# Patient Record
Sex: Female | Born: 1950 | Race: White | Hispanic: No | Marital: Married | Smoking: Former smoker
Health system: Southern US, Community
[De-identification: ages and names within clinical notes are randomized; demographics above are authoritative.]

## PROBLEM LIST (undated history)

## (undated) DIAGNOSIS — IMO0001 Reserved for inherently not codable concepts without codable children: Secondary | ICD-10-CM

## (undated) DIAGNOSIS — R519 Headache, unspecified: Secondary | ICD-10-CM

## (undated) DIAGNOSIS — M1990 Unspecified osteoarthritis, unspecified site: Secondary | ICD-10-CM

## (undated) DIAGNOSIS — Z8719 Personal history of other diseases of the digestive system: Secondary | ICD-10-CM

## (undated) DIAGNOSIS — T451X5A Adverse effect of antineoplastic and immunosuppressive drugs, initial encounter: Secondary | ICD-10-CM

## (undated) DIAGNOSIS — Z9289 Personal history of other medical treatment: Secondary | ICD-10-CM

## (undated) DIAGNOSIS — K589 Irritable bowel syndrome without diarrhea: Secondary | ICD-10-CM

## (undated) DIAGNOSIS — C3490 Malignant neoplasm of unspecified part of unspecified bronchus or lung: Secondary | ICD-10-CM

## (undated) DIAGNOSIS — Z5111 Encounter for antineoplastic chemotherapy: Secondary | ICD-10-CM

## (undated) DIAGNOSIS — F329 Major depressive disorder, single episode, unspecified: Secondary | ICD-10-CM

## (undated) DIAGNOSIS — D6481 Anemia due to antineoplastic chemotherapy: Secondary | ICD-10-CM

## (undated) DIAGNOSIS — R51 Headache: Secondary | ICD-10-CM

## (undated) DIAGNOSIS — F418 Other specified anxiety disorders: Secondary | ICD-10-CM

## (undated) DIAGNOSIS — F32A Depression, unspecified: Secondary | ICD-10-CM

## (undated) DIAGNOSIS — M353 Polymyalgia rheumatica: Secondary | ICD-10-CM

## (undated) DIAGNOSIS — J189 Pneumonia, unspecified organism: Secondary | ICD-10-CM

## (undated) DIAGNOSIS — IMO0002 Reserved for concepts with insufficient information to code with codable children: Secondary | ICD-10-CM

## (undated) DIAGNOSIS — I10 Essential (primary) hypertension: Secondary | ICD-10-CM

## (undated) DIAGNOSIS — E785 Hyperlipidemia, unspecified: Secondary | ICD-10-CM

## (undated) HISTORY — DX: Polymyalgia rheumatica: M35.3

## (undated) HISTORY — DX: Irritable bowel syndrome, unspecified: K58.9

## (undated) HISTORY — DX: Hyperlipidemia, unspecified: E78.5

## (undated) HISTORY — DX: Reserved for concepts with insufficient information to code with codable children: IMO0002

## (undated) HISTORY — DX: Anemia due to antineoplastic chemotherapy: D64.81

## (undated) HISTORY — DX: Adverse effect of antineoplastic and immunosuppressive drugs, initial encounter: T45.1X5A

## (undated) HISTORY — DX: Reserved for inherently not codable concepts without codable children: IMO0001

## (undated) HISTORY — DX: Depression, unspecified: F32.A

## (undated) HISTORY — PX: WISDOM TOOTH EXTRACTION: SHX21

## (undated) HISTORY — DX: Other specified anxiety disorders: F41.8

## (undated) HISTORY — DX: Major depressive disorder, single episode, unspecified: F32.9

## (undated) HISTORY — PX: COLONOSCOPY: SHX174

## (undated) HISTORY — DX: Encounter for antineoplastic chemotherapy: Z51.11

---

## 1998-06-07 ENCOUNTER — Other Ambulatory Visit: Admission: RE | Admit: 1998-06-07 | Discharge: 1998-06-07 | Payer: Self-pay | Admitting: Gynecology

## 1999-06-21 ENCOUNTER — Other Ambulatory Visit: Admission: RE | Admit: 1999-06-21 | Discharge: 1999-06-21 | Payer: Self-pay | Admitting: Obstetrics and Gynecology

## 2000-12-12 ENCOUNTER — Other Ambulatory Visit: Admission: RE | Admit: 2000-12-12 | Discharge: 2000-12-12 | Payer: Self-pay | Admitting: Obstetrics and Gynecology

## 2002-05-21 ENCOUNTER — Other Ambulatory Visit: Admission: RE | Admit: 2002-05-21 | Discharge: 2002-05-21 | Payer: Self-pay | Admitting: Obstetrics and Gynecology

## 2003-10-20 ENCOUNTER — Other Ambulatory Visit: Admission: RE | Admit: 2003-10-20 | Discharge: 2003-10-20 | Payer: Self-pay | Admitting: Obstetrics and Gynecology

## 2004-09-12 ENCOUNTER — Ambulatory Visit (HOSPITAL_COMMUNITY): Admission: RE | Admit: 2004-09-12 | Discharge: 2004-09-12 | Payer: Self-pay | Admitting: Gastroenterology

## 2005-01-24 ENCOUNTER — Other Ambulatory Visit: Admission: RE | Admit: 2005-01-24 | Discharge: 2005-01-24 | Payer: Self-pay | Admitting: Obstetrics and Gynecology

## 2006-05-16 ENCOUNTER — Inpatient Hospital Stay (HOSPITAL_COMMUNITY): Admission: EM | Admit: 2006-05-16 | Discharge: 2006-05-18 | Payer: Self-pay | Admitting: Emergency Medicine

## 2007-07-09 ENCOUNTER — Encounter: Admission: RE | Admit: 2007-07-09 | Discharge: 2007-07-09 | Payer: Self-pay | Admitting: Family Medicine

## 2007-07-21 ENCOUNTER — Encounter: Admission: RE | Admit: 2007-07-21 | Discharge: 2007-08-05 | Payer: Self-pay | Admitting: Family Medicine

## 2011-02-15 NOTE — Op Note (Signed)
[name], Tracy Dodson                ACCOUNT NO.:  [id number]   MEDICAL RECORD NO.:  [id number]          PATIENT TYPE:  AMB   LOCATION:  ENDO                         FACILITY:  [facility]   PHYSICIAN:  John C. Madilyn Fireman, M.D.    DATE OF BIRTH:  1951/09/14   DATE OF PROCEDURE:  09/13/2003  DATE OF DISCHARGE:                                 OPERATIVE REPORT   PROCEDURE:  Colonoscopy.   INDICATIONS FOR PROCEDURE:  Average-risk colon cancer screening.   PROCEDURE:  The patient was placed in the left lateral decubitus position  and placed on the pulse monitor with continuous low flow oxygen delivered by  nasal cannula.  She was sedated with 100 mcg IV fentanyl and 10 mg IV  Versed.  The Olympus video colonoscope is inserted into the rectum and  advanced to the cecum, confirmed by transillumination at McBurney's point  and visualization of the ileocecal valve and appendiceal orifice.  Prep was  excellent.  The cecum, ascending, transverse, descending, and sigmoid colon  all appeared normal with no masses, polyps, diverticula, or other mucosal  abnormalities.  The rectum likewise appeared normal, and retroflexed view of  the anus revealed no obvious internal hemorrhoids.  The scope was then  withdrawn, and the patient returned to the recovery room in stable  condition.  She tolerated the procedure well, and there were no immediate  complications.   IMPRESSION:  Normal colonoscopy.   PLAN:  Next colon screening by sigmoidoscopy in five years.   IMPRESSION:  Normal colonoscopy.   PLAN:  Repeat study in five years.      JCH/MEDQ  D:  09/12/2004  T:  09/12/2004  Job:  [id number]   cc:   Chales Salmon. Abigail Miyamoto, M.D.  [address]  [place]  [place] [address]  Fax: [phone]
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
## 2011-10-08 ENCOUNTER — Other Ambulatory Visit: Payer: Self-pay | Admitting: Obstetrics and Gynecology

## 2011-10-08 DIAGNOSIS — N644 Mastodynia: Secondary | ICD-10-CM

## 2011-10-14 ENCOUNTER — Ambulatory Visit
Admission: RE | Admit: 2011-10-14 | Discharge: 2011-10-14 | Disposition: A | Discharge status: Home / Self Care | Payer: PRIVATE HEALTH INSURANCE | Source: Ambulatory Visit | Attending: Obstetrics and Gynecology | Admitting: Obstetrics and Gynecology

## 2011-10-14 DIAGNOSIS — N644 Mastodynia: Secondary | ICD-10-CM

## 2011-11-07 ENCOUNTER — Other Ambulatory Visit: Payer: Self-pay | Admitting: Obstetrics and Gynecology

## 2011-11-07 ENCOUNTER — Ambulatory Visit
Admission: RE | Admit: 2011-11-07 | Discharge: 2011-11-07 | Disposition: A | Discharge status: Home / Self Care | Payer: PRIVATE HEALTH INSURANCE | Source: Ambulatory Visit | Attending: Obstetrics and Gynecology | Admitting: Obstetrics and Gynecology

## 2011-11-07 DIAGNOSIS — R0781 Pleurodynia: Secondary | ICD-10-CM

## 2012-04-17 ENCOUNTER — Ambulatory Visit (INDEPENDENT_AMBULATORY_CARE_PROVIDER_SITE_OTHER): Payer: PRIVATE HEALTH INSURANCE | Admitting: Family Medicine

## 2012-04-17 ENCOUNTER — Ambulatory Visit: Payer: PRIVATE HEALTH INSURANCE

## 2012-04-17 VITALS — BP 150/82 | HR 68 | Temp 98.4°F | Resp 16 | Ht 65.5 in | Wt 169.0 lb

## 2012-04-17 DIAGNOSIS — M25561 Pain in right knee: Secondary | ICD-10-CM

## 2012-04-17 DIAGNOSIS — M25569 Pain in unspecified knee: Secondary | ICD-10-CM

## 2012-04-17 DIAGNOSIS — S86919A Strain of unspecified muscle(s) and tendon(s) at lower leg level, unspecified leg, initial encounter: Secondary | ICD-10-CM

## 2012-04-17 MED ORDER — NABUMETONE 500 MG PO TABS: 500.0000 mg | ORAL_TABLET | Freq: Two times a day (BID) | ORAL | Status: DC

## 2012-04-17 NOTE — Patient Instructions (Signed)
Use ice 3-4 times daily for 5 days.  Relafen twice daily with food  Return if not improving.

## 2012-04-17 NOTE — Progress Notes (Signed)
Subjective: 61 year old lady with history of having been walking or stairs at home yesterday and injured her right knee. It felt like something gave way a little bit there. He continues to feel unstable to her with some pain. It's not totally consistent. She has tenderness in the back of the knee. She has not had previous knee problems like this, though she has been told she had some arthritis. She is on treatment for polymyalgia rheumatica, which is improved. She has not been feeling any popping or cracking in the knee.  Objective: Moderately overweight lady in no acute distress. When she ambulates she is able to do so with a fairly smooth gait right now, because it is not hurting her a lot right now. There is no obvious effusion of the knee. She has some moderate tenderness of the lateral aspect of the popliteal fossa on the right. The knee has no palpable effusion. No popping or clicking can be felt. The kneecap isn't focal in good position. Flexion and extension are good. She'll joint ligaments seem to be intact.  Assessment:  Right knee pain Right knee strain Possibly ruptured a baker's cyst  Plan: X-ray right knee [facility] reading (PRIMARY) by  Dr. Alwyn Ren Mild degenerative changes  Take RElafen 500 bid [place] cautious at the beach.  Return if sx persist.

## 2012-08-24 ENCOUNTER — Ambulatory Visit
Admission: RE | Admit: 2012-08-24 | Discharge: 2012-08-24 | Disposition: A | Discharge status: Home / Self Care | Payer: PRIVATE HEALTH INSURANCE | Source: Ambulatory Visit | Attending: Rheumatology | Admitting: Rheumatology

## 2012-08-24 ENCOUNTER — Other Ambulatory Visit: Payer: Self-pay | Admitting: Rheumatology

## 2012-08-24 DIAGNOSIS — J984 Other disorders of lung: Secondary | ICD-10-CM

## 2012-08-31 ENCOUNTER — Other Ambulatory Visit: Payer: Self-pay | Admitting: Rheumatology

## 2012-08-31 DIAGNOSIS — R911 Solitary pulmonary nodule: Secondary | ICD-10-CM

## 2012-09-03 ENCOUNTER — Ambulatory Visit
Admission: RE | Admit: 2012-09-03 | Discharge: 2012-09-03 | Disposition: A | Discharge status: Home / Self Care | Payer: PRIVATE HEALTH INSURANCE | Source: Ambulatory Visit | Attending: Rheumatology | Admitting: Rheumatology

## 2012-09-03 DIAGNOSIS — R911 Solitary pulmonary nodule: Secondary | ICD-10-CM

## 2012-09-03 MED ORDER — IOHEXOL 300 MG/ML  SOLN
75.0000 mL | Freq: Once | INTRAMUSCULAR | Status: AC | PRN
  Administered 2012-09-03: 75 mL via INTRAVENOUS

## 2012-09-15 ENCOUNTER — Encounter: Payer: Self-pay | Admitting: Internal Medicine

## 2012-09-16 ENCOUNTER — Ambulatory Visit (INDEPENDENT_AMBULATORY_CARE_PROVIDER_SITE_OTHER): Payer: PRIVATE HEALTH INSURANCE | Admitting: Internal Medicine

## 2012-09-16 ENCOUNTER — Encounter: Payer: Self-pay | Admitting: Internal Medicine

## 2012-09-16 VITALS — BP 130/80 | HR 81 | Temp 98.0°F | Ht 66.0 in | Wt 164.0 lb

## 2012-09-16 DIAGNOSIS — R911 Solitary pulmonary nodule: Secondary | ICD-10-CM

## 2012-09-16 DIAGNOSIS — F17200 Nicotine dependence, unspecified, uncomplicated: Secondary | ICD-10-CM

## 2012-09-16 NOTE — Patient Instructions (Addendum)
Please see patient coordinator before you leave today  to schedule PET scan and I will call when results are avaialbe  Make every effort to stop smoking completely if at all possible

## 2012-09-16 NOTE — Progress Notes (Signed)
  Subjective:    Patient ID: Tracy Dodson, female    DOB: [birth year]  MRN: [id number]  HPI   72 yowf active smoker with polymyalgia since June 2011 with increase bilateral shoulder pain leading to shoulder xray > RML lung nodule not present 11/07/11    09/16/2012 1st pulmonary eval cc min dry cough x months, worse in cold weather, no pleuritic cp, limiting sob or h/o RA or previous dx of Ca.  Sleeping ok without nocturnal  or early am exacerbation  of respiratory  c/o's or need for noct saba. Also denies any obvious fluctuation of symptoms with weather or environmental changes or other aggravating or alleviating factors except as outlined above    Review of Systems  Constitutional: Negative for fever, chills and unexpected weight change.  HENT: Negative for ear pain, nosebleeds, congestion, sore throat, rhinorrhea, sneezing, trouble swallowing, dental problem, voice change, postnasal drip and sinus pressure.   Eyes: Negative for visual disturbance.  Respiratory: Positive for cough. Negative for choking and shortness of breath.   Cardiovascular: Negative for chest pain and leg swelling.  Gastrointestinal: Negative for vomiting, abdominal pain and diarrhea.  Genitourinary: Negative for difficulty urinating.  Musculoskeletal: Positive for arthralgias.  Skin: Negative for rash.  Neurological: Negative for tremors, syncope and headaches.  Hematological: Does not bruise/bleed easily.       Objective:   Physical Exam Wt Readings from Last 3 Encounters:  04/17/12 169 lb (76.658 kg)   Anxious pleasant wf nad HEENT: nl dentition, turbinates, and orophanx. Nl external ear canals without cough reflex   NECK :  without JVD/Nodes/TM/ nl carotid upstrokes bilaterally   LUNGS: no acc muscle use, clear to A and P bilaterally without cough on insp or exp maneuvers   CV:  RRR  no s3 or murmur or increase in P2, no edema   ABD:  soft and nontender with nl excursion in the supine position. No  bruits or organomegaly, bowel sounds nl  MS:  warm without deformities, calf tenderness, cyanosis or clubbing  SKIN: warm and dry without lesions    NEURO:  alert, approp, no deficits    Ct 09/03/12 1. 1.9 CM SPICULATED RIGHT MIDDLE LOBE NODULE, HIGHLY SUSPICIOUS  FOR MALIGNANCY. TISSUE DIAGNOSIS AND / OR PET CT RECOMMENDED.  2. A precarinal node is at the upper limits of normal in size.       Assessment & Plan:

## 2012-09-17 ENCOUNTER — Encounter: Payer: Self-pay | Admitting: Internal Medicine

## 2012-09-17 NOTE — Progress Notes (Signed)
Quick Note:  Spoke with pt and notified of results per Dr. Wert. Pt verbalized understanding and denied any questions.  ______ 

## 2012-09-17 NOTE — Assessment & Plan Note (Signed)
-   See CT chest 09/03/12   This is def new from previous nl cxr 11/07/11  So is resectable lung ca until proven otherwise in a pt who appears to be excellent surgical candidate for rmlobectomy but needs to quit smoking for 2 weeks first.  Discussed in detail all the  indications, usual  risks and alternatives  relative to the benefits with patient who agrees to proceed with PET then excisional bx if Stage I - IIIa

## 2012-09-17 NOTE — Assessment & Plan Note (Signed)
Advised to quit 09/16/12 pulmonary ov - PFT's wnl 09/16/12  I reviewed the Flethcher curve with patient that basically indicates  if you quit smoking when your best day FEV1 is still well preserved (as hers is) it is highly unlikely you will progress to severe disease and informed the patient there was no medication on the market that has proven to change the curve or the likelihood of progression.  Therefore stopping smoking and maintaining abstinence is the most important aspect of care, not choice of inhalers or for that matter, doctors.   She will also need to quit smoking at least 2 weeks preop to reduce post op risk, reviewed.

## 2012-09-24 ENCOUNTER — Encounter (HOSPITAL_COMMUNITY)
Admission: RE | Admit: 2012-09-24 | Discharge: 2012-09-24 | Disposition: A | Discharge status: Home / Self Care | Payer: PRIVATE HEALTH INSURANCE | Source: Ambulatory Visit | Attending: Internal Medicine | Admitting: Internal Medicine

## 2012-09-24 ENCOUNTER — Encounter: Payer: Self-pay | Admitting: Internal Medicine

## 2012-09-24 DIAGNOSIS — R911 Solitary pulmonary nodule: Secondary | ICD-10-CM

## 2012-09-24 DIAGNOSIS — C771 Secondary and unspecified malignant neoplasm of intrathoracic lymph nodes: Secondary | ICD-10-CM | POA: Insufficient documentation

## 2012-09-24 DIAGNOSIS — D3500 Benign neoplasm of unspecified adrenal gland: Secondary | ICD-10-CM | POA: Insufficient documentation

## 2012-09-24 DIAGNOSIS — K8020 Calculus of gallbladder without cholecystitis without obstruction: Secondary | ICD-10-CM | POA: Insufficient documentation

## 2012-09-24 LAB — GLUCOSE, CAPILLARY: Glucose-Capillary: 94 mg/dL (ref 70–99)

## 2012-09-24 MED ORDER — FLUDEOXYGLUCOSE F - 18 (FDG) INJECTION
16.8000 | Freq: Once | INTRAVENOUS | Status: AC | PRN
  Administered 2012-09-24: 16.8 via INTRAVENOUS

## 2012-09-25 ENCOUNTER — Other Ambulatory Visit: Payer: Self-pay | Admitting: Emergency Medicine

## 2012-09-25 DIAGNOSIS — R911 Solitary pulmonary nodule: Secondary | ICD-10-CM

## 2012-09-28 ENCOUNTER — Encounter (HOSPITAL_COMMUNITY): Payer: Self-pay | Admitting: *Deleted

## 2012-09-28 ENCOUNTER — Telehealth: Payer: Self-pay | Admitting: Emergency Medicine

## 2012-09-28 ENCOUNTER — Encounter (HOSPITAL_COMMUNITY): Payer: Self-pay | Admitting: Pharmacy Technician

## 2012-09-28 NOTE — Telephone Encounter (Signed)
Called spoke with Jan who is requesting specific orders for pt's bronch be placed.  RB please advise, thanks.

## 2012-09-28 NOTE — Telephone Encounter (Signed)
Done

## 2012-09-29 ENCOUNTER — Ambulatory Visit (HOSPITAL_COMMUNITY): Payer: PRIVATE HEALTH INSURANCE

## 2012-09-29 ENCOUNTER — Ambulatory Visit (HOSPITAL_COMMUNITY): Payer: PRIVATE HEALTH INSURANCE | Admitting: Anesthesiology

## 2012-09-29 ENCOUNTER — Encounter (HOSPITAL_COMMUNITY): Payer: Self-pay | Admitting: *Deleted

## 2012-09-29 ENCOUNTER — Encounter (HOSPITAL_COMMUNITY): Payer: Self-pay | Admitting: Anesthesiology

## 2012-09-29 ENCOUNTER — Encounter (HOSPITAL_COMMUNITY)
Admission: RE | Disposition: A | Discharge status: Home / Self Care | Payer: Self-pay | Source: Ambulatory Visit | Attending: Emergency Medicine

## 2012-09-29 ENCOUNTER — Encounter (HOSPITAL_COMMUNITY): Payer: Self-pay | Admitting: Emergency Medicine

## 2012-09-29 ENCOUNTER — Ambulatory Visit (HOSPITAL_COMMUNITY)
Admission: RE | Admit: 2012-09-29 | Discharge: 2012-09-29 | Disposition: A | Discharge status: Home / Self Care | Payer: PRIVATE HEALTH INSURANCE | Source: Ambulatory Visit | Attending: Emergency Medicine | Admitting: Emergency Medicine

## 2012-09-29 ENCOUNTER — Other Ambulatory Visit: Payer: Self-pay

## 2012-09-29 DIAGNOSIS — R911 Solitary pulmonary nodule: Secondary | ICD-10-CM

## 2012-09-29 DIAGNOSIS — F17200 Nicotine dependence, unspecified, uncomplicated: Secondary | ICD-10-CM | POA: Insufficient documentation

## 2012-09-29 DIAGNOSIS — R599 Enlarged lymph nodes, unspecified: Secondary | ICD-10-CM

## 2012-09-29 HISTORY — PX: VIDEO BRONCHOSCOPY WITH ENDOBRONCHIAL ULTRASOUND: SHX6177

## 2012-09-29 HISTORY — DX: Unspecified osteoarthritis, unspecified site: M19.90

## 2012-09-29 HISTORY — DX: Essential (primary) hypertension: I10

## 2012-09-29 LAB — COMPREHENSIVE METABOLIC PANEL
ALT: 12 U/L (ref 0–35)
Alkaline Phosphatase: 74 U/L (ref 39–117)
BUN: 15 mg/dL (ref 6–23)
CO2: 26 mEq/L (ref 19–32)
Chloride: 97 mEq/L (ref 96–112)
GFR calc Af Amer: >90 mL/min (ref >90)
GFR calc non Af Amer: >90 mL/min (ref >90)
Glucose, Bld: 117 mg/dL — ABNORMAL HIGH (ref 70–99)
Potassium: 4 mEq/L (ref 3.5–5.1)
Sodium: 136 mEq/L (ref 135–145)
Total Bilirubin: 0.3 mg/dL (ref 0.3–1.2)
Total Protein: 7.7 g/dL (ref 6.0–8.3)

## 2012-09-29 LAB — PROTIME-INR: Prothrombin Time: 12.9 seconds (ref 11.6–15.2)

## 2012-09-29 LAB — CBC
MCH: 26.5 pg (ref 26.0–34.0)
MCV: 84.4 fL (ref 78.0–100.0)
WBC: 10.2 10*3/uL (ref 4.0–10.5)

## 2012-09-29 SURGERY — BRONCHOSCOPY, WITH EBUS
Anesthesia: General | Site: Mouth | Wound class: Clean Contaminated

## 2012-09-29 MED ORDER — FENTANYL CITRATE 0.05 MG/ML IJ SOLN
INTRAMUSCULAR | Status: DC | PRN
  Administered 2012-09-29 (×3): 50 ug via INTRAVENOUS
  Administered 2012-09-29: 25 ug via INTRAVENOUS
  Administered 2012-09-29 (×2): 50 ug via INTRAVENOUS

## 2012-09-29 MED ORDER — 0.9 % SODIUM CHLORIDE (POUR BTL) OPTIME
TOPICAL | Status: DC | PRN
  Administered 2012-09-29: 1000 mL

## 2012-09-29 MED ORDER — LIDOCAINE HCL (CARDIAC) 20 MG/ML IV SOLN
INTRAVENOUS | Status: DC | PRN
  Administered 2012-09-29: 70 mg via INTRAVENOUS
  Administered 2012-09-29: 30 mg via INTRAVENOUS

## 2012-09-29 MED ORDER — ACETAMINOPHEN 10 MG/ML IV SOLN: 1000.0000 mg | Freq: Once | INTRAVENOUS | Status: DC | PRN

## 2012-09-29 MED ORDER — NEOSTIGMINE METHYLSULFATE 1 MG/ML IJ SOLN
INTRAMUSCULAR | Status: DC | PRN
  Administered 2012-09-29: 4 mg via INTRAVENOUS

## 2012-09-29 MED ORDER — HYDROMORPHONE HCL PF 1 MG/ML IJ SOLN: 0.2500 mg | INTRAMUSCULAR | Status: DC | PRN

## 2012-09-29 MED ORDER — ONDANSETRON HCL 4 MG/2ML IJ SOLN
INTRAMUSCULAR | Status: DC | PRN
  Administered 2012-09-29: 4 mg via INTRAVENOUS

## 2012-09-29 MED ORDER — ONDANSETRON HCL 4 MG/2ML IJ SOLN: 4.0000 mg | Freq: Once | INTRAMUSCULAR | Status: DC | PRN

## 2012-09-29 MED ORDER — ROCURONIUM BROMIDE 100 MG/10ML IV SOLN
INTRAVENOUS | Status: DC | PRN
  Administered 2012-09-29: 40 mg via INTRAVENOUS

## 2012-09-29 MED ORDER — LACTATED RINGERS IV SOLN
INTRAVENOUS | Status: DC | PRN
  Administered 2012-09-29 (×2): via INTRAVENOUS

## 2012-09-29 MED ORDER — SODIUM CHLORIDE 0.9 % IJ SOLN
INTRAMUSCULAR | Status: DC | PRN
  Administered 2012-09-29: 11:00:00

## 2012-09-29 MED ORDER — EPINEPHRINE HCL 1 MG/ML IJ SOLN
INTRAMUSCULAR | Status: AC
  Filled 2012-09-29: qty 1

## 2012-09-29 MED ORDER — LACTATED RINGERS IV SOLN
INTRAVENOUS | Status: DC
  Administered 2012-09-29: 09:00:00 via INTRAVENOUS

## 2012-09-29 MED ORDER — PROPOFOL 10 MG/ML IV BOLUS
INTRAVENOUS | Status: DC | PRN
  Administered 2012-09-29: 150 mg via INTRAVENOUS

## 2012-09-29 MED ORDER — MIDAZOLAM HCL 5 MG/5ML IJ SOLN
INTRAMUSCULAR | Status: DC | PRN
  Administered 2012-09-29: 2 mg via INTRAVENOUS

## 2012-09-29 MED ORDER — GLYCOPYRROLATE 0.2 MG/ML IJ SOLN
INTRAMUSCULAR | Status: DC | PRN
  Administered 2012-09-29: .6 mg via INTRAVENOUS

## 2012-09-29 SURGICAL SUPPLY — 26 items
BRUSH CYTOL CELLEBRITY 1.5X140 (MISCELLANEOUS) IMPLANT
CANISTER SUCTION 2500CC (MISCELLANEOUS) ×2 IMPLANT
CLOTH BEACON ORANGE TIMEOUT ST (SAFETY) ×2 IMPLANT
CONT SPEC 4OZ CLIKSEAL STRL BL (MISCELLANEOUS) ×2 IMPLANT
COVER TABLE BACK 60X90 (DRAPES) ×2 IMPLANT
FILTER STRAW FLUID ASPIR (MISCELLANEOUS) ×1 IMPLANT
FORCEPS BIOP RJ4 1.8 (CUTTING FORCEPS) ×2 IMPLANT
FORCEPS BIOP SPYBITE 1.2X286 (FORCEP) ×1 IMPLANT
GLOVE BIO SURGEON STRL SZ 6.5 (GLOVE) ×1 IMPLANT
GLOVE BIOGEL M STRL SZ7.5 (GLOVE) ×2 IMPLANT
GOWN STRL NON-REIN LRG LVL3 (GOWN DISPOSABLE) ×1 IMPLANT
KIT ROOM TURNOVER OR (KITS) ×2 IMPLANT
MARKER SKIN DUAL TIP RULER LAB (MISCELLANEOUS) ×2 IMPLANT
NEEDLE BIOPSY TRANSBRONCH 21G (NEEDLE) IMPLANT
NEEDLE SYS SONOTIP II EBUSTBNA (NEEDLE) ×1 IMPLANT
NS IRRIG 1000ML POUR BTL (IV SOLUTION) ×2 IMPLANT
OIL SILICONE PENTAX (PARTS (SERVICE/REPAIRS)) ×1 IMPLANT
PAD ARMBOARD 7.5X6 YLW CONV (MISCELLANEOUS) ×4 IMPLANT
SPONGE GAUZE 4X4 12PLY (GAUZE/BANDAGES/DRESSINGS) ×2 IMPLANT
SYR 20CC LL (SYRINGE) ×2 IMPLANT
SYR 20ML ECCENTRIC (SYRINGE) ×3 IMPLANT
SYR 5ML LUER SLIP (SYRINGE) ×2 IMPLANT
SYR TB 1ML LUER SLIP (SYRINGE) ×1 IMPLANT
TOWEL OR 17X24 6PK STRL BLUE (TOWEL DISPOSABLE) IMPLANT
TRAP SPECIMEN MUCOUS 40CC (MISCELLANEOUS) ×2 IMPLANT
TUBE CONNECTING 12X1/4 (SUCTIONS) ×2 IMPLANT

## 2012-09-29 NOTE — Preoperative (Signed)
Beta Blockers   Reason not to administer Beta Blockers:Not Applicable 

## 2012-09-29 NOTE — Op Note (Signed)
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  Estimated Blood Loss: 40-50cc  Complications: There was some persistent bleeding after her Wang needle biopsy of the right upper lobe airway targeting 10R lymph node. With wedge pressure from the bronchoscope and intermittent suctioning we achieve hemostasis after approximately 10 minutes. 5-6 cc of 1:10,000 dilution epinephrine was injected onto the biopsy site.  Indications and History: Tracy Dodson is a 61 y.o. female with a solitary right upper lobe nodule and mediastinal lymphadenopathy followed by Dr. Sherene Sires.  Recommendation was made to pursue mediastinal lymph node biopsies through bronchoscopy and endobronchial ultrasound. The risks, benefits, complications, treatment options and expected outcomes were discussed with the patient.  The possibilities of pneumothorax, pneumonia, reaction to medication, pulmonary aspiration, perforation of a viscus, bleeding, failure to diagnose a condition and creating a complication requiring transfusion or operation were discussed with the patient who freely signed the consent.    Description of Procedure: The patient was examined in the preoperative area and history and data from the preprocedure consultation were reviewed. It was deemed appropriate to proceed.  The patient was taken to OR 10 at [facility], identified as Tracy Dodson and the procedure verified as Flexible Video Fiberoptic Bronchoscopy.  A Time Out was held and the above information confirmed. General anesthesia was initiated and the patient  was orally intubated. The video fiberoptic  bronchoscope was introduced via the endotracheal tube and a general inspection was performed which showed normal airways. The standard scope was then withdrawn and the endobronchial ultrasound was used to identify and characterize the peritracheal, hilar and bronchial lymph nodes. Inspection showed enlargement of pretracheal node labeled 4R. The EBUS scope could not be directed into the right upper lobe airway to find the 10R node. Using real-time ultrasound guidance Wang needle biopsies were take from Station 4R and was sent for cytology. Microforceps were used to further biopsy the 4R node through the hole created by the EBUS Wang biopsy. These were sent for pathology. The standard scope was then reintroduced and a blind Wang needle biopsy was performed in the region of the 10R node. As mentioned above there was persistent moderate bleeding following the right upper lobe Wang needle biopsy. For this reason attempts at this location were aborted and pressure was held to achieve hemostasis. 1:10,000 dilution epinephrine was utilized at this location. Bleeding had subsided by the end of the case. The bronchoscope was withdrawn. Anesthesia was reversed and the patient was taken to the PACU for recovery.   Samples: 1. Wang needle biopsies from 4R node 2. Wang needle biopsies from 10R node 3. Transbronchial biopsies from the 4R node  Plans:  The patient will be discharged from the PACU to home when recovered from anesthesia. We will review the cytology, pathology and microbiology results with the patient when they become available. Outpatient followup will be with Dr Sherene Sires or Dr Delton Coombes.    Levy Pupa, MD, PhD 09/29/2012, 11:27 AM [facility] [phone] or if no answer [phone]

## 2012-09-29 NOTE — Anesthesia Preprocedure Evaluation (Signed)
Anesthesia Evaluation  Patient identified by MRN, date of birth, ID band Patient awake    Reviewed: Allergy & Precautions, H&P , NPO status , Patient's Chart, lab work & pertinent test results  Airway Mallampati: II TM Distance: >3 FB     Dental  (+) Teeth Intact and Dental Advisory Given   Pulmonary  breath sounds clear to auscultation        Cardiovascular Rhythm:Regular     Neuro/Psych    GI/Hepatic   Endo/Other    Renal/GU      Musculoskeletal   Abdominal   Peds  Hematology   Anesthesia Other Findings   Reproductive/Obstetrics                           Anesthesia Physical Anesthesia Plan  ASA: III  Anesthesia Plan: General   Post-op Pain Management:    Induction: Intravenous  Airway Management Planned: Oral ETT  Additional Equipment:   Intra-op Plan:   Post-operative Plan: Extubation in OR  Informed Consent: I have reviewed the patients History and Physical, chart, labs and discussed the procedure including the risks, benefits and alternatives for the proposed anesthesia with the patient or authorized representative who has indicated his/her understanding and acceptance.   Dental advisory given  Plan Discussed with: CRNA and Surgeon  Anesthesia Plan Comments: (R. Middle Lobe Mass H/O smoking Htn Polymyalgia Rheumatica on prednisone   Plan GA with oral ETT  Kipp Brood, MD )        Anesthesia Quick Evaluation

## 2012-09-29 NOTE — Transfer of Care (Signed)
Immediate Anesthesia Transfer of Care Note  Patient: Tracy Dodson  Procedure(s) Performed: Procedure(s) (LRB) with comments: VIDEO BRONCHOSCOPY WITH ENDOBRONCHIAL ULTRASOUND (N/A)  Patient Location: PACU  Anesthesia Type:General  Level of Consciousness: awake, alert , oriented and patient cooperative  Airway & Oxygen Therapy: Patient Spontanous Breathing  Post-op Assessment: Report given to PACU RN  Post vital signs: Reviewed and stable  Complications: No apparent anesthesia complications

## 2012-09-29 NOTE — H&P (View-Only) (Signed)
  Subjective:    Patient ID: Tracy Dodson, female    DOB: 11/12/1950  MRN: [id number]  HPI   61 yowf active smoker with polymyalgia since June 2011 with increase bilateral shoulder pain leading to shoulder xray > RML lung nodule not present 11/07/11    09/16/2012 1st pulmonary eval cc min dry cough x months, worse in cold weather, no pleuritic cp, limiting sob or h/o RA or previous dx of Ca.  Sleeping ok without nocturnal  or early am exacerbation  of respiratory  c/o's or need for noct saba. Also denies any obvious fluctuation of symptoms with weather or environmental changes or other aggravating or alleviating factors except as outlined above    Review of Systems  Constitutional: Negative for fever, chills and unexpected weight change.  HENT: Negative for ear pain, nosebleeds, congestion, sore throat, rhinorrhea, sneezing, trouble swallowing, dental problem, voice change, postnasal drip and sinus pressure.   Eyes: Negative for visual disturbance.  Respiratory: Positive for cough. Negative for choking and shortness of breath.   Cardiovascular: Negative for chest pain and leg swelling.  Gastrointestinal: Negative for vomiting, abdominal pain and diarrhea.  Genitourinary: Negative for difficulty urinating.  Musculoskeletal: Positive for arthralgias.  Skin: Negative for rash.  Neurological: Negative for tremors, syncope and headaches.  Hematological: Does not bruise/bleed easily.       Objective:   Physical Exam Wt Readings from Last 3 Encounters:  04/17/12 169 lb (76.658 kg)   Anxious pleasant wf nad HEENT: nl dentition, turbinates, and orophanx. Nl external ear canals without cough reflex   NECK :  without JVD/Nodes/TM/ nl carotid upstrokes bilaterally   LUNGS: no acc muscle use, clear to A and P bilaterally without cough on insp or exp maneuvers   CV:  RRR  no s3 or murmur or increase in P2, no edema   ABD:  soft and nontender with nl excursion in the supine position. No  bruits or organomegaly, bowel sounds nl  MS:  warm without deformities, calf tenderness, cyanosis or clubbing  SKIN: warm and dry without lesions    NEURO:  alert, approp, no deficits    Ct 09/03/12 1. 1.9 CM SPICULATED RIGHT MIDDLE LOBE NODULE, HIGHLY SUSPICIOUS  FOR MALIGNANCY. TISSUE DIAGNOSIS AND / OR PET CT RECOMMENDED.  2. A precarinal node is at the upper limits of normal in size.       Assessment & Plan:   

## 2012-09-29 NOTE — Interval H&P Note (Signed)
PCCM Interval Note  Pt followed by Dr Sherene Sires who identified spiculated RML nodule on CT scan chest. PET scan showed that this nodule is hypermetabolic. Also with pretracheal and R mediastinal nodes that are hypermetabolic. She was referred to me for Wang needle bx's of the mediastinal nodes for dx and staging before referral for probable primary resection by TCTS.   She denies any significant changes, new problems.   Filed Vitals:   09/29/12 0726  BP: 156/92  Pulse: 95  Temp: 98.2 F (36.8 C)  Resp: 18   HEENT: OP clear, airway3 Lungs clear CV: RRR, no M Ext: no edema   Lab 09/29/12 0723  HGB 10.9*  HCT 34.7*  WBC 10.2  PLT 270    Lab 09/29/12 0723  NA 136  K 4.0  CL 97  CO2 26  GLUCOSE 117*  BUN 15  CREATININE 0.65  CALCIUM 9.7  MG --  PHOS --    Lab 09/29/12 0723  INR 0.98   Plan: Proceed with EBUS and needle bx's mediastinal nodes  Levy Pupa, MD, PhD 09/29/2012, 9:35 AM [facility] [phone] or if no answer [phone]

## 2012-09-29 NOTE — Anesthesia Postprocedure Evaluation (Signed)
  Anesthesia Post-op Note  Patient: Tracy Dodson  Procedure(s) Performed: Procedure(s) (LRB) with comments: VIDEO BRONCHOSCOPY WITH ENDOBRONCHIAL ULTRASOUND (N/A)  Patient Location: PACU  Anesthesia Type:General  Level of Consciousness: awake, alert  and oriented  Airway and Oxygen Therapy: Patient Spontanous Breathing and Patient connected to nasal cannula oxygen  Post-op Pain: none  Post-op Assessment: Post-op Vital signs reviewed, Patient's Cardiovascular Status Stable, Respiratory Function Stable and Patent Airway  Post-op Vital Signs: stable  Complications: No apparent anesthesia complications

## 2012-10-01 ENCOUNTER — Encounter (HOSPITAL_COMMUNITY): Payer: Self-pay | Admitting: Emergency Medicine

## 2012-10-01 ENCOUNTER — Telehealth: Payer: Self-pay | Admitting: Emergency Medicine

## 2012-10-01 DIAGNOSIS — C3490 Malignant neoplasm of unspecified part of unspecified bronchus or lung: Secondary | ICD-10-CM

## 2012-10-01 NOTE — Telephone Encounter (Signed)
Discussed results with the patient. I will refer her to the [facility] to make a therapeutic plan.

## 2012-10-01 NOTE — Telephone Encounter (Signed)
Called pt to discuss bx results. Left message, will try alternative #'s

## 2012-10-01 NOTE — Telephone Encounter (Signed)
Pt returned call. She says the [phone] number is best. Tracy Dodson

## 2012-10-08 ENCOUNTER — Encounter: Payer: Self-pay | Admitting: Radiation Oncology

## 2012-10-08 ENCOUNTER — Ambulatory Visit (HOSPITAL_BASED_OUTPATIENT_CLINIC_OR_DEPARTMENT_OTHER): Payer: PRIVATE HEALTH INSURANCE | Admitting: Internal Medicine

## 2012-10-08 ENCOUNTER — Encounter: Payer: Self-pay | Admitting: Internal Medicine

## 2012-10-08 ENCOUNTER — Encounter: Payer: Self-pay | Admitting: *Deleted

## 2012-10-08 ENCOUNTER — Ambulatory Visit
Admission: RE | Admit: 2012-10-08 | Discharge: 2012-10-08 | Disposition: A | Discharge status: Home / Self Care | Payer: PRIVATE HEALTH INSURANCE | Source: Ambulatory Visit | Attending: Radiation Oncology | Admitting: Radiation Oncology

## 2012-10-08 VITALS — BP 138/74 | HR 80 | Temp 98.1°F | Resp 18 | Ht 66.0 in | Wt 160.0 lb

## 2012-10-08 DIAGNOSIS — C3490 Malignant neoplasm of unspecified part of unspecified bronchus or lung: Secondary | ICD-10-CM

## 2012-10-08 DIAGNOSIS — C342 Malignant neoplasm of middle lobe, bronchus or lung: Secondary | ICD-10-CM | POA: Insufficient documentation

## 2012-10-08 DIAGNOSIS — R599 Enlarged lymph nodes, unspecified: Secondary | ICD-10-CM
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
## 2012-10-08 NOTE — Progress Notes (Unsigned)
Spoke with pt at [facility] 10/08/12.  Education/resource information given and explained. Distress and nutrition screening completed.  Questions and concerns addressed

## 2012-10-08 NOTE — Progress Notes (Signed)
[facility]  Clinical Social Work  Clinical Social Work met with patient and Systems developer at [employer] today. MD discussed patient's diagnosis and treatment plan.  Mrs. Mikesell had no additional questions after MD visit and verbalized understanding.   Mrs. Larkin is married and has two children, ages 68 and 84.  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  She states her employer is very supportive.  She has no concerns at this time. CSW explained CSW role and encouraged her to contact with any questions or concerns.  Kathrin Penner, MSW, LCSW Clinical Social Worker [facility] [phone]

## 2012-10-09 ENCOUNTER — Telehealth: Payer: Self-pay | Admitting: *Deleted

## 2012-10-09 ENCOUNTER — Encounter: Payer: Self-pay | Admitting: *Deleted

## 2012-10-09 ENCOUNTER — Telehealth: Payer: Self-pay | Admitting: Internal Medicine

## 2012-10-09 NOTE — Telephone Encounter (Signed)
l/m with 1/16 chemo class and 1/20 appts and for her to call with any ?s         [facility]

## 2012-10-09 NOTE — Telephone Encounter (Signed)
Per staff message and POF I have scheduled appts.  JMW  

## 2012-10-11 NOTE — Patient Instructions (Signed)
You are recently diagnosed with a stage IIIa non-small cell lung cancer. We discussed treatment options including concurrent chemoradiation. I will complete the staging workup. I ordered an MRI of the brain.

## 2012-10-12 ENCOUNTER — Telehealth: Payer: Self-pay | Admitting: Radiation Oncology

## 2012-10-12 ENCOUNTER — Ambulatory Visit
Admission: RE | Admit: 2012-10-12 | Discharge: 2012-10-12 | Disposition: A | Discharge status: Home / Self Care | Payer: PRIVATE HEALTH INSURANCE | Source: Ambulatory Visit | Attending: Radiation Oncology | Admitting: Radiation Oncology

## 2012-10-12 ENCOUNTER — Encounter: Payer: Self-pay | Admitting: Radiation Oncology

## 2012-10-12 VITALS — BP 143/67 | HR 82 | Temp 98.0°F | Resp 16 | Ht 66.0 in | Wt 162.0 lb

## 2012-10-12 DIAGNOSIS — R0602 Shortness of breath: Secondary | ICD-10-CM | POA: Insufficient documentation

## 2012-10-12 DIAGNOSIS — R5383 Other fatigue: Secondary | ICD-10-CM | POA: Insufficient documentation

## 2012-10-12 DIAGNOSIS — Z510 Encounter for antineoplastic radiation therapy: Secondary | ICD-10-CM | POA: Insufficient documentation

## 2012-10-12 DIAGNOSIS — X58XXXA Exposure to other specified factors, initial encounter: Secondary | ICD-10-CM | POA: Insufficient documentation

## 2012-10-12 DIAGNOSIS — S20219A Contusion of unspecified front wall of thorax, initial encounter: Secondary | ICD-10-CM | POA: Insufficient documentation

## 2012-10-12 DIAGNOSIS — C3490 Malignant neoplasm of unspecified part of unspecified bronchus or lung: Secondary | ICD-10-CM | POA: Insufficient documentation

## 2012-10-12 DIAGNOSIS — Z79899 Other long term (current) drug therapy: Secondary | ICD-10-CM | POA: Insufficient documentation

## 2012-10-12 DIAGNOSIS — R5381 Other malaise: Secondary | ICD-10-CM | POA: Insufficient documentation

## 2012-10-12 DIAGNOSIS — D649 Anemia, unspecified: Secondary | ICD-10-CM | POA: Insufficient documentation

## 2012-10-12 DIAGNOSIS — Y842 Radiological procedure and radiotherapy as the cause of abnormal reaction of the patient, or of later complication, without mention of misadventure at the time of the procedure: Secondary | ICD-10-CM | POA: Insufficient documentation

## 2012-10-12 DIAGNOSIS — K208 Other esophagitis without bleeding: Secondary | ICD-10-CM | POA: Insufficient documentation

## 2012-10-12 HISTORY — DX: Malignant neoplasm of unspecified part of unspecified bronchus or lung: C34.90

## 2012-10-12 NOTE — Telephone Encounter (Signed)
Met w patient to discuss RO billing. Pt had no financial concerns a this time.  Dx: Lung cancer 162.9   Attending Rad: JK   Rad Tx:  Daily

## 2012-10-12 NOTE — Progress Notes (Signed)
Phoned Tracy Dodson requesting she come to the clinic for financial counseling. Patient seen by [name] then, discharged home for the day.

## 2012-10-12 NOTE — Progress Notes (Signed)
Patient presents to the clinic today unaccompanied for nurse evaluation following CT/SIM. Patient alert and oriented to person, place, and time. No distress noted. Steady gait noted. Pleasant affect noted. Patient denies pain at this time. Blood pressure slightly elevated but, patient reports taking losartan/hctz as directed. Patient has no complaints at this time. Patient denies having a pacemaker. Patient denies allergies. Patient denies history of radiation therapy. Reported all findings to Dr. Roselind Messier.

## 2012-10-12 NOTE — Progress Notes (Signed)
  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   NARRATIVE:  The patient was brought to the CT Simulation planning suite.  Identity was confirmed.  All relevant records and images related to the planned course of therapy were reviewed.  The patient freely provided informed written consent to proceed with treatment after reviewing the details related to the planned course of therapy. The consent form was witnessed and verified by the simulation staff.  Then, the patient was set-up in a stable reproducible  supine position for radiation therapy.  CT images were obtained.  Surface markings were placed.  The CT images were loaded into the planning software.  Then the target and avoidance structures were contoured.  Treatment planning then occurred.  The radiation prescription was entered and confirmed.  A total of 0 complex treatment devices were fabricated. I have requested : 3D Simulation  I have requested a DVH of the following structures: GTv, spinal cord lungs and esophagus.  I have ordered:{dose calc.  PLAN:  The patient will receive 50.4 Gy in 28 fractions.  She is receiving a preoperative dose of radiation therapy.    Special treatment procedure note  Patient will be receiving radiosensitizing chemotherapy during her chest radiation therapy. Given the increased potential for toxicities as well as the necessity for close monitoring of the patient and blood work, this constitutes a special treatment procedure.  ________________________________   Billie Lade, PhD, MD

## 2012-10-13 ENCOUNTER — Encounter: Payer: Self-pay | Admitting: Internal Medicine

## 2012-10-13 ENCOUNTER — Inpatient Hospital Stay (HOSPITAL_COMMUNITY): Admission: RE | Admit: 2012-10-13 | Payer: PRIVATE HEALTH INSURANCE | Source: Ambulatory Visit

## 2012-10-13 ENCOUNTER — Telehealth: Payer: Self-pay | Admitting: *Deleted

## 2012-10-13 NOTE — Progress Notes (Unsigned)
10/13/2012  Left necessary information concerning the request for a brain mri for this patient on confidential voice mail for med cost.  Brain mri has been moved from tomorrow until 10/15/2012 by the revenue cycle [facility] until pre-cert can be obtained.  Bonita Quin [phone]

## 2012-10-13 NOTE — Telephone Encounter (Signed)
Received MyChart message from pt wanting to know when her MRI of brain is scheduled.   Spoke with Beth in MRI dept.   Beth stated she would contact pt to schedule appt.  Gave Beth all pt's contact phone numbers.

## 2012-10-14 ENCOUNTER — Encounter: Payer: Self-pay | Admitting: Internal Medicine

## 2012-10-14 ENCOUNTER — Telehealth: Payer: Self-pay | Admitting: *Deleted

## 2012-10-14 ENCOUNTER — Ambulatory Visit (INDEPENDENT_AMBULATORY_CARE_PROVIDER_SITE_OTHER): Payer: PRIVATE HEALTH INSURANCE | Admitting: Internal Medicine

## 2012-10-14 VITALS — BP 118/76 | HR 91 | Temp 97.8°F | Ht 66.0 in | Wt 165.0 lb

## 2012-10-14 DIAGNOSIS — C3490 Malignant neoplasm of unspecified part of unspecified bronchus or lung: Secondary | ICD-10-CM

## 2012-10-14 DIAGNOSIS — F17200 Nicotine dependence, unspecified, uncomplicated: Secondary | ICD-10-CM

## 2012-10-14 MED ORDER — PROCHLORPERAZINE MALEATE 10 MG PO TABS: 10.0000 mg | ORAL_TABLET | Freq: Four times a day (QID) | ORAL | Status: DC | PRN

## 2012-10-14 NOTE — Progress Notes (Signed)
10/14/2012  I have left two detailed messages on a nurse voice mail with this patient's clinical information and insurance information.  The first message on 10/13/2012 @ 4:15pm and again today @ 10:27 am.  This brain mri will have to be pre-cert before it can be performed.  This is just FYI of my attempts to get this procedure approved.  I will keep you inform as to my process.  This patient has [employer].  Bonita Quin [phone]

## 2012-10-14 NOTE — Progress Notes (Signed)
  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  09/29/12 > fob neg airways dz/ Pos nsc IIIb   10/14/2012 f/u ov/Jaleen Finch plan on starting chemo and RT 10/19/12 by Shirline Frees and [facility]. Min cough, no limiting sob  Sleeping ok without nocturnal  or early am exacerbation  of respiratory  c/o's or need for noct saba. Also denies any obvious fluctuation of symptoms with weather or environmental changes or other aggravating or alleviating factors except as outlined above          Objective:   Physical Exam Wt Readings from Last 3 Encounters:  10/14/12 165 lb (74.844 kg)  10/12/12 162 lb (73.483 kg)  10/08/12 160 lb (72.576 kg)  04/17/12 169   Min anxious pleasant wf nad  HEENT: nl dentition, turbinates, and orophanx. Nl external ear canals without cough reflex   NECK :  without JVD/Nodes/TM/ nl carotid upstrokes bilaterally   LUNGS: no acc muscle use, clear to A and P bilaterally without cough on insp or exp maneuvers   CV:  RRR  no s3 or murmur or increase in P2, no edema   ABD:  soft and nontender with nl excursion in the supine position. No bruits or organomegaly, bowel sounds nl  MS:  warm without deformities, calf tenderness, cyanosis or clubbing  SKIN: warm and dry without lesions        Ct 09/03/12 1. 1.9 CM SPICULATED RIGHT MIDDLE LOBE NODULE, HIGHLY SUSPICIOUS  FOR MALIGNANCY. TISSUE DIAGNOSIS AND / OR PET CT RECOMMENDED.  2. A precarinal node is at the upper limits of normal in size.       Assessment & Plan:

## 2012-10-14 NOTE — Progress Notes (Signed)
ok 

## 2012-10-14 NOTE — Assessment & Plan Note (Signed)
Congratulated on stopping smoking

## 2012-10-14 NOTE — Telephone Encounter (Signed)
Pt sent email regarding a rx for nausea.  Rx for compazine called into [facility] on [address].  Informed her how to take nausea medication prn.  She verbalized understanding.  SLJ

## 2012-10-14 NOTE — Assessment & Plan Note (Signed)
FOB 09/29/12 [employer] nl> IIIA NSC reviewed with pt. She has nl lung function and has now quit smoking so unless she has new pulmonary symptoms no need for further pft's or pulmonary rx or f/u.

## 2012-10-14 NOTE — Patient Instructions (Signed)
Follow up in pulmonary clinic as needed for short of breath or cp with breathing or cough

## 2012-10-15 ENCOUNTER — Ambulatory Visit (HOSPITAL_COMMUNITY): Admission: RE | Admit: 2012-10-15 | Payer: PRIVATE HEALTH INSURANCE | Source: Ambulatory Visit

## 2012-10-15 ENCOUNTER — Encounter: Payer: Self-pay | Admitting: *Deleted

## 2012-10-15 ENCOUNTER — Telehealth: Payer: Self-pay | Admitting: Internal Medicine

## 2012-10-15 ENCOUNTER — Ambulatory Visit (HOSPITAL_COMMUNITY)
Admission: RE | Admit: 2012-10-15 | Discharge: 2012-10-15 | Disposition: A | Discharge status: Home / Self Care | Payer: PRIVATE HEALTH INSURANCE | Source: Ambulatory Visit | Attending: Internal Medicine | Admitting: Internal Medicine

## 2012-10-15 ENCOUNTER — Other Ambulatory Visit: Payer: PRIVATE HEALTH INSURANCE

## 2012-10-15 DIAGNOSIS — C3490 Malignant neoplasm of unspecified part of unspecified bronchus or lung: Secondary | ICD-10-CM | POA: Insufficient documentation

## 2012-10-15 MED ORDER — GADOBENATE DIMEGLUMINE 529 MG/ML IV SOLN
15.0000 mL | Freq: Once | INTRAVENOUS | Status: AC | PRN
  Administered 2012-10-15: 15 mL via INTRAVENOUS

## 2012-10-15 NOTE — Telephone Encounter (Signed)
l/m confirming 1/20 appts

## 2012-10-19 ENCOUNTER — Encounter: Payer: Self-pay | Admitting: Radiation Oncology

## 2012-10-19 ENCOUNTER — Other Ambulatory Visit: Payer: PRIVATE HEALTH INSURANCE | Admitting: Lab

## 2012-10-19 ENCOUNTER — Other Ambulatory Visit (HOSPITAL_BASED_OUTPATIENT_CLINIC_OR_DEPARTMENT_OTHER): Payer: PRIVATE HEALTH INSURANCE | Admitting: Lab

## 2012-10-19 ENCOUNTER — Ambulatory Visit
Admission: RE | Admit: 2012-10-19 | Discharge: 2012-10-19 | Disposition: A | Discharge status: Home / Self Care | Payer: PRIVATE HEALTH INSURANCE | Source: Ambulatory Visit | Attending: Radiation Oncology | Admitting: Radiation Oncology

## 2012-10-19 ENCOUNTER — Ambulatory Visit (HOSPITAL_BASED_OUTPATIENT_CLINIC_OR_DEPARTMENT_OTHER): Payer: PRIVATE HEALTH INSURANCE

## 2012-10-19 ENCOUNTER — Other Ambulatory Visit: Payer: Self-pay | Admitting: Internal Medicine

## 2012-10-19 VITALS — BP 129/67 | HR 58 | Temp 98.4°F

## 2012-10-19 VITALS — BP 140/66 | HR 66 | Temp 97.1°F | Resp 16 | Wt 162.6 lb

## 2012-10-19 DIAGNOSIS — C3490 Malignant neoplasm of unspecified part of unspecified bronchus or lung: Secondary | ICD-10-CM

## 2012-10-19 DIAGNOSIS — C342 Malignant neoplasm of middle lobe, bronchus or lung: Secondary | ICD-10-CM

## 2012-10-19 DIAGNOSIS — Z5111 Encounter for antineoplastic chemotherapy: Secondary | ICD-10-CM

## 2012-10-19 LAB — COMPREHENSIVE METABOLIC PANEL (CC13)
BUN: 14 mg/dL (ref 7.0–26.0)
CO2: 26 mEq/L (ref 22–29)
Calcium: 10.2 mg/dL (ref 8.4–10.4)
Chloride: 100 mEq/L (ref 98–107)
Creatinine: 0.8 mg/dL (ref 0.6–1.1)
Glucose: 100 mg/dl — ABNORMAL HIGH (ref 70–99)

## 2012-10-19 LAB — CBC WITH DIFFERENTIAL/PLATELET
Basophils Absolute: 0 10*3/uL (ref 0.0–0.1)
Eosinophils Absolute: 0 10*3/uL (ref 0.0–0.5)
HCT: 32.8 % — ABNORMAL LOW (ref 34.8–46.6)
LYMPH%: 10.5 % — ABNORMAL LOW (ref 14.0–49.7)
MCHC: 30.2 g/dL — ABNORMAL LOW (ref 31.5–36.0)
MONO#: 2.5 10*3/uL — ABNORMAL HIGH (ref 0.1–0.9)
NEUT#: 9.8 10*3/uL — ABNORMAL HIGH (ref 1.5–6.5)
NEUT%: 71.4 % (ref 38.4–76.8)
Platelets: 231 10*3/uL (ref 145–400)
WBC: 13.8 10*3/uL — ABNORMAL HIGH (ref 3.9–10.3)

## 2012-10-19 MED ORDER — DEXAMETHASONE SODIUM PHOSPHATE 4 MG/ML IJ SOLN
20.0000 mg | Freq: Once | INTRAMUSCULAR | Status: AC
  Administered 2012-10-19: 20 mg via INTRAVENOUS

## 2012-10-19 MED ORDER — SODIUM CHLORIDE 0.9 % IV SOLN
263.6000 mg | Freq: Once | INTRAVENOUS | Status: AC
  Administered 2012-10-19: 260 mg via INTRAVENOUS
  Filled 2012-10-19: qty 26

## 2012-10-19 MED ORDER — DIPHENHYDRAMINE HCL 50 MG/ML IJ SOLN
50.0000 mg | Freq: Once | INTRAMUSCULAR | Status: AC
  Administered 2012-10-19: 50 mg via INTRAVENOUS

## 2012-10-19 MED ORDER — SODIUM CHLORIDE 0.9 % IV SOLN: Freq: Once | INTRAVENOUS | Status: DC

## 2012-10-19 MED ORDER — LORAZEPAM 2 MG/ML IJ SOLN
0.5000 mg | Freq: Once | INTRAMUSCULAR | Status: AC
  Administered 2012-10-19: 0.5 mg via INTRAVENOUS

## 2012-10-19 MED ORDER — PACLITAXEL CHEMO INJECTION 300 MG/50ML
45.0000 mg/m2 | Freq: Once | INTRAVENOUS | Status: AC
  Administered 2012-10-19: 84 mg via INTRAVENOUS
  Filled 2012-10-19: qty 14

## 2012-10-19 MED ORDER — ONDANSETRON 16 MG/50ML IVPB (CHCC)
16.0000 mg | Freq: Once | INTRAVENOUS | Status: AC
  Administered 2012-10-19: 16 mg via INTRAVENOUS

## 2012-10-19 MED ORDER — FAMOTIDINE IN NACL 20-0.9 MG/50ML-% IV SOLN
20.0000 mg | Freq: Once | INTRAVENOUS | Status: AC
  Administered 2012-10-19: 20 mg via INTRAVENOUS

## 2012-10-19 NOTE — Progress Notes (Signed)
Taxol started at rate of 75mL/hr.  Titrated per protocol.  RN at chairside for first of infusion.  No complaints.  Vitals WNL.

## 2012-10-19 NOTE — Progress Notes (Signed)
Patient presented to the clinic today requesting to be seen by Dr. Roselind Messier. Patient reports that on Saturday morning she woke up and notice a bruise on her left upper chest. Patient palpated area and felt a nodule/thickness under the bruise. Patient concerned about this new presentation. Patient denies injuring this area. Patient is alert and oriented to person, place, and time. No distress noted. Steady gait noted. Pleasant affect noted. Patient denies pain at this time even in the area of concern. Patient denies nausea, vomiting, headache, dizziness or diarrhea. Vital WDL. Reported all findings to Dr. Roselind Messier.

## 2012-10-19 NOTE — Progress Notes (Signed)
  Radiation Oncology         [phone] ________________________________  Name: Tracy Dodson MRN: [id number]  Date: 10/19/2012  DOB: [birth year]  Simulation Verification Note  Status: outpatient  NARRATIVE: The patient was brought to the treatment unit and placed in the planned treatment position. The clinical setup was verified. Then port films were obtained and uploaded to the radiation oncology medical record software.  The treatment beams were carefully compared against the planned radiation fields. The position location and shape of the radiation fields was reviewed. They targeted volume of tissue appears to be appropriately covered by the radiation beams. Organs at risk appear to be excluded as planned.  Based on my personal review, I approved the simulation verification. The patient's treatment will proceed as planned.  -----------------------------------  Billie Lade, PhD, MD

## 2012-10-19 NOTE — Patient Instructions (Addendum)
[facility] Discharge Instructions for Patients Receiving Chemotherapy  Today you received the following chemotherapy agents: Taxol and Carboplatin.  To help prevent nausea and vomiting after your treatment, we encourage you to take your nausea medication.    If you develop nausea and vomiting that is not controlled by your nausea medication, call the clinic.  BELOW ARE SYMPTOMS THAT SHOULD BE REPORTED IMMEDIATELY:  *FEVER GREATER THAN 100.5 F  *CHILLS WITH OR WITHOUT FEVER  NAUSEA AND VOMITING THAT IS NOT CONTROLLED WITH YOUR NAUSEA MEDICATION  *UNUSUAL SHORTNESS OF BREATH  *UNUSUAL BRUISING OR BLEEDING  TENDERNESS IN MOUTH AND THROAT WITH OR WITHOUT PRESENCE OF ULCERS  *URINARY PROBLEMS  *BOWEL PROBLEMS  UNUSUAL RASH Items with * indicate a potential emergency and should be followed up as soon as possible.  One of the nurses will contact you 24 hours after your treatment. Please let the nurse know about any problems that you may have experienced. Feel free to call the clinic you have any questions or concerns. The clinic phone number is [phone].   Paclitaxel injection What is this medicine? PACLITAXEL (PAK li TAX el) is a chemotherapy drug. It targets fast dividing cells, like cancer cells, and causes these cells to die. This medicine is used to treat ovarian cancer, breast cancer, and other cancers. This medicine may be used for other purposes; ask your health care provider or pharmacist if you have questions. What should I tell my health care provider before I take this medicine? They need to know if you have any of these conditions: -blood disorders -irregular heartbeat -infection (especially a virus infection such as chickenpox, cold sores, or herpes) -liver disease -previous or ongoing radiation therapy -an unusual or allergic reaction to paclitaxel, alcohol, polyoxyethylated castor oil, other chemotherapy agents, other medicines, foods, dyes,  or preservatives -pregnant or trying to get pregnant -breast-feeding How should I use this medicine? This drug is given as an infusion into a vein. It is administered in a hospital or clinic by a specially trained health care professional. Talk to your pediatrician regarding the use of this medicine in children. Special care may be needed. Overdosage: If you think you have taken too much of this medicine contact a poison control center or emergency room at once. NOTE: This medicine is only for you. Do not share this medicine with others. What if I miss a dose? It is important not to miss your dose. Call your doctor or health care professional if you are unable to keep an appointment. What may interact with this medicine? Do not take this medicine with any of the following medications: -disulfiram -metronidazole This medicine may also interact with the following medications: -cyclosporine -dexamethasone -diazepam -ketoconazole -medicines to increase blood counts like filgrastim, pegfilgrastim, sargramostim -other chemotherapy drugs like cisplatin, doxorubicin, epirubicin, etoposide, teniposide, vincristine -quinidine -testosterone -vaccines -verapamil Talk to your doctor or health care professional before taking any of these medicines: -acetaminophen -aspirin -ibuprofen -ketoprofen -naproxen This list may not describe all possible interactions. Give your health care provider a list of all the medicines, herbs, non-prescription drugs, or dietary supplements you use. Also tell them if you smoke, drink alcohol, or use illegal drugs. Some items may interact with your medicine. What should I watch for while using this medicine? Your condition will be monitored carefully while you are receiving this medicine. You will need important blood work done while you are taking this medicine. This drug may make you feel generally unwell. This is not  uncommon, as chemotherapy can affect healthy  cells as well as cancer cells. Report any side effects. Continue your course of treatment even though you feel ill unless your doctor tells you to stop. In some cases, you may be given additional medicines to help with side effects. Follow all directions for their use. Call your doctor or health care professional for advice if you get a fever, chills or sore throat, or other symptoms of a cold or flu. Do not treat yourself. This drug decreases your body's ability to fight infections. Try to avoid being around people who are sick. This medicine may increase your risk to bruise or bleed. Call your doctor or health care professional if you notice any unusual bleeding. Be careful brushing and flossing your teeth or using a toothpick because you may get an infection or bleed more easily. If you have any dental work done, tell your dentist you are receiving this medicine. Avoid taking products that contain aspirin, acetaminophen, ibuprofen, naproxen, or ketoprofen unless instructed by your doctor. These medicines may hide a fever. Do not become pregnant while taking this medicine. Women should inform their doctor if they wish to become pregnant or think they might be pregnant. There is a potential for serious side effects to an unborn child. Talk to your health care professional or pharmacist for more information. Do not breast-feed an infant while taking this medicine. Men are advised not to father a child while receiving this medicine. What side effects may I notice from receiving this medicine? Side effects that you should report to your doctor or health care professional as soon as possible: -allergic reactions like skin rash, itching or hives, swelling of the face, lips, or tongue -low blood counts - This drug may decrease the number of white blood cells, red blood cells and platelets. You may be at increased risk for infections and bleeding. -signs of infection - fever or chills, cough, sore throat, pain  or difficulty passing urine -signs of decreased platelets or bleeding - bruising, pinpoint red spots on the skin, black, tarry stools, nosebleeds -signs of decreased red blood cells - unusually weak or tired, fainting spells, lightheadedness -breathing problems -chest pain -high or low blood pressure -mouth sores -nausea and vomiting -pain, swelling, redness or irritation at the injection site -pain, tingling, numbness in the hands or feet -slow or irregular heartbeat -swelling of the ankle, feet, hands Side effects that usually do not require medical attention (report to your doctor or health care professional if they continue or are bothersome): -bone pain -complete hair loss including hair on your head, underarms, pubic hair, eyebrows, and eyelashes -changes in the color of fingernails -diarrhea -loosening of the fingernails -loss of appetite -muscle or joint pain -red flush to skin -sweating This list may not describe all possible side effects. Call your doctor for medical advice about side effects. You may report side effects to FDA at 1-800-FDA-1088. Where should I keep my medicine? This drug is given in a hospital or clinic and will not be stored at home. NOTE: This sheet is a summary. It may not cover all possible information. If you have questions about this medicine, talk to your doctor, pharmacist, or health care provider.  2013, Elsevier/[employer] Standard. (08/29/2008 11:54:26 AM)  Carboplatin injection What is this medicine? CARBOPLATIN (KAR boe pla tin) is a chemotherapy drug. It targets fast dividing cells, like cancer cells, and causes these cells to die. This medicine is used to treat ovarian cancer and many other  cancers. This medicine may be used for other purposes; ask your health care provider or pharmacist if you have questions. What should I tell my health care provider before I take this medicine? They need to know if you have any of these conditions: -blood  disorders -hearing problems -kidney disease -recent or ongoing radiation therapy -an unusual or allergic reaction to carboplatin, cisplatin, other chemotherapy, other medicines, foods, dyes, or preservatives -pregnant or trying to get pregnant -breast-feeding How should I use this medicine? This drug is usually given as an infusion into a vein. It is administered in a hospital or clinic by a specially trained health care professional. Talk to your pediatrician regarding the use of this medicine in children. Special care may be needed. Overdosage: If you think you have taken too much of this medicine contact a poison control center or emergency room at once. NOTE: This medicine is only for you. Do not share this medicine with others. What if I miss a dose? It is important not to miss a dose. Call your doctor or health care professional if you are unable to keep an appointment. What may interact with this medicine? -medicines for seizures -medicines to increase blood counts like filgrastim, pegfilgrastim, sargramostim -some antibiotics like amikacin, gentamicin, neomycin, streptomycin, tobramycin -vaccines Talk to your doctor or health care professional before taking any of these medicines: -acetaminophen -aspirin -ibuprofen -ketoprofen -naproxen This list may not describe all possible interactions. Give your health care provider a list of all the medicines, herbs, non-prescription drugs, or dietary supplements you use. Also tell them if you smoke, drink alcohol, or use illegal drugs. Some items may interact with your medicine. What should I watch for while using this medicine? Your condition will be monitored carefully while you are receiving this medicine. You will need important blood work done while you are taking this medicine. This drug may make you feel generally unwell. This is not uncommon, as chemotherapy can affect healthy cells as well as cancer cells. Report any side effects.  Continue your course of treatment even though you feel ill unless your doctor tells you to stop. In some cases, you may be given additional medicines to help with side effects. Follow all directions for their use. Call your doctor or health care professional for advice if you get a fever, chills or sore throat, or other symptoms of a cold or flu. Do not treat yourself. This drug decreases your body's ability to fight infections. Try to avoid being around people who are sick. This medicine may increase your risk to bruise or bleed. Call your doctor or health care professional if you notice any unusual bleeding. Be careful brushing and flossing your teeth or using a toothpick because you may get an infection or bleed more easily. If you have any dental work done, tell your dentist you are receiving this medicine. Avoid taking products that contain aspirin, acetaminophen, ibuprofen, naproxen, or ketoprofen unless instructed by your doctor. These medicines may hide a fever. Do not become pregnant while taking this medicine. Women should inform their doctor if they wish to become pregnant or think they might be pregnant. There is a potential for serious side effects to an unborn child. Talk to your health care professional or pharmacist for more information. Do not breast-feed an infant while taking this medicine. What side effects may I notice from receiving this medicine? Side effects that you should report to your doctor or health care professional as soon as possible: -allergic reactions like  skin rash, itching or hives, swelling of the face, lips, or tongue -signs of infection - fever or chills, cough, sore throat, pain or difficulty passing urine -signs of decreased platelets or bleeding - bruising, pinpoint red spots on the skin, black, tarry stools, nosebleeds -signs of decreased red blood cells - unusually weak or tired, fainting spells, lightheadedness -breathing problems -changes in  hearing -changes in vision -chest pain -high blood pressure -low blood counts - This drug may decrease the number of white blood cells, red blood cells and platelets. You may be at increased risk for infections and bleeding. -nausea and vomiting -pain, swelling, redness or irritation at the injection site -pain, tingling, numbness in the hands or feet -problems with balance, talking, walking -trouble passing urine or change in the amount of urine Side effects that usually do not require medical attention (report to your doctor or health care professional if they continue or are bothersome): -hair loss -loss of appetite -metallic taste in the mouth or changes in taste This list may not describe all possible side effects. Call your doctor for medical advice about side effects. You may report side effects to FDA at 1-800-FDA-1088. Where should I keep my medicine? This drug is given in a hospital or clinic and will not be stored at home. NOTE: This sheet is a summary. It may not cover all possible information. If you have questions about this medicine, talk to your doctor, pharmacist, or health care provider.  2012, [employer]vier/Gold Standard. (12/22/2007 2:38:05 PM)

## 2012-10-19 NOTE — Progress Notes (Signed)
   Department of Radiation Oncology  Phone:  [phone] Fax:        [phone]   Treatment note  Patient presented today for her port films in preparation for starting radiation therapy tomorrow. She was seen at the nurse's station after you should notice some bruising in the left upper chest region.  She denies any trauma to this area. On exam there is an approximate 3-4 cm area of bruising with a small hematoma approximately 1 cm in size deep in the soft tissues of the left upper chest region. Patient will continue to follow this area and let us know if it progresses. She actually feels this area is decreased in size over the weekend.  the patient will begin her chemotherapy today and return tomorrow for her first radiation treatment.  -----------------------------------  Billie Lade, PhD, MD

## 2012-10-20 ENCOUNTER — Other Ambulatory Visit: Payer: Self-pay | Admitting: Certified Registered Nurse Anesthetist

## 2012-10-20 ENCOUNTER — Telehealth: Payer: Self-pay | Admitting: *Deleted

## 2012-10-20 ENCOUNTER — Ambulatory Visit
Admission: RE | Admit: 2012-10-20 | Discharge: 2012-10-20 | Disposition: A | Discharge status: Home / Self Care | Payer: PRIVATE HEALTH INSURANCE | Source: Ambulatory Visit | Attending: Radiation Oncology | Admitting: Radiation Oncology

## 2012-10-20 VITALS — BP 127/68 | HR 65 | Temp 98.7°F | Wt 163.9 lb

## 2012-10-20 DIAGNOSIS — C3490 Malignant neoplasm of unspecified part of unspecified bronchus or lung: Secondary | ICD-10-CM

## 2012-10-20 MED ORDER — RADIAPLEXRX EX GEL
Freq: Once | CUTANEOUS | Status: AC
  Administered 2012-10-20: 1 via TOPICAL

## 2012-10-20 NOTE — Addendum Note (Signed)
Encounter addended by: Tessa Lerner, RN on: 10/20/2012  1:22 PM<BR>     Documentation filed: Inpatient MAR

## 2012-10-20 NOTE — Progress Notes (Signed)
Patient here for routine weekly assessment of radiation treatment to right lung.Reviewed routine of clinic and side effects related to skincare, shortness of breath, cough,esophagitis and fatigue.Given Radiation Therapy and You Booklet to read.Patient currently gets chemotherapy weekly on Monday.Doing well today.Denies pain.Continues to work through concurrent treatment.

## 2012-10-20 NOTE — Telephone Encounter (Signed)
Left VM for patient to call with status or any questions.

## 2012-10-20 NOTE — Progress Notes (Signed)
[facility]    Radiation Oncology [address] [place]     Maryln Gottron, M.D. [place], [place] [address]               Billie Lade, M.D., Ph.D. Phone: [phone]      Molli Hazard A. Kathrynn Running, M.D. Fax: [phone]      Radene Gunning, M.D., Ph.D.         Lurline Hare, M.D.         Grayland Jack, M.D Weekly Treatment Management Note  Name: Tracy Dodson     MRN: [id number]        CSN: [id number] Date: 10/20/2012      DOB: 1951-08-29  CC: [name], MD         Larwance Sachs    Status: Outpatient  Diagnosis: The encounter diagnosis was Lung cancer.  Current Dose: 1.8 Gy  Current Fraction: 1/28  Planned Dose: 50.4 Gy  Narrative: Tracy Dodson was seen today for weekly treatment management. The chart was checked and CBCT  were reviewed. She is tolerating her treatments well thus far. The patient received her first treatment with chemotherapy yesterday and tolerated this well in addition.  Review of patient's allergies indicates no known allergies.  Current Outpatient Prescriptions  Medication Sig Dispense Refill  . acetaminophen (TYLENOL ARTHRITIS PAIN) 650 MG CR tablet Take 1,300 mg by mouth 3 (three) times daily.      [name] b complex vitamins tablet Take 1 tablet by mouth daily.      [name] BIOTIN 5000 PO Take 1 tablet by mouth daily.      . Calcium Carbonate-Vitamin D (CALCIUM 600+D) 600-200 MG-UNIT TABS Take 1 tablet by mouth daily.      . citalopram (CELEXA) 20 MG tablet Take 30 mg by mouth daily. Patient uses 1 and one-half tablets.      [name] glucosamine-chondroitin 500-400 MG tablet Take 1 tablet by mouth daily.      [name] LORazepam (ATIVAN) 0.5 MG tablet Take 0.5 mg by mouth 2 (two) times daily.      [name] losartan-hydrochlorothiazide (HYZAAR) 100-25 MG per tablet Take 1 tablet by mouth daily.      . Milk Thistle 1000 MG CAPS Take 1 capsule by mouth daily.      . nicotine polacrilex (COMMIT) 2 MG lozenge Place 2 mg inside cheek as needed.      . Omega-3 Fatty Acids (FISH  OIL) 1000 MG CAPS Take 1 capsule by mouth daily.      . predniSONE (STERAPRED UNI-PAK) 5 MG TABS Take 5 mg by mouth every morning.      . PredniSONE 1 MG TBEC Take 2 tablets by mouth at bedtime.       . prochlorperazine (COMPAZINE) 10 MG tablet Take 1 tablet (10 mg total) by mouth every 6 (six) hours as needed.  30 tablet  0  . simvastatin (ZOCOR) 40 MG tablet Take 40 mg by mouth every evening.       Current Facility-Administered Medications  Medication Dose Route Frequency Provider Last Rate Last Dose  . hyaluronate sodium (RADIAPLEXRX) gel   Topical Once Billie Lade, MD       Labs:  Lab Results  Component Value Date   WBC 13.8* 10/19/2012   HGB 9.9* 10/19/2012   HCT 32.8* 10/19/2012   MCV 85.4 10/19/2012   PLT 231 10/19/2012   Lab Results  Component Value Date   CREATININE 0.8 10/19/2012   BUN 14.0 10/19/2012  NA 139 10/19/2012   K 3.7 10/19/2012   CL 100 10/19/2012   CO2 26 10/19/2012   Lab Results  Component Value Date   ALT 14 10/19/2012   AST 22 10/19/2012   BILITOT 0.60 10/19/2012    Physical Examination:  weight is 163 lb 14.4 oz (74.345 kg). Her temperature is 98.7 F (37.1 C). Her blood pressure is 127/68 and her pulse is 65. Her oxygen saturation is 98%.    Wt Readings from Last 3 Encounters:  10/20/12 163 lb 14.4 oz (74.345 kg)  10/19/12 162 lb 9.6 oz (73.755 kg)  10/14/12 165 lb (74.844 kg)     Lungs - Normal respiratory effort, chest expands symmetrically. Lungs are clear to auscultation, no crackles or wheezes.  Heart has regular rhythm and rate  Abdomen is soft and non tender with normal bowel sounds  Assessment:  Patient tolerating treatments well  Plan: Continue treatment per original radiation prescription  in a preoperative fashion.

## 2012-10-21 ENCOUNTER — Ambulatory Visit
Admission: RE | Admit: 2012-10-21 | Discharge: 2012-10-21 | Disposition: A | Discharge status: Home / Self Care | Payer: PRIVATE HEALTH INSURANCE | Source: Ambulatory Visit | Attending: Radiation Oncology | Admitting: Radiation Oncology

## 2012-10-22 ENCOUNTER — Ambulatory Visit
Admission: RE | Admit: 2012-10-22 | Discharge: 2012-10-22 | Disposition: A | Discharge status: Home / Self Care | Payer: PRIVATE HEALTH INSURANCE | Source: Ambulatory Visit | Attending: Radiation Oncology | Admitting: Radiation Oncology

## 2012-10-22 ENCOUNTER — Telehealth: Payer: Self-pay | Admitting: Radiation Oncology

## 2012-10-22 NOTE — Telephone Encounter (Signed)
[employer] assessments forms given to rad onc nurse (Sam) for clinical review, updates and then forward to Dr. Roselind Messier for completion and sign.   Maretta Los, RN<BSN<OCN Ph: [phone] Fx: [phone]

## 2012-10-22 NOTE — Progress Notes (Signed)
[employer] paperwork brought to this Clinical research associate by Merla Riches for review. Reviewed paperwork and all appears correct based upon my knowledge. Handed paperwork off to Dr. Antony Blackbird for review and final signature.

## 2012-10-23 ENCOUNTER — Ambulatory Visit
Admission: RE | Admit: 2012-10-23 | Discharge: 2012-10-23 | Disposition: A | Discharge status: Home / Self Care | Payer: PRIVATE HEALTH INSURANCE | Source: Ambulatory Visit | Attending: Radiation Oncology | Admitting: Radiation Oncology

## 2012-10-23 ENCOUNTER — Telehealth: Payer: Self-pay | Admitting: Radiation Oncology

## 2012-10-23 NOTE — Telephone Encounter (Signed)
[employer] assessments forms faxed today to: [phone]    Maretta Los, RN<BSN<OCN  Ph: [phone]     **COPY SCANNED**

## 2012-10-26 ENCOUNTER — Ambulatory Visit (HOSPITAL_BASED_OUTPATIENT_CLINIC_OR_DEPARTMENT_OTHER): Payer: PRIVATE HEALTH INSURANCE

## 2012-10-26 ENCOUNTER — Encounter: Payer: Self-pay | Admitting: Physician Assistant

## 2012-10-26 ENCOUNTER — Other Ambulatory Visit (HOSPITAL_BASED_OUTPATIENT_CLINIC_OR_DEPARTMENT_OTHER): Payer: PRIVATE HEALTH INSURANCE | Admitting: Lab

## 2012-10-26 ENCOUNTER — Ambulatory Visit
Admission: RE | Admit: 2012-10-26 | Discharge: 2012-10-26 | Disposition: A | Discharge status: Home / Self Care | Payer: PRIVATE HEALTH INSURANCE | Source: Ambulatory Visit | Attending: Radiation Oncology | Admitting: Radiation Oncology

## 2012-10-26 ENCOUNTER — Telehealth: Payer: Self-pay | Admitting: Internal Medicine

## 2012-10-26 ENCOUNTER — Telehealth: Payer: Self-pay | Admitting: *Deleted

## 2012-10-26 ENCOUNTER — Ambulatory Visit (HOSPITAL_BASED_OUTPATIENT_CLINIC_OR_DEPARTMENT_OTHER): Payer: PRIVATE HEALTH INSURANCE | Admitting: Physician Assistant

## 2012-10-26 VITALS — BP 126/65 | HR 83 | Temp 98.9°F | Resp 18 | Ht 66.0 in | Wt 161.2 lb

## 2012-10-26 DIAGNOSIS — C3490 Malignant neoplasm of unspecified part of unspecified bronchus or lung: Secondary | ICD-10-CM

## 2012-10-26 DIAGNOSIS — Z5111 Encounter for antineoplastic chemotherapy: Secondary | ICD-10-CM

## 2012-10-26 DIAGNOSIS — C342 Malignant neoplasm of middle lobe, bronchus or lung: Secondary | ICD-10-CM

## 2012-10-26 LAB — CBC WITH DIFFERENTIAL/PLATELET
BASO%: 0.1 % (ref 0.0–2.0)
EOS%: 0 % (ref 0.0–7.0)
HCT: 28.3 % — ABNORMAL LOW (ref 34.8–46.6)
LYMPH%: 15.2 % (ref 14.0–49.7)
MCH: 25.8 pg (ref 25.1–34.0)
MCHC: 30.4 g/dL — ABNORMAL LOW (ref 31.5–36.0)
MONO#: 1.1 10*3/uL — ABNORMAL HIGH (ref 0.1–0.9)
NEUT%: 69.3 % (ref 38.4–76.8)
Platelets: 238 10*3/uL (ref 145–400)
RBC: 3.33 10*6/uL — ABNORMAL LOW (ref 3.70–5.45)
WBC: 7.2 10*3/uL (ref 3.9–10.3)
lymph#: 1.1 10*3/uL (ref 0.9–3.3)
nRBC: 0 % (ref 0–0)

## 2012-10-26 LAB — COMPREHENSIVE METABOLIC PANEL (CC13)
ALT: 15 U/L (ref 0–55)
AST: 19 U/L (ref 5–34)
Alkaline Phosphatase: 69 U/L (ref 40–150)
BUN: 19.8 mg/dL (ref 7.0–26.0)
Creatinine: 0.8 mg/dL (ref 0.6–1.1)

## 2012-10-26 MED ORDER — PACLITAXEL CHEMO INJECTION 300 MG/50ML
45.0000 mg/m2 | Freq: Once | INTRAVENOUS | Status: AC
  Administered 2012-10-26: 84 mg via INTRAVENOUS
  Filled 2012-10-26: qty 14

## 2012-10-26 MED ORDER — SODIUM CHLORIDE 0.9 % IV SOLN
Freq: Once | INTRAVENOUS | Status: AC
  Administered 2012-10-26: 15:00:00 via INTRAVENOUS

## 2012-10-26 MED ORDER — FAMOTIDINE IN NACL 20-0.9 MG/50ML-% IV SOLN
20.0000 mg | Freq: Once | INTRAVENOUS | Status: AC
  Administered 2012-10-26: 20 mg via INTRAVENOUS

## 2012-10-26 MED ORDER — DEXAMETHASONE SODIUM PHOSPHATE 4 MG/ML IJ SOLN
20.0000 mg | Freq: Once | INTRAMUSCULAR | Status: AC
  Administered 2012-10-26: 20 mg via INTRAVENOUS

## 2012-10-26 MED ORDER — ONDANSETRON 16 MG/50ML IVPB (CHCC)
16.0000 mg | Freq: Once | INTRAVENOUS | Status: AC
  Administered 2012-10-26: 16 mg via INTRAVENOUS

## 2012-10-26 MED ORDER — DIPHENHYDRAMINE HCL 50 MG/ML IJ SOLN
25.0000 mg | Freq: Once | INTRAMUSCULAR | Status: AC
  Administered 2012-10-26: 25 mg via INTRAVENOUS

## 2012-10-26 MED ORDER — SODIUM CHLORIDE 0.9 % IV SOLN
220.0000 mg | Freq: Once | INTRAVENOUS | Status: AC
  Administered 2012-10-26: 220 mg via INTRAVENOUS
  Filled 2012-10-26: qty 22

## 2012-10-26 NOTE — Patient Instructions (Addendum)
[facility] Discharge Instructions for Patients Receiving Chemotherapy  Today you received the following chemotherapy agents Taxol/Carboplatin To help prevent nausea and vomiting after your treatment, we encourage you to take your nausea medication as prescribed.  If you develop nausea and vomiting that is not controlled by your nausea medication, call the clinic. If it is after clinic hours your family physician or the after hours number for the clinic or go to the Emergency Department.   BELOW ARE SYMPTOMS THAT SHOULD BE REPORTED IMMEDIATELY:  *FEVER GREATER THAN 100.5 F  *CHILLS WITH OR WITHOUT FEVER  NAUSEA AND VOMITING THAT IS NOT CONTROLLED WITH YOUR NAUSEA MEDICATION  *UNUSUAL SHORTNESS OF BREATH  *UNUSUAL BRUISING OR BLEEDING  TENDERNESS IN MOUTH AND THROAT WITH OR WITHOUT PRESENCE OF ULCERS  *URINARY PROBLEMS  *BOWEL PROBLEMS  UNUSUAL RASH Items with * indicate a potential emergency and should be followed up as soon as possible.  One of the nurses will contact you 24 hours after your treatment. Please let the nurse know about any problems that you may have experienced. Feel free to call the clinic you have any questions or concerns. The clinic phone number is [phone].   I have been informed and understand all the instructions given to me. I know to contact the clinic, my physician, or go to the Emergency Department if any problems should occur. I do not have any questions at this time, but understand that I may call the clinic during office hours   should I have any questions or need assistance in obtaining follow up care.    __________________________________________  _____________  __________ Signature of Patient or Authorized Representative            Date                   Time    __________________________________________ Nurse's Signature    
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
## 2012-10-26 NOTE — Telephone Encounter (Signed)
Per staff message and POF I have scheduled appts.  JMW  

## 2012-10-26 NOTE — Telephone Encounter (Signed)
gv and printed appt schedule for Jan and Feb...emailed michelle to add tx

## 2012-10-26 NOTE — Progress Notes (Signed)
Patient states the last time she received her infusion she had "jumpy" legs and was given Ativan to counteract the effect. Patient asked if she could take less benadryl today. Tiana Loft, PA notified ok to give 25 mg benadryl IV instead of 50 mg Benadryl IV.

## 2012-10-26 NOTE — Patient Instructions (Addendum)
Continue with your course of concurrent chemoradiation as scheduled Followup in 3 weeks 

## 2012-10-27 ENCOUNTER — Encounter: Payer: Self-pay | Admitting: Radiation Oncology

## 2012-10-27 ENCOUNTER — Ambulatory Visit
Admission: RE | Admit: 2012-10-27 | Discharge: 2012-10-27 | Disposition: A | Discharge status: Home / Self Care | Payer: PRIVATE HEALTH INSURANCE | Source: Ambulatory Visit | Attending: Radiation Oncology | Admitting: Radiation Oncology

## 2012-10-27 VITALS — BP 133/80 | HR 85 | Resp 16 | Wt 163.3 lb

## 2012-10-27 DIAGNOSIS — C3490 Malignant neoplasm of unspecified part of unspecified bronchus or lung: Secondary | ICD-10-CM

## 2012-10-27 NOTE — Progress Notes (Signed)
[facility]    Radiation Oncology [address] [place]     Maryln Gottron, M.D. [place], [place] [address]               Billie Lade, M.D., Ph.D. Phone: [phone]      Molli Hazard A. Kathrynn Running, M.D. Fax: [phone]      Radene Gunning, M.D., Ph.D.         Lurline Hare, M.D.         Grayland Jack, M.D Weekly Treatment Management Note  Name: Tracy Dodson     MRN: [id number]        CSN: [id number] Date: 10/27/2012      DOB: Jul 31, 1951  CC: [name], MD         Larwance Sachs    Status: Outpatient  Diagnosis: The encounter diagnosis was Lung cancer.  Current Dose: 10.8 Gy  Current Fraction: 6  Planned Dose: 50.4 Gy  Narrative: Tracy Dodson was seen today for weekly treatment management. The chart was checked and CBCT  were reviewed. She continues to tolerate her radiation therapy well without any side effects. She received radiosensitizing chemotherapy yesterday and also tolerated this well.  Review of patient's allergies indicates no known allergies.  Current Outpatient Prescriptions  Medication Sig Dispense Refill  . acetaminophen (TYLENOL ARTHRITIS PAIN) 650 MG CR tablet Take 1,300 mg by mouth 3 (three) times daily.      [name] b complex vitamins tablet Take 1 tablet by mouth daily.      [name] BIOTIN 5000 PO Take 1 tablet by mouth daily.      . Calcium Carbonate-Vitamin D (CALCIUM 600+D) 600-200 MG-UNIT TABS Take 1 tablet by mouth daily.      . citalopram (CELEXA) 20 MG tablet Take 30 mg by mouth daily. Patient uses 1 and one-half tablets.      [name] glucosamine-chondroitin 500-400 MG tablet Take 1 tablet by mouth daily.      [name] LORazepam (ATIVAN) 0.5 MG tablet Take 0.5 mg by mouth 2 (two) times daily.      [name] losartan-hydrochlorothiazide (HYZAAR) 100-25 MG per tablet Take 1 tablet by mouth daily.      . Milk Thistle 1000 MG CAPS Take 1 capsule by mouth daily.      . nicotine polacrilex (COMMIT) 2 MG lozenge Place 2 mg inside cheek as needed.      . Omega-3 Fatty Acids  (FISH OIL) 1000 MG CAPS Take 1 capsule by mouth daily.      . predniSONE (STERAPRED UNI-PAK) 5 MG TABS Take 5 mg by mouth every morning.      . PredniSONE 1 MG TBEC Take 2 tablets by mouth at bedtime.       . prochlorperazine (COMPAZINE) 10 MG tablet Take 1 tablet (10 mg total) by mouth every 6 (six) hours as needed.  30 tablet  0  . simvastatin (ZOCOR) 40 MG tablet Take 40 mg by mouth every evening.       Labs:  Lab Results  Component Value Date   WBC 7.2 10/26/2012   HGB 8.6* 10/26/2012   HCT 28.3* 10/26/2012   MCV 85.0 10/26/2012   PLT 238 10/26/2012   Lab Results  Component Value Date   CREATININE 0.8 10/26/2012   BUN 19.8 10/26/2012   NA 137 10/26/2012   K 3.6 10/26/2012   CL 101 10/26/2012   CO2 27 10/26/2012   Lab Results  Component Value Date   ALT 15 10/26/2012  AST 19 10/26/2012   BILITOT 0.34 10/26/2012    Physical Examination:  weight is 163 lb 4.8 oz (74.072 kg). Her blood pressure is 133/80 and her pulse is 85. Her respiration is 16 and oxygen saturation is 100%.    Wt Readings from Last 3 Encounters:  10/27/12 163 lb 4.8 oz (74.072 kg)  10/26/12 161 lb 3.2 oz (73.12 kg)  10/20/12 163 lb 14.4 oz (74.345 kg)     Lungs - Normal respiratory effort, chest expands symmetrically. Lungs are clear to auscultation, no crackles or wheezes.  Heart has regular rhythm and rate  Abdomen is soft and non tender with normal bowel sounds  Assessment:  Patient tolerating treatments well  Plan: Continue treatment per original radiation prescription

## 2012-10-27 NOTE — Progress Notes (Signed)
Patient presents to the clinic today unaccompanied for PUT with Dr. Roselind Messier. Patient alert and oriented to person, place, and time. No distress noted. Steady gait noted. Pleasant affect noted. Patient denies pain at this time. Patient reports that shortness of breath is no worse than prior to starting treatment. Patient denies a cough. Patient denies a sore throat or painful swallowing. Patient denies esophagitis. Patient reports dry mouth despite using biotene. Patient denies skin changes within treatment field. Reported all findings to Dr. Roselind Messier.

## 2012-10-28 ENCOUNTER — Ambulatory Visit
Admission: RE | Admit: 2012-10-28 | Discharge: 2012-10-28 | Disposition: A | Discharge status: Home / Self Care | Payer: PRIVATE HEALTH INSURANCE | Source: Ambulatory Visit | Attending: Radiation Oncology | Admitting: Radiation Oncology

## 2012-10-29 ENCOUNTER — Ambulatory Visit
Admission: RE | Admit: 2012-10-29 | Discharge: 2012-10-29 | Disposition: A | Discharge status: Home / Self Care | Payer: PRIVATE HEALTH INSURANCE | Source: Ambulatory Visit | Attending: Radiation Oncology | Admitting: Radiation Oncology

## 2012-10-30 ENCOUNTER — Ambulatory Visit
Admission: RE | Admit: 2012-10-30 | Discharge: 2012-10-30 | Disposition: A | Discharge status: Home / Self Care | Payer: PRIVATE HEALTH INSURANCE | Source: Ambulatory Visit | Attending: Radiation Oncology | Admitting: Radiation Oncology

## 2012-11-02 ENCOUNTER — Ambulatory Visit (HOSPITAL_BASED_OUTPATIENT_CLINIC_OR_DEPARTMENT_OTHER): Payer: PRIVATE HEALTH INSURANCE

## 2012-11-02 ENCOUNTER — Other Ambulatory Visit (HOSPITAL_BASED_OUTPATIENT_CLINIC_OR_DEPARTMENT_OTHER): Payer: PRIVATE HEALTH INSURANCE | Admitting: Lab

## 2012-11-02 ENCOUNTER — Ambulatory Visit
Admission: RE | Admit: 2012-11-02 | Discharge: 2012-11-02 | Disposition: A | Discharge status: Home / Self Care | Payer: PRIVATE HEALTH INSURANCE | Source: Ambulatory Visit | Attending: Radiation Oncology | Admitting: Radiation Oncology

## 2012-11-02 VITALS — BP 116/69 | HR 68 | Temp 97.1°F | Resp 18

## 2012-11-02 DIAGNOSIS — C3490 Malignant neoplasm of unspecified part of unspecified bronchus or lung: Secondary | ICD-10-CM

## 2012-11-02 DIAGNOSIS — C342 Malignant neoplasm of middle lobe, bronchus or lung: Secondary | ICD-10-CM

## 2012-11-02 DIAGNOSIS — Z5111 Encounter for antineoplastic chemotherapy: Secondary | ICD-10-CM

## 2012-11-02 LAB — CBC WITH DIFFERENTIAL/PLATELET
BASO%: 0.3 % (ref 0.0–2.0)
EOS%: 0.3 % (ref 0.0–7.0)
HGB: 8.4 g/dL — ABNORMAL LOW (ref 11.6–15.9)
MCH: 26.4 pg (ref 25.1–34.0)
MCHC: 30.5 g/dL — ABNORMAL LOW (ref 31.5–36.0)
MONO#: 0.8 10*3/uL (ref 0.1–0.9)
RDW: 17.4 % — ABNORMAL HIGH (ref 11.2–14.5)
WBC: 3.6 10*3/uL — ABNORMAL LOW (ref 3.9–10.3)
lymph#: 0.6 10*3/uL — ABNORMAL LOW (ref 0.9–3.3)

## 2012-11-02 LAB — COMPREHENSIVE METABOLIC PANEL (CC13)
ALT: 17 U/L (ref 0–55)
AST: 18 U/L (ref 5–34)
Albumin: 3.6 g/dL (ref 3.5–5.0)
Calcium: 8.9 mg/dL (ref 8.4–10.4)
Chloride: 101 mEq/L (ref 98–107)
Potassium: 3.5 mEq/L (ref 3.5–5.1)
Sodium: 137 mEq/L (ref 136–145)
Total Protein: 6.5 g/dL (ref 6.4–8.3)

## 2012-11-02 MED ORDER — FAMOTIDINE IN NACL 20-0.9 MG/50ML-% IV SOLN
20.0000 mg | Freq: Once | INTRAVENOUS | Status: AC
  Administered 2012-11-02: 20 mg via INTRAVENOUS

## 2012-11-02 MED ORDER — PACLITAXEL CHEMO INJECTION 300 MG/50ML
45.0000 mg/m2 | Freq: Once | INTRAVENOUS | Status: AC
  Administered 2012-11-02: 84 mg via INTRAVENOUS
  Filled 2012-11-02: qty 14

## 2012-11-02 MED ORDER — ONDANSETRON 16 MG/50ML IVPB (CHCC)
16.0000 mg | Freq: Once | INTRAVENOUS | Status: AC
  Administered 2012-11-02: 16 mg via INTRAVENOUS

## 2012-11-02 MED ORDER — SODIUM CHLORIDE 0.9 % IV SOLN
Freq: Once | INTRAVENOUS | Status: AC
  Administered 2012-11-02: 15:00:00 via INTRAVENOUS

## 2012-11-02 MED ORDER — DEXAMETHASONE SODIUM PHOSPHATE 4 MG/ML IJ SOLN
20.0000 mg | Freq: Once | INTRAMUSCULAR | Status: AC
  Administered 2012-11-02: 20 mg via INTRAVENOUS

## 2012-11-02 MED ORDER — DIPHENHYDRAMINE HCL 50 MG/ML IJ SOLN
25.0000 mg | Freq: Once | INTRAMUSCULAR | Status: AC
  Administered 2012-11-02: 25 mg via INTRAVENOUS

## 2012-11-02 MED ORDER — DIPHENHYDRAMINE HCL 50 MG/ML IJ SOLN: 50.0000 mg | Freq: Once | INTRAMUSCULAR | Status: DC

## 2012-11-02 MED ORDER — SODIUM CHLORIDE 0.9 % IV SOLN
220.0000 mg | Freq: Once | INTRAVENOUS | Status: AC
  Administered 2012-11-02: 220 mg via INTRAVENOUS
  Filled 2012-11-02: qty 22

## 2012-11-02 NOTE — Patient Instructions (Addendum)
Cancer Center Discharge Instructions for Patients Receiving Chemotherapy  Today you received the following chemotherapy agents :  Taxol,  Carboplatin.  To help prevent nausea and vomiting after your treatment, we encourage you to take your nausea medication as instructed by your physician.    If you develop nausea and vomiting that is not controlled by your nausea medication, call the clinic. If it is after clinic hours your family physician or the after hours number for the clinic or go to the Emergency Department.   BELOW ARE SYMPTOMS THAT SHOULD BE REPORTED IMMEDIATELY:  *FEVER GREATER THAN 100.5 F  *CHILLS WITH OR WITHOUT FEVER  NAUSEA AND VOMITING THAT IS NOT CONTROLLED WITH YOUR NAUSEA MEDICATION  *UNUSUAL SHORTNESS OF BREATH  *UNUSUAL BRUISING OR BLEEDING  TENDERNESS IN MOUTH AND THROAT WITH OR WITHOUT PRESENCE OF ULCERS  *URINARY PROBLEMS  *BOWEL PROBLEMS  UNUSUAL RASH Items with * indicate a potential emergency and should be followed up as soon as possible.  One of the nurses will contact you 24 hours after your treatment. Please let the nurse know about any problems that you may have experienced. Feel free to call the clinic you have any questions or concerns. The clinic phone number is [phone].   I have been informed and understand all the instructions given to me. I know to contact the clinic, my physician, or go to the Emergency Department if any problems should occur. I do not have any questions at this time, but understand that I may call the clinic during office hours   should I have any questions or need assistance in obtaining follow up care.    __________________________________________  _____________  __________ Signature of Patient or Authorized Representative            Date                   Time    __________________________________________ Nurse's Signature    

## 2012-11-03 ENCOUNTER — Ambulatory Visit
Admission: RE | Admit: 2012-11-03 | Discharge: 2012-11-03 | Disposition: A | Discharge status: Home / Self Care | Payer: PRIVATE HEALTH INSURANCE | Source: Ambulatory Visit | Attending: Radiation Oncology | Admitting: Radiation Oncology

## 2012-11-03 VITALS — BP 122/67 | HR 75 | Temp 98.3°F | Wt 163.3 lb

## 2012-11-03 DIAGNOSIS — C3490 Malignant neoplasm of unspecified part of unspecified bronchus or lung: Secondary | ICD-10-CM

## 2012-11-03 NOTE — Progress Notes (Signed)
[facility]    Radiation Oncology [address] [place]     Maryln Gottron, M.D. [place], [place] [address]               Billie Lade, M.D., Ph.D. Phone: [phone]      Molli Hazard A. Kathrynn Running, M.D. Fax: [phone]      Radene Gunning, M.D., Ph.D.         Lurline Hare, M.D.         Grayland Jack, M.D Weekly Treatment Management Note  Name: Tracy Dodson     MRN: [id number]        CSN: [id number] Date: 11/03/2012      DOB: 28-Nov-1950  CC: [name], MD         Larwance Sachs    Status: Outpatient  Diagnosis: The encounter diagnosis was Lung cancer.  Current Dose: 19.8 Gy  Current Fraction: 11  Planned Dose: 50.4 Gy  Narrative: Tracy Dodson was seen today for weekly treatment management. The chart was checked and CBCT  were reviewed. She continues to tolerate the treatments well this time. She works her usual full schedule.  She has noticed some sensitivity with swallowing but no actual pain or difficulty with swallowing.  Review of patient's allergies indicates no known allergies.  Current Outpatient Prescriptions  Medication Sig Dispense Refill  . acetaminophen (TYLENOL ARTHRITIS PAIN) 650 MG CR tablet Take 1,300 mg by mouth 3 (three) times daily.      [name] b complex vitamins tablet Take 1 tablet by mouth daily.      [name] BIOTIN 5000 PO Take 1 tablet by mouth daily.      . Calcium Carbonate-Vitamin D (CALCIUM 600+D) 600-200 MG-UNIT TABS Take 1 tablet by mouth daily.      . citalopram (CELEXA) 20 MG tablet Take 30 mg by mouth daily. Patient uses 1 and one-half tablets.      [name] glucosamine-chondroitin 500-400 MG tablet Take 1 tablet by mouth daily.      [name] LORazepam (ATIVAN) 0.5 MG tablet Take 0.5 mg by mouth 2 (two) times daily.      [name] losartan-hydrochlorothiazide (HYZAAR) 100-25 MG per tablet Take 1 tablet by mouth daily.      . Milk Thistle 1000 MG CAPS Take 1 capsule by mouth daily.      . nicotine polacrilex (COMMIT) 2 MG lozenge Place 2 mg inside cheek as needed.       . Omega-3 Fatty Acids (FISH OIL) 1000 MG CAPS Take 1 capsule by mouth daily.      . predniSONE (STERAPRED UNI-PAK) 5 MG TABS Take 5 mg by mouth every morning.      . PredniSONE 1 MG TBEC Take 2 tablets by mouth at bedtime.       . prochlorperazine (COMPAZINE) 10 MG tablet Take 1 tablet (10 mg total) by mouth every 6 (six) hours as needed.  30 tablet  0  . simvastatin (ZOCOR) 40 MG tablet Take 40 mg by mouth every evening.       Labs:  Lab Results  Component Value Date   WBC 3.6* 11/02/2012   HGB 8.4* 11/02/2012   HCT 27.5* 11/02/2012   MCV 86.5 11/02/2012   PLT 220 11/02/2012   Lab Results  Component Value Date   CREATININE 0.7 11/02/2012   BUN 15.4 11/02/2012   NA 137 11/02/2012   K 3.5 11/02/2012   CL 101 11/02/2012   CO2 26 11/02/2012   Lab Results  Component  Value Date   ALT 17 11/02/2012   AST 18 11/02/2012   BILITOT 0.30 11/02/2012    Physical Examination:  weight is 163 lb 4.8 oz (74.072 kg). Her temperature is 98.3 F (36.8 C). Her blood pressure is 122/67 and her pulse is 75. Her oxygen saturation is 100%.    Wt Readings from Last 3 Encounters:  11/03/12 163 lb 4.8 oz (74.072 kg)  10/27/12 163 lb 4.8 oz (74.072 kg)  10/26/12 161 lb 3.2 oz (73.12 kg)    The oral cavity is moist without secondary infection. Lungs - Normal respiratory effort, chest expands symmetrically. Lungs are clear to auscultation, no crackles or wheezes.  Heart has regular rhythm and rate  Abdomen is soft and non tender with normal bowel sounds  Assessment:  Patient tolerating treatments well  Plan: Continue treatment per original radiation prescription

## 2012-11-03 NOTE — Progress Notes (Signed)
Patient for routine weekly assessment of right lung radiation.Has completed 11 of 28 treatments thus far.Has weekly chemotherapy on Monday and is tolerating well.Denies pain, shortness of breath or cough but does have some mild fatigue.Denies any skin changes thus far but does have moisturizer to apply if needed.Appetite down somewhat but weight maintained.Has metallic taste from chemo.Uses biotene

## 2012-11-04 ENCOUNTER — Ambulatory Visit
Admission: RE | Admit: 2012-11-04 | Discharge: 2012-11-04 | Disposition: A | Discharge status: Home / Self Care | Payer: PRIVATE HEALTH INSURANCE | Source: Ambulatory Visit | Attending: Radiation Oncology | Admitting: Radiation Oncology

## 2012-11-05 ENCOUNTER — Telehealth: Payer: Self-pay | Admitting: *Deleted

## 2012-11-05 ENCOUNTER — Ambulatory Visit
Admission: RE | Admit: 2012-11-05 | Discharge: 2012-11-05 | Disposition: A | Discharge status: Home / Self Care | Payer: PRIVATE HEALTH INSURANCE | Source: Ambulatory Visit | Attending: Radiation Oncology | Admitting: Radiation Oncology

## 2012-11-05 ENCOUNTER — Other Ambulatory Visit: Payer: Self-pay | Admitting: Radiation Oncology

## 2012-11-05 MED ORDER — SUCRALFATE 1 GM/10ML PO SUSP: 1.0000 g | Freq: Four times a day (QID) | ORAL | Status: DC

## 2012-11-05 NOTE — Telephone Encounter (Addendum)
Returned Ms. Keatley's phone call. She is experiencing discomfort when she swallows and notes this in her throat and her mid-chest region.  She denies having any white patches or spots on her tongue,buccal mucosa, roof of mouth, or back of throat. She has already adjusted her diet with softer foods, and room temperature foods and liquids to decrease her discomfort.  She is taking Tums presently.  Will inform Dr. Roselind Messier regarding above so that she can obtain whatever prescription medication he deems appropriate.

## 2012-11-05 NOTE — Telephone Encounter (Deleted)
Returned Tracy Dodson's call and she stated

## 2012-11-06 ENCOUNTER — Ambulatory Visit
Admission: RE | Admit: 2012-11-06 | Discharge: 2012-11-06 | Disposition: A | Discharge status: Home / Self Care | Payer: PRIVATE HEALTH INSURANCE | Source: Ambulatory Visit | Attending: Radiation Oncology | Admitting: Radiation Oncology

## 2012-11-06 NOTE — Progress Notes (Signed)
Spoke with patient in treatment area. Patient reports that she continues to experience discomfort when swallowing. No white patches or spot indicative of thrust noted on tongue, buccal mucosa, roof of mouth, or back of throat. Encouraged patient to take her time eating and chew her food well. Advise patient on Carafate script. Informed patient that Dr. Roselind Messier has already call the prescription into her pharmacy. Encouraged the patient to contact staff with needs. Patient verbalized understanding of all reviewed.

## 2012-11-09 ENCOUNTER — Ambulatory Visit (HOSPITAL_BASED_OUTPATIENT_CLINIC_OR_DEPARTMENT_OTHER): Payer: PRIVATE HEALTH INSURANCE

## 2012-11-09 ENCOUNTER — Other Ambulatory Visit (HOSPITAL_BASED_OUTPATIENT_CLINIC_OR_DEPARTMENT_OTHER): Payer: PRIVATE HEALTH INSURANCE | Admitting: Lab

## 2012-11-09 ENCOUNTER — Ambulatory Visit
Admission: RE | Admit: 2012-11-09 | Discharge: 2012-11-09 | Disposition: A | Discharge status: Home / Self Care | Payer: PRIVATE HEALTH INSURANCE | Source: Ambulatory Visit | Attending: Radiation Oncology | Admitting: Radiation Oncology

## 2012-11-09 VITALS — BP 131/73 | HR 91 | Temp 98.0°F

## 2012-11-09 DIAGNOSIS — C3490 Malignant neoplasm of unspecified part of unspecified bronchus or lung: Secondary | ICD-10-CM

## 2012-11-09 DIAGNOSIS — C342 Malignant neoplasm of middle lobe, bronchus or lung: Secondary | ICD-10-CM

## 2012-11-09 DIAGNOSIS — Z5111 Encounter for antineoplastic chemotherapy: Secondary | ICD-10-CM

## 2012-11-09 LAB — COMPREHENSIVE METABOLIC PANEL (CC13)
AST: 21 U/L (ref 5–34)
Alkaline Phosphatase: 59 U/L (ref 40–150)
BUN: 15.7 mg/dL (ref 7.0–26.0)
Creatinine: 0.8 mg/dL (ref 0.6–1.1)
Total Bilirubin: 0.54 mg/dL (ref 0.20–1.20)

## 2012-11-09 LAB — CBC WITH DIFFERENTIAL/PLATELET
Basophils Absolute: 0 10*3/uL (ref 0.0–0.1)
EOS%: 0 % (ref 0.0–7.0)
HCT: 28.5 % — ABNORMAL LOW (ref 34.8–46.6)
HGB: 9.2 g/dL — ABNORMAL LOW (ref 11.6–15.9)
MCH: 27.5 pg (ref 25.1–34.0)
MCHC: 32.2 g/dL (ref 31.5–36.0)
MCV: 85.3 fL (ref 79.5–101.0)
MONO%: 21.7 % — ABNORMAL HIGH (ref 0.0–14.0)
NEUT%: 64.8 % (ref 38.4–76.8)

## 2012-11-09 MED ORDER — ONDANSETRON 16 MG/50ML IVPB (CHCC)
16.0000 mg | Freq: Once | INTRAVENOUS | Status: AC
  Administered 2012-11-09: 16 mg via INTRAVENOUS

## 2012-11-09 MED ORDER — SODIUM CHLORIDE 0.9 % IV SOLN
Freq: Once | INTRAVENOUS | Status: AC
  Administered 2012-11-09: 14:00:00 via INTRAVENOUS

## 2012-11-09 MED ORDER — PACLITAXEL CHEMO INJECTION 300 MG/50ML
45.0000 mg/m2 | Freq: Once | INTRAVENOUS | Status: AC
  Administered 2012-11-09: 84 mg via INTRAVENOUS
  Filled 2012-11-09: qty 14

## 2012-11-09 MED ORDER — DEXAMETHASONE SODIUM PHOSPHATE 4 MG/ML IJ SOLN
20.0000 mg | Freq: Once | INTRAMUSCULAR | Status: AC
  Administered 2012-11-09: 20 mg via INTRAVENOUS

## 2012-11-09 MED ORDER — CARBOPLATIN CHEMO INJECTION 450 MG/45ML
263.6000 mg | Freq: Once | INTRAVENOUS | Status: AC
  Administered 2012-11-09: 260 mg via INTRAVENOUS
  Filled 2012-11-09: qty 26

## 2012-11-09 MED ORDER — FAMOTIDINE IN NACL 20-0.9 MG/50ML-% IV SOLN
20.0000 mg | Freq: Once | INTRAVENOUS | Status: AC
  Administered 2012-11-09: 20 mg via INTRAVENOUS

## 2012-11-09 MED ORDER — DIPHENHYDRAMINE HCL 50 MG/ML IJ SOLN
25.0000 mg | Freq: Once | INTRAMUSCULAR | Status: AC
  Administered 2012-11-09: 25 mg via INTRAVENOUS

## 2012-11-09 NOTE — Patient Instructions (Addendum)
[facility] Discharge Instructions for Patients Receiving Chemotherapy  Today you received the following chemotherapy agents Taxol and Carbolpatin.  To help prevent nausea and vomiting after your treatment, we encourage you to take your nausea medication. Begin taking your nausea medication as often as prescribed for by Dr. Arbutus Ped.    If you develop nausea and vomiting that is not controlled by your nausea medication, call the clinic. If it is after clinic hours your family physician or the after hours number for the clinic or go to the Emergency Department.   BELOW ARE SYMPTOMS THAT SHOULD BE REPORTED IMMEDIATELY:  *FEVER GREATER THAN 100.5 F  *CHILLS WITH OR WITHOUT FEVER  NAUSEA AND VOMITING THAT IS NOT CONTROLLED WITH YOUR NAUSEA MEDICATION  *UNUSUAL SHORTNESS OF BREATH  *UNUSUAL BRUISING OR BLEEDING  TENDERNESS IN MOUTH AND THROAT WITH OR WITHOUT PRESENCE OF ULCERS  *URINARY PROBLEMS  *BOWEL PROBLEMS  UNUSUAL RASH Items with * indicate a potential emergency and should be followed up as soon as possible.  One of the nurses will contact you 24 hours after your treatment. Please let the nurse know about any problems that you may have experienced. Feel free to call the clinic you have any questions or concerns. The clinic phone number is [phone].   I have been informed and understand all the instructions given to me. I know to contact the clinic, my physician, or go to the Emergency Department if any problems should occur. I do not have any questions at this time, but understand that I may call the clinic during office hours   should I have any questions or need assistance in obtaining follow up care.    __________________________________________  _____________  __________ Signature of Patient or Authorized Representative            Date                   Time    __________________________________________ Nurse's Signature

## 2012-11-10 ENCOUNTER — Ambulatory Visit
Admission: RE | Admit: 2012-11-10 | Discharge: 2012-11-10 | Disposition: A | Discharge status: Home / Self Care | Payer: PRIVATE HEALTH INSURANCE | Source: Ambulatory Visit | Attending: Radiation Oncology | Admitting: Radiation Oncology

## 2012-11-10 ENCOUNTER — Encounter: Payer: Self-pay | Admitting: Radiation Oncology

## 2012-11-10 VITALS — BP 124/76 | HR 90 | Resp 18 | Wt 160.1 lb

## 2012-11-10 DIAGNOSIS — C3490 Malignant neoplasm of unspecified part of unspecified bronchus or lung: Secondary | ICD-10-CM

## 2012-11-10 NOTE — Progress Notes (Signed)
[facility]    Radiation Oncology [address] [place]     Maryln Gottron, M.D. [place], [place] [address]               Billie Lade, M.D., Ph.D. Phone: [phone]      Molli Hazard A. Kathrynn Running, M.D. Fax: [phone]      Radene Gunning, M.D., Ph.D.         Lurline Hare, M.D.         Grayland Jack, M.D Weekly Treatment Management Note  Name: Tracy Dodson     MRN: [id number]        CSN: [id number] Date: 11/10/2012      DOB: 1951/02/24  CC: [name], MD         Larwance Sachs    Status: Outpatient  Diagnosis: The encounter diagnosis was Lung cancer.  Current Dose: 28.8 Gy  Current Fraction: 16  Planned Dose: 50.4 Gy  Narrative: Tracy Dodson was seen today for weekly treatment management. The chart was checked and CBCT  were reviewed. She continues to work her usual schedule. She is having some fatigue however. She is using Carafate which is helped a lot with her esophagitis issues. She does not require any  pain medication at this time.  Review of patient's allergies indicates no known allergies.  Current Outpatient Prescriptions  Medication Sig Dispense Refill  . acetaminophen (TYLENOL ARTHRITIS PAIN) 650 MG CR tablet Take 1,300 mg by mouth 3 (three) times daily.      [name] b complex vitamins tablet Take 1 tablet by mouth daily.      [name] BIOTIN 5000 PO Take 1 tablet by mouth daily.      . Calcium Carbonate-Vitamin D (CALCIUM 600+D) 600-200 MG-UNIT TABS Take 1 tablet by mouth daily.      . citalopram (CELEXA) 20 MG tablet Take 30 mg by mouth daily. Patient uses 1 and one-half tablets.      [name] glucosamine-chondroitin 500-400 MG tablet Take 1 tablet by mouth daily.      [name] LORazepam (ATIVAN) 0.5 MG tablet Take 0.5 mg by mouth 2 (two) times daily.      [name] losartan-hydrochlorothiazide (HYZAAR) 100-25 MG per tablet Take 1 tablet by mouth daily.      . Milk Thistle 1000 MG CAPS Take 1 capsule by mouth daily.      . nicotine polacrilex (COMMIT) 2 MG lozenge Place 2 mg inside  cheek as needed.      . Omega-3 Fatty Acids (FISH OIL) 1000 MG CAPS Take 1 capsule by mouth daily.      . predniSONE (STERAPRED UNI-PAK) 5 MG TABS Take 5 mg by mouth every morning.      . PredniSONE 1 MG TBEC Take 2 tablets by mouth at bedtime.       . prochlorperazine (COMPAZINE) 10 MG tablet Take 1 tablet (10 mg total) by mouth every 6 (six) hours as needed.  30 tablet  0  . simvastatin (ZOCOR) 40 MG tablet Take 40 mg by mouth every evening.      . sucralfate (CARAFATE) 1 GM/10ML suspension Take 10 mLs (1 g total) by mouth 4 (four) times daily.  420 mL  0   No current facility-administered medications for this encounter.   Labs:  Lab Results  Component Value Date   WBC 5.0 11/09/2012   HGB 9.2* 11/09/2012   HCT 28.5* 11/09/2012   MCV 85.3 11/09/2012   PLT 196 11/09/2012   Lab Results  Component Value Date   CREATININE 0.8 11/09/2012   BUN 15.7 11/09/2012   NA 137 11/09/2012   K 3.3* 11/09/2012   CL 101 11/09/2012   CO2 27 11/09/2012   Lab Results  Component Value Date   ALT 21 11/09/2012   AST 21 11/09/2012   BILITOT 0.54 11/09/2012    Physical Examination:  weight is 160 lb 1.6 oz (72.621 kg). Her blood pressure is 124/76 and her pulse is 90. Her respiration is 18 and oxygen saturation is 100%.    Wt Readings from Last 3 Encounters:  11/10/12 160 lb 1.6 oz (72.621 kg)  11/03/12 163 lb 4.8 oz (74.072 kg)  10/27/12 163 lb 4.8 oz (74.072 kg)     Lungs - Normal respiratory effort, chest expands symmetrically. Lungs are clear to auscultation, no crackles or wheezes.  Heart has regular rhythm and rate  Abdomen is soft and non tender with normal bowel sounds  Assessment:  Patient tolerating treatments well  Plan: Continue treatment per original radiation prescription

## 2012-11-10 NOTE — Progress Notes (Addendum)
Patient presents to the clinic today unaccompanied for PUT with Dr. Roselind Messier. Patient alert and oriented to person, place, and time. No distress noted. Steady gait noted. Pleasant affect noted. Patient denies pain at this time. Patient reports that she had two nose bleeds yesterday. Encouraged patient to humidify air and apply Vaseline to nostrils. Patient verbalized understanding. Patient reports that she had chemotherapy yesterday and was unable to sleep well last night due to effects of steroids. Patient denies cough. Patient denies shortness of breath. Patient reports taking Carafate before meals. Patient reports that as long as she take the Carafate she doesn't have difficulty or painful swallowing. Patient reports using radiaplex on chest. Faint hyperpigmentation without desquamation of chest area noted. Reported all findings to Dr. Roselind Messier.

## 2012-11-11 ENCOUNTER — Ambulatory Visit
Admission: RE | Admit: 2012-11-11 | Discharge: 2012-11-11 | Disposition: A | Discharge status: Home / Self Care | Payer: PRIVATE HEALTH INSURANCE | Source: Ambulatory Visit | Attending: Radiation Oncology | Admitting: Radiation Oncology

## 2012-11-12 ENCOUNTER — Ambulatory Visit: Payer: PRIVATE HEALTH INSURANCE

## 2012-11-12 ENCOUNTER — Telehealth: Payer: Self-pay | Admitting: Oncology

## 2012-11-12 NOTE — Telephone Encounter (Deleted)
k

## 2012-11-13 ENCOUNTER — Ambulatory Visit
Admission: RE | Admit: 2012-11-13 | Discharge: 2012-11-13 | Disposition: A | Discharge status: Home / Self Care | Payer: PRIVATE HEALTH INSURANCE | Source: Ambulatory Visit | Attending: Radiation Oncology | Admitting: Radiation Oncology

## 2012-11-16 ENCOUNTER — Other Ambulatory Visit (HOSPITAL_BASED_OUTPATIENT_CLINIC_OR_DEPARTMENT_OTHER): Payer: PRIVATE HEALTH INSURANCE | Admitting: Lab

## 2012-11-16 ENCOUNTER — Ambulatory Visit (HOSPITAL_BASED_OUTPATIENT_CLINIC_OR_DEPARTMENT_OTHER): Payer: PRIVATE HEALTH INSURANCE

## 2012-11-16 ENCOUNTER — Ambulatory Visit (HOSPITAL_BASED_OUTPATIENT_CLINIC_OR_DEPARTMENT_OTHER): Payer: PRIVATE HEALTH INSURANCE | Admitting: Physician Assistant

## 2012-11-16 ENCOUNTER — Other Ambulatory Visit: Payer: Self-pay | Admitting: Medical Oncology

## 2012-11-16 ENCOUNTER — Ambulatory Visit
Admission: RE | Admit: 2012-11-16 | Discharge: 2012-11-16 | Disposition: A | Discharge status: Home / Self Care | Payer: PRIVATE HEALTH INSURANCE | Source: Ambulatory Visit | Attending: Radiation Oncology | Admitting: Radiation Oncology

## 2012-11-16 ENCOUNTER — Encounter: Payer: Self-pay | Admitting: Physician Assistant

## 2012-11-16 VITALS — BP 114/57 | HR 103 | Temp 98.0°F | Resp 20 | Ht 66.0 in | Wt 158.8 lb

## 2012-11-16 DIAGNOSIS — Z5111 Encounter for antineoplastic chemotherapy: Secondary | ICD-10-CM

## 2012-11-16 DIAGNOSIS — C342 Malignant neoplasm of middle lobe, bronchus or lung: Secondary | ICD-10-CM

## 2012-11-16 DIAGNOSIS — C3490 Malignant neoplasm of unspecified part of unspecified bronchus or lung: Secondary | ICD-10-CM

## 2012-11-16 DIAGNOSIS — E876 Hypokalemia: Secondary | ICD-10-CM

## 2012-11-16 LAB — CBC WITH DIFFERENTIAL/PLATELET
Eosinophils Absolute: 0 10*3/uL (ref 0.0–0.5)
HCT: 26.2 % — ABNORMAL LOW (ref 34.8–46.6)
LYMPH%: 10.9 % — ABNORMAL LOW (ref 14.0–49.7)
MCV: 87.6 fL (ref 79.5–101.0)
MONO#: 1.2 10*3/uL — ABNORMAL HIGH (ref 0.1–0.9)
MONO%: 27.4 % — ABNORMAL HIGH (ref 0.0–14.0)
NEUT#: 2.7 10*3/uL (ref 1.5–6.5)
NEUT%: 61.3 % (ref 38.4–76.8)
Platelets: 93 10*3/uL — ABNORMAL LOW (ref 145–400)
WBC: 4.4 10*3/uL (ref 3.9–10.3)

## 2012-11-16 LAB — COMPREHENSIVE METABOLIC PANEL (CC13)
Alkaline Phosphatase: 61 U/L (ref 40–150)
CO2: 30 mEq/L — ABNORMAL HIGH (ref 22–29)
Creatinine: 0.9 mg/dL (ref 0.6–1.1)
Glucose: 111 mg/dl — ABNORMAL HIGH (ref 70–99)
Sodium: 139 mEq/L (ref 136–145)
Total Bilirubin: 0.33 mg/dL (ref 0.20–1.20)
Total Protein: 7 g/dL (ref 6.4–8.3)

## 2012-11-16 MED ORDER — PROCHLORPERAZINE MALEATE 10 MG PO TABS: 10.0000 mg | ORAL_TABLET | Freq: Four times a day (QID) | ORAL | Status: DC | PRN

## 2012-11-16 MED ORDER — PACLITAXEL CHEMO INJECTION 300 MG/50ML
45.0000 mg/m2 | Freq: Once | INTRAVENOUS | Status: AC
  Administered 2012-11-16: 84 mg via INTRAVENOUS
  Filled 2012-11-16: qty 14

## 2012-11-16 MED ORDER — FAMOTIDINE IN NACL 20-0.9 MG/50ML-% IV SOLN
20.0000 mg | Freq: Once | INTRAVENOUS | Status: AC
  Administered 2012-11-16: 20 mg via INTRAVENOUS

## 2012-11-16 MED ORDER — POTASSIUM CHLORIDE CRYS ER 20 MEQ PO TBCR: 40.0000 meq | EXTENDED_RELEASE_TABLET | Freq: Every day | ORAL | Status: DC

## 2012-11-16 MED ORDER — DIPHENHYDRAMINE HCL 50 MG/ML IJ SOLN
25.0000 mg | Freq: Once | INTRAMUSCULAR | Status: AC
  Administered 2012-11-16: 25 mg via INTRAVENOUS

## 2012-11-16 MED ORDER — SODIUM CHLORIDE 0.9 % IV SOLN
220.0000 mg | Freq: Once | INTRAVENOUS | Status: AC
  Administered 2012-11-16: 220 mg via INTRAVENOUS
  Filled 2012-11-16: qty 22

## 2012-11-16 MED ORDER — DEXAMETHASONE SODIUM PHOSPHATE 4 MG/ML IJ SOLN
20.0000 mg | Freq: Once | INTRAMUSCULAR | Status: AC
  Administered 2012-11-16: 20 mg via INTRAVENOUS

## 2012-11-16 MED ORDER — ONDANSETRON 16 MG/50ML IVPB (CHCC)
16.0000 mg | Freq: Once | INTRAVENOUS | Status: AC
  Administered 2012-11-16: 16 mg via INTRAVENOUS

## 2012-11-16 MED ORDER — SODIUM CHLORIDE 0.9 % IV SOLN
Freq: Once | INTRAVENOUS | Status: AC
  Administered 2012-11-16: 16:00:00 via INTRAVENOUS

## 2012-11-16 NOTE — Patient Instructions (Addendum)
Complete your course of concurrent chemoradiation as scheduled Followup with Dr. Arbutus Ped approximately one month after completion of your course of concurrent chemoradiation with a restaging CT scan of your chest to reevaluate your disease

## 2012-11-16 NOTE — Progress Notes (Signed)
Left message with the patient's husband Reita Cliche regarding her low potassium at a prescription for potassium replacement was also at her pharmacy for her to pick up. Husband stated he would relay the message to the patient. She is free to call our office with any questions.   Laural Benes, Nayra Coury E, PA-C

## 2012-11-16 NOTE — Telephone Encounter (Signed)
Called in kdur and pt notified. 

## 2012-11-16 NOTE — Patient Instructions (Addendum)
[facility] Discharge Instructions for Patients Receiving Chemotherapy  Today you received the following chemotherapy agents taxol/ carboplatin  To help prevent nausea and vomiting after your treatment, we encourage you to take your nausea medication  and take it as often as prescribed   If you develop nausea and vomiting that is not controlled by your nausea medication, call the clinic. If it is after clinic hours your family physician or the after hours number for the clinic or go to the Emergency Department.   BELOW ARE SYMPTOMS THAT SHOULD BE REPORTED IMMEDIATELY:  *FEVER GREATER THAN 100.5 F  *CHILLS WITH OR WITHOUT FEVER  NAUSEA AND VOMITING THAT IS NOT CONTROLLED WITH YOUR NAUSEA MEDICATION  *UNUSUAL SHORTNESS OF BREATH  *UNUSUAL BRUISING OR BLEEDING  TENDERNESS IN MOUTH AND THROAT WITH OR WITHOUT PRESENCE OF ULCERS  *URINARY PROBLEMS  *BOWEL PROBLEMS  UNUSUAL RASH Items with * indicate a potential emergency and should be followed up as soon as possible.  One of the nurses will contact you 24 hours after your treatment. Please let the nurse know about any problems that you may have experienced. Feel free to call the clinic you have any questions or concerns. The clinic phone number is [phone].   I have been informed and understand all the instructions given to me. I know to contact the clinic, my physician, or go to the Emergency Department if any problems should occur. I do not have any questions at this time, but understand that I may call the clinic during office hours   should I have any questions or need assistance in obtaining follow up care.    __________________________________________  _____________  __________ Signature of Patient or Authorized Representative            Date                   Time    __________________________________________ Nurse's Signature    

## 2012-11-17 ENCOUNTER — Ambulatory Visit
Admission: RE | Admit: 2012-11-17 | Discharge: 2012-11-17 | Disposition: A | Discharge status: Home / Self Care | Payer: PRIVATE HEALTH INSURANCE | Source: Ambulatory Visit | Attending: Radiation Oncology | Admitting: Radiation Oncology

## 2012-11-17 ENCOUNTER — Encounter: Payer: Self-pay | Admitting: Radiation Oncology

## 2012-11-17 VITALS — BP 128/73 | HR 91 | Resp 18 | Wt 160.1 lb

## 2012-11-17 DIAGNOSIS — C3490 Malignant neoplasm of unspecified part of unspecified bronchus or lung: Secondary | ICD-10-CM

## 2012-11-17 NOTE — Progress Notes (Signed)
[facility]    Radiation Oncology [address] [place]     Maryln Gottron, M.D. [place], [place] [address]               Billie Lade, M.D., Ph.D. Phone: [phone]      Molli Hazard A. Kathrynn Running, M.D. Fax: [phone]      Radene Gunning, M.D., Ph.D.         Lurline Hare, M.D.         Grayland Jack, M.D Weekly Treatment Management Note  Name: Tracy Dodson     MRN: [id number]        CSN: [id number] Date: 11/17/2012      DOB: 1951-08-20  CC: Tracy Morale, MD         Larwance Sachs    Status: Outpatient  Diagnosis: The encounter diagnosis was Lung cancer.  Current Dose: 36 Gy  Current Fraction: 20  Planned Dose: 50.4 Gy  Narrative: Tracy Dodson was seen today for weekly treatment management. The chart was checked and CBCT  were reviewed. She is having some fatigue as well as some mild shortness of breath. She was found to be anemic this week. She may require transfusion if her hemoglobin is lower next  next week.  Carafate continues to help very well for her esophageal symptoms. She was also started on a potassium supplement.  Review of patient's allergies indicates no known allergies.  Current Outpatient Prescriptions  Medication Sig Dispense Refill  . acetaminophen (TYLENOL ARTHRITIS PAIN) 650 MG CR tablet Take 1,300 mg by mouth 3 (three) times daily.      [name] b complex vitamins tablet Take 1 tablet by mouth daily.      [name] BIOTIN 5000 PO Take 1 tablet by mouth daily.      . Calcium Carbonate-Vitamin D (CALCIUM 600+D) 600-200 MG-UNIT TABS Take 1 tablet by mouth daily.      . citalopram (CELEXA) 20 MG tablet Take 30 mg by mouth daily. Patient uses 1 and one-half tablets.      [name] glucosamine-chondroitin 500-400 MG tablet Take 1 tablet by mouth daily.      [name] LORazepam (ATIVAN) 0.5 MG tablet Take 0.5 mg by mouth 2 (two) times daily.      [name] losartan-hydrochlorothiazide (HYZAAR) 100-25 MG per tablet Take 1 tablet by mouth daily.      . Milk Thistle 1000 MG CAPS Take 1 capsule  by mouth daily.      . nicotine polacrilex (COMMIT) 2 MG lozenge Place 2 mg inside cheek as needed.      . Omega-3 Fatty Acids (FISH OIL) 1000 MG CAPS Take 1 capsule by mouth daily.      . potassium chloride SA (K-DUR,KLOR-CON) 20 MEQ tablet Take 2 tablets (40 mEq total) by mouth daily.  10 tablet  0  . predniSONE (STERAPRED UNI-PAK) 5 MG TABS Take 5 mg by mouth every morning.      . PredniSONE 1 MG TBEC Take 1 tablet by mouth every morning.       . prochlorperazine (COMPAZINE) 10 MG tablet Take 1 tablet (10 mg total) by mouth every 6 (six) hours as needed.  30 tablet  0  . simvastatin (ZOCOR) 40 MG tablet Take 40 mg by mouth every evening.      . sucralfate (CARAFATE) 1 GM/10ML suspension Take 10 mLs (1 g total) by mouth 4 (four) times daily.  420 mL  0   No current facility-administered medications for this encounter.  Labs:  Lab Results  Component Value Date   WBC 4.4 11/16/2012   HGB 8.2* 11/16/2012   HCT 26.2* 11/16/2012   MCV 87.6 11/16/2012   PLT 93* 11/16/2012   Lab Results  Component Value Date   CREATININE 0.9 11/16/2012   BUN 17.9 11/16/2012   NA 139 11/16/2012   K 2.9 Repeated and Verified* 11/16/2012   CL 99 11/16/2012   CO2 30* 11/16/2012   Lab Results  Component Value Date   ALT 30 11/16/2012   AST 24 11/16/2012   BILITOT 0.33 11/16/2012    Physical Examination:  weight is 160 lb 1.6 oz (72.621 kg). Her blood pressure is 128/73 and her pulse is 91. Her respiration is 18 and oxygen saturation is 100%.    Wt Readings from Last 3 Encounters:  11/17/12 160 lb 1.6 oz (72.621 kg)  11/16/12 158 lb 12.8 oz (72.031 kg)  11/10/12 160 lb 1.6 oz (72.621 kg)     Lungs - Normal respiratory effort, chest expands symmetrically. Lungs are clear to auscultation, no crackles or wheezes.  Heart has regular rhythm and rate  Abdomen is soft and non tender with normal bowel sounds  Assessment:  Patient tolerating treatments well  Plan: Continue treatment per original radiation  prescription

## 2012-11-17 NOTE — Progress Notes (Signed)
Patient presents to the clinic today unaccompanied for PUT with Dr. Roselind Messier. Patient is alert and oriented to person, place, and time. No distress noted. Steady gait noted. Pleasant affect noted. Patient denies pain at this time. Patient reports nasal congestion and nose bleed. Discuss use of saline and humidifier. Patient reports fatigue. Patient reports that she has taken time off work. Patient denies painful or difficulty swallowing. Patient reports using carafate. Patient reports she is now taking potassium supplement. Patient reports nausea but, that she been prescribed antiemetic. Patient reports dry cough yesterday that has resolved. Patient reports shortness of breath. Reported all findings to Dr. Roselind Messier.

## 2012-11-18 ENCOUNTER — Ambulatory Visit
Admission: RE | Admit: 2012-11-18 | Discharge: 2012-11-18 | Disposition: A | Discharge status: Home / Self Care | Payer: PRIVATE HEALTH INSURANCE | Source: Ambulatory Visit | Attending: Radiation Oncology | Admitting: Radiation Oncology
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
## 2012-11-19 ENCOUNTER — Ambulatory Visit
Admission: RE | Admit: 2012-11-19 | Discharge: 2012-11-19 | Disposition: A | Discharge status: Home / Self Care | Payer: PRIVATE HEALTH INSURANCE | Source: Ambulatory Visit | Attending: Radiation Oncology | Admitting: Radiation Oncology

## 2012-11-20 ENCOUNTER — Telehealth: Payer: Self-pay | Admitting: Internal Medicine

## 2012-11-20 ENCOUNTER — Ambulatory Visit
Admission: RE | Admit: 2012-11-20 | Discharge: 2012-11-20 | Disposition: A | Discharge status: Home / Self Care | Payer: PRIVATE HEALTH INSURANCE | Source: Ambulatory Visit | Attending: Radiation Oncology | Admitting: Radiation Oncology

## 2012-11-20 ENCOUNTER — Telehealth: Payer: Self-pay

## 2012-11-20 NOTE — Telephone Encounter (Signed)
x

## 2012-11-20 NOTE — Telephone Encounter (Signed)
Voice mail left to inform patient refill for carafate called into [facility] [address].should be ready after 3 pm for pick-up.

## 2012-11-20 NOTE — Telephone Encounter (Signed)
LMONVM ADVISING THE PT OF HER LAB AND MD APPTS FOR Tracy Dodson

## 2012-11-23 ENCOUNTER — Other Ambulatory Visit (HOSPITAL_BASED_OUTPATIENT_CLINIC_OR_DEPARTMENT_OTHER): Payer: PRIVATE HEALTH INSURANCE

## 2012-11-23 ENCOUNTER — Ambulatory Visit
Admission: RE | Admit: 2012-11-23 | Discharge: 2012-11-23 | Disposition: A | Discharge status: Home / Self Care | Payer: PRIVATE HEALTH INSURANCE | Source: Ambulatory Visit | Attending: Radiation Oncology | Admitting: Radiation Oncology

## 2012-11-23 ENCOUNTER — Ambulatory Visit: Payer: PRIVATE HEALTH INSURANCE

## 2012-11-23 ENCOUNTER — Other Ambulatory Visit: Payer: Self-pay | Admitting: *Deleted

## 2012-11-23 DIAGNOSIS — C342 Malignant neoplasm of middle lobe, bronchus or lung: Secondary | ICD-10-CM

## 2012-11-23 DIAGNOSIS — C3490 Malignant neoplasm of unspecified part of unspecified bronchus or lung: Secondary | ICD-10-CM

## 2012-11-23 LAB — CBC WITH DIFFERENTIAL/PLATELET
Basophils Absolute: 0 10*3/uL (ref 0.0–0.1)
HCT: 25.4 % — ABNORMAL LOW (ref 34.8–46.6)
HGB: 7.9 g/dL — ABNORMAL LOW (ref 11.6–15.9)
MCH: 27.4 pg (ref 25.1–34.0)
MONO#: 1.2 10*3/uL — ABNORMAL HIGH (ref 0.1–0.9)
NEUT%: 60.8 % (ref 38.4–76.8)
WBC: 4.2 10*3/uL (ref 3.9–10.3)
lymph#: 0.5 10*3/uL — ABNORMAL LOW (ref 0.9–3.3)

## 2012-11-23 NOTE — Progress Notes (Signed)
Hold chemotherapy today due to platelets 70 and hgb 7.9 per Dr. Arbutus Ped.

## 2012-11-23 NOTE — Patient Instructions (Addendum)
Anemia, Nonspecific Your exam and blood tests show you are anemic. This means your blood (hemoglobin) level is low. Normal hemoglobin values are 12 to 15 g/dL for females and 14 to 17 g/dL for males. Make a note of your hemoglobin level today. The hematocrit percent is also used to measure anemia. A normal hematocrit is 38% to 46% in females and 42% to 49% in males. Make a note of your hematocrit level today. CAUSES  Anemia can be due to many different causes.  Excessive bleeding from periods (in women).  Intestinal bleeding.  Poor nutrition.  Kidney, thyroid, liver, and bone marrow diseases. SYMPTOMS  Anemia can come on suddenly (acute). It can also come on slowly. Symptoms can include:  Minor weakness.  Dizziness.  Palpitations.  Shortness of breath. Symptoms may be absent until half your hemoglobin is missing if it comes on slowly. Anemia due to acute blood loss from an injury or internal bleeding may require blood transfusion if the loss is severe. Hospital care is needed if you are anemic and there is significant continual blood loss. TREATMENT   Stool tests for blood (Hemoccult) and additional lab tests are often needed. This determines the best treatment.  Further checking on your condition and your response to treatment is very important. It often takes many weeks to correct anemia. Depending on the cause, treatment can include:  Supplements of iron.  Vitamins B12 and folic acid.  Hormone medicines.If your anemia is due to bleeding, finding the cause of the blood loss is very important. This will help avoid further problems. SEEK IMMEDIATE MEDICAL CARE IF:   You develop fainting, extreme weakness, shortness of breath, or chest pain.  You develop heavy vaginal bleeding.  You develop bloody or black, tarry stools or vomit up blood.  You develop a high fever, rash, repeated vomiting, or dehydration. Document Released: 10/24/2004 Document Revised: 12/09/2011 Document  Reviewed: 08/01/2009 [facility] Patient Information 2013 [place], [place]. Thrombocytopenia Thrombocytopenia means there are not enough platelets in your blood. Platelets are tiny cells in your blood. When you start bleeding, platelets clump together around the cut or injury to stop the bleeding. This process is called blood clotting. Not having enough platelets can cause bleeding problems. HOME CARE  Check your skin and inside your mouth for bruises or blood as told by your doctor.  Check your spit (sputum), pee (urine), and poop (stool) for blood as told by your doctor.  Do not do activities that can cause bumps or bruises until your doctor says it is okay.  Be careful not to cut yourself when you shave or use scissors, needles, knives, or other tools.  Be careful not to burn yourself when you iron or cook.  Ask your doctor if you can drink alcohol.  Only take medicines as told by your doctor.  Tell all your doctors and your dentist that you have this bleeding problem. GET HELP RIGHT AWAY IF:  You are bleeding anywhere on your body.  You are bleeding or have bruises without knowing why.  You have blood in your spit, pee, or poop. MAKE SURE YOU:  Understand these instructions.  Will watch your condition.  Will get help right away if you are not doing well or get worse. Document Released: 09/05/2011 Document Revised: 12/09/2011 Document Reviewed: 09/05/2011 [facility] Patient Information 2013 [employer], [place].

## 2012-11-23 NOTE — Progress Notes (Signed)
Per Dr Donnald Garre, pt does not need blood today with hbg 7.9, and tx needs to be delayed x 1 week.  Onc tx schedule filled out.  SLJ

## 2012-11-24 ENCOUNTER — Ambulatory Visit
Admission: RE | Admit: 2012-11-24 | Discharge: 2012-11-24 | Disposition: A | Discharge status: Home / Self Care | Payer: PRIVATE HEALTH INSURANCE | Source: Ambulatory Visit | Attending: Radiation Oncology | Admitting: Radiation Oncology

## 2012-11-24 ENCOUNTER — Telehealth: Payer: Self-pay | Admitting: *Deleted

## 2012-11-24 ENCOUNTER — Encounter: Payer: Self-pay | Admitting: Radiation Oncology

## 2012-11-24 NOTE — Progress Notes (Signed)
Patient presents to the clinic today unaccompanied for PUT with Dr. Roselind Messier. Patient is alert and oriented to person, place, and time. No distress noted. Steady gait noted. Pleasant affect noted. Patient denies pain at this time. Patient reports great fatigue. Patient has no returned to work due to fatigue. Patient reports that chemo was held yesterday because her hemoglobin was 7.9. Patient reports biotene rinse helps to resolve dry mouth. Patient reports a dry cough. Patient denies painful or difficulty swallowing so long as she takes her carafate and chews her food well. Patient has lost only one pound since last week. Patient reports increased shortness of breath with exertion. Hyperpigmentation of treatment area noted without desquamation. Patient reports using radiaplex bid as directed. Reported all findings to Dr. Roselind Messier.

## 2012-11-24 NOTE — Telephone Encounter (Signed)
Per staff message and POF I have scheduled appts.  JMW  

## 2012-11-24 NOTE — Telephone Encounter (Signed)
Pt called left message.  I called back.  She had questions regarding her anemia.  I spoke with Dr. Arbutus Ped regarding questions.  I then spoke to the pt regarding questions.  All questions and concerns addressed

## 2012-11-24 NOTE — Progress Notes (Signed)
[facility]    Radiation Oncology [address] [place]     Maryln Gottron, M.D. [place], [place] [address]               Billie Lade, M.D., Ph.D. Phone: [phone]      Molli Hazard A. Kathrynn Running, M.D. Fax: [phone]      Radene Gunning, M.D., Ph.D.         Lurline Hare, M.D.         Grayland Jack, M.D Weekly Treatment Management Note  Name: Tracy Dodson     MRN: [id number]        CSN: [id number] Date: 11/24/2012      DOB: Jul 08, 1951  CC: [name], MD         Larwance Sachs    Status: Outpatient  Diagnosis: The encounter diagnosis was Lung cancer, right.  Current Dose: 45 Gy  Current Fraction: 25  Planned Dose: 50.4 Gy  Narrative: Tracy Dodson was seen today for weekly treatment management. The chart was checked and CBCT  were reviewed. She is having a fair amount of fatigue at this time. She however has been anemic. She has some swallowing difficulties but Carafate helps tremendously for this issue.  She has a mild dry cough. She has stopped work in light of her fatigue.  Review of patient's allergies indicates no known allergies.  Current Outpatient Prescriptions  Medication Sig Dispense Refill  . acetaminophen (TYLENOL ARTHRITIS PAIN) 650 MG CR tablet Take 1,300 mg by mouth 3 (three) times daily.      [name] b complex vitamins tablet Take 1 tablet by mouth daily.      [name] BIOTIN 5000 PO Take 1 tablet by mouth daily.      . Calcium Carbonate-Vitamin D (CALCIUM 600+D) 600-200 MG-UNIT TABS Take 1 tablet by mouth daily.      . citalopram (CELEXA) 20 MG tablet Take 30 mg by mouth daily. Patient uses 1 and one-half tablets.      [name] glucosamine-chondroitin 500-400 MG tablet Take 1 tablet by mouth daily.      [name] LORazepam (ATIVAN) 0.5 MG tablet Take 0.5 mg by mouth 2 (two) times daily.      [name] losartan-hydrochlorothiazide (HYZAAR) 100-25 MG per tablet Take 1 tablet by mouth daily.      . Milk Thistle 1000 MG CAPS Take 1 capsule by mouth daily.      . nicotine polacrilex  (COMMIT) 2 MG lozenge Place 2 mg inside cheek as needed.      . Omega-3 Fatty Acids (FISH OIL) 1000 MG CAPS Take 1 capsule by mouth daily.      . potassium chloride SA (K-DUR,KLOR-CON) 20 MEQ tablet Take 2 tablets (40 mEq total) by mouth daily.  10 tablet  0  . predniSONE (STERAPRED UNI-PAK) 5 MG TABS Take 5 mg by mouth every morning.      . PredniSONE 1 MG TBEC Take 1 tablet by mouth every morning.       . prochlorperazine (COMPAZINE) 10 MG tablet Take 1 tablet (10 mg total) by mouth every 6 (six) hours as needed.  30 tablet  0  . simvastatin (ZOCOR) 40 MG tablet Take 40 mg by mouth every evening.      . sucralfate (CARAFATE) 1 GM/10ML suspension Take 1 g by mouth 4 (four) times daily. Refill called into [facility] [address] 420 ml with 2 refills       No current facility-administered medications for this encounter.  Labs:  Lab Results  Component Value Date   WBC 4.2 11/23/2012   HGB 7.9* 11/23/2012   HCT 25.4* 11/23/2012   MCV 88.2 11/23/2012   PLT 70* 11/23/2012   Lab Results  Component Value Date   CREATININE 0.9 11/16/2012   BUN 17.9 11/16/2012   NA 139 11/16/2012   K 2.9 Repeated and Verified* 11/16/2012   CL 99 11/16/2012   CO2 30* 11/16/2012   Lab Results  Component Value Date   ALT 30 11/16/2012   AST 24 11/16/2012   BILITOT 0.33 11/16/2012    Physical Examination:  weight is 159 lb (72.122 kg). Her blood pressure is 98/56 and her pulse is 101. Her respiration is 18 and oxygen saturation is 98%.    Wt Readings from Last 3 Encounters:  11/24/12 159 lb (72.122 kg)  11/17/12 160 lb 1.6 oz (72.621 kg)  11/16/12 158 lb 12.8 oz (72.031 kg)     Lungs - Normal respiratory effort, chest expands symmetrically. Lungs are clear to auscultation, no crackles or wheezes.  Heart has regular rhythm and rate  Abdomen is soft and non tender with normal bowel sounds  Assessment:  Patient tolerating treatments well except for issues as above  Plan: Continue treatment per original  radiation prescription

## 2012-11-25 ENCOUNTER — Ambulatory Visit
Admission: RE | Admit: 2012-11-25 | Discharge: 2012-11-25 | Disposition: A | Discharge status: Home / Self Care | Payer: PRIVATE HEALTH INSURANCE | Source: Ambulatory Visit | Attending: Radiation Oncology | Admitting: Radiation Oncology

## 2012-11-25 ENCOUNTER — Telehealth: Payer: Self-pay | Admitting: Dietician

## 2012-11-26 ENCOUNTER — Ambulatory Visit: Payer: PRIVATE HEALTH INSURANCE

## 2012-11-26 ENCOUNTER — Ambulatory Visit
Admission: RE | Admit: 2012-11-26 | Discharge: 2012-11-26 | Disposition: A | Discharge status: Home / Self Care | Payer: PRIVATE HEALTH INSURANCE | Source: Ambulatory Visit | Attending: Radiation Oncology | Admitting: Radiation Oncology

## 2012-11-27 ENCOUNTER — Ambulatory Visit: Payer: PRIVATE HEALTH INSURANCE

## 2012-11-27 ENCOUNTER — Ambulatory Visit
Admission: RE | Admit: 2012-11-27 | Discharge: 2012-11-27 | Disposition: A | Discharge status: Home / Self Care | Payer: PRIVATE HEALTH INSURANCE | Source: Ambulatory Visit | Attending: Radiation Oncology | Admitting: Radiation Oncology

## 2012-11-27 ENCOUNTER — Encounter: Payer: Self-pay | Admitting: Radiation Oncology

## 2012-11-27 NOTE — Progress Notes (Signed)
Patient presents to the clinic today unaccompanied for PUT following final treatment. Patient is alert and oriented to person, place, and time. No distress noted. Steady gait noted. Pleasant affect noted. Patient denies pain at this time. Patient reports fatigue. Patient plans to return to work full time once her energy level increases. Patient reports hyperpigmented skin of the chest and upper back without desquamation. Patient reports using radiaplex bid as directed. Patient reports dry cough. Patient reports shortness of breath is no worse. Patient reports diminished taste. Patient denies painful or difficulty swallowing as long as she takes her carafate. Patient reports biotene mouth rinse helps with her dry mouth. Reported all findings to Dr. Mitzi Hansen. Encouraged patient to contact staff with needs. Patient verbalized understanding. Provided patient with appt card to return for one month follow up. Ensured patient has enough radiaplex gel to apply bid for next two weeks.

## 2012-11-27 NOTE — Progress Notes (Signed)
Department of Radiation Oncology  Phone:  [phone] Fax:        [phone]  Weekly Treatment Note    Name: Tracy Dodson Date: 11/27/2012 MRN: [id number] DOB: 05-07-1951   Current dose: 50.4 Gy  Current fraction: 28   MEDICATIONS: Current Outpatient Prescriptions  Medication Sig Dispense Refill  . acetaminophen (TYLENOL ARTHRITIS PAIN) 650 MG CR tablet Take 1,300 mg by mouth 3 (three) times daily.      [name] b complex vitamins tablet Take 1 tablet by mouth daily.      [name] BIOTIN 5000 PO Take 1 tablet by mouth daily.      . Calcium Carbonate-Vitamin D (CALCIUM 600+D) 600-200 MG-UNIT TABS Take 1 tablet by mouth daily.      . citalopram (CELEXA) 20 MG tablet Take 30 mg by mouth daily. Patient uses 1 and one-half tablets.      [name] glucosamine-chondroitin 500-400 MG tablet Take 1 tablet by mouth daily.      [name] LORazepam (ATIVAN) 0.5 MG tablet Take 0.5 mg by mouth 2 (two) times daily.      [name] losartan-hydrochlorothiazide (HYZAAR) 100-25 MG per tablet Take 1 tablet by mouth daily.      . Milk Thistle 1000 MG CAPS Take 1 capsule by mouth daily.      . nicotine polacrilex (COMMIT) 2 MG lozenge Place 2 mg inside cheek as needed.      . Omega-3 Fatty Acids (FISH OIL) 1000 MG CAPS Take 1 capsule by mouth daily.      . potassium chloride SA (K-DUR,KLOR-CON) 20 MEQ tablet Take 2 tablets (40 mEq total) by mouth daily.  10 tablet  0  . predniSONE (STERAPRED UNI-PAK) 5 MG TABS Take 5 mg by mouth every morning.      . PredniSONE 1 MG TBEC Take 1 tablet by mouth every morning.       . prochlorperazine (COMPAZINE) 10 MG tablet Take 1 tablet (10 mg total) by mouth every 6 (six) hours as needed.  30 tablet  0  . simvastatin (ZOCOR) 40 MG tablet Take 40 mg by mouth every evening.      . sucralfate (CARAFATE) 1 GM/10ML suspension Take 1 g by mouth 4 (four) times daily. Refill called into [facility] [address] 420 ml with 2 refills      . Wound Cleansers (RADIAPLEX EX) Apply topically.       No  current facility-administered medications for this encounter.     ALLERGIES: Review of patient's allergies indicates no known allergies.   LABORATORY DATA:  Lab Results  Component Value Date   WBC 4.2 11/23/2012   HGB 7.9* 11/23/2012   HCT 25.4* 11/23/2012   MCV 88.2 11/23/2012   PLT 70* 11/23/2012   Lab Results  Component Value Date   NA 139 11/16/2012   K 2.9 Repeated and Verified* 11/16/2012   CL 99 11/16/2012   CO2 30* 11/16/2012   Lab Results  Component Value Date   ALT 30 11/16/2012   AST 24 11/16/2012   ALKPHOS 61 11/16/2012   BILITOT 0.33 11/16/2012     NARRATIVE: Tracy Dodson was seen today for weekly treatment management. The chart was checked and the patient's films were reviewed. The patient states that she has done well with the end of her treatment. She has been using Carafate and this has worked well for esophagitis. She does have an occasional cough. No worsening shortness of breath  PHYSICAL EXAMINATION: weight is 160 lb (72.576 kg). Her  oral temperature is 98 F (36.7 C). Her blood pressure is 120/69 and her pulse is 101. Her respiration is 16 and oxygen saturation is 98%.        ASSESSMENT: The patient did satisfactorily with treatment.  PLAN: Followup in one month.

## 2012-11-30 ENCOUNTER — Other Ambulatory Visit: Payer: Self-pay | Admitting: Internal Medicine

## 2012-11-30 ENCOUNTER — Ambulatory Visit (HOSPITAL_BASED_OUTPATIENT_CLINIC_OR_DEPARTMENT_OTHER): Payer: PRIVATE HEALTH INSURANCE

## 2012-11-30 ENCOUNTER — Ambulatory Visit: Payer: PRIVATE HEALTH INSURANCE

## 2012-11-30 ENCOUNTER — Other Ambulatory Visit (HOSPITAL_BASED_OUTPATIENT_CLINIC_OR_DEPARTMENT_OTHER): Payer: PRIVATE HEALTH INSURANCE

## 2012-11-30 DIAGNOSIS — C342 Malignant neoplasm of middle lobe, bronchus or lung: Secondary | ICD-10-CM

## 2012-11-30 DIAGNOSIS — C3490 Malignant neoplasm of unspecified part of unspecified bronchus or lung: Secondary | ICD-10-CM

## 2012-11-30 DIAGNOSIS — Z5111 Encounter for antineoplastic chemotherapy: Secondary | ICD-10-CM

## 2012-11-30 LAB — CBC WITH DIFFERENTIAL/PLATELET
BASO%: 0.5 % (ref 0.0–2.0)
EOS%: 0 % (ref 0.0–7.0)
HCT: 25.2 % — ABNORMAL LOW (ref 34.8–46.6)
LYMPH%: 16.4 % (ref 14.0–49.7)
MCH: 28.6 pg (ref 25.1–34.0)
MCHC: 31.3 g/dL — ABNORMAL LOW (ref 31.5–36.0)
MCV: 91.3 fL (ref 79.5–101.0)
NEUT%: 48.3 % (ref 38.4–76.8)
Platelets: 95 10*3/uL (ref <2.0)

## 2012-11-30 LAB — COMPREHENSIVE METABOLIC PANEL (CC13)
AST: 29 U/L (ref 5–34)
Alkaline Phosphatase: 65 U/L (ref 40–150)
BUN: 14 mg/dL (ref 7.0–26.0)
Calcium: 9.2 mg/dL (ref 8.4–10.4)
Chloride: 103 mEq/L (ref 98–107)
Creatinine: 0.7 mg/dL (ref 0.6–1.1)

## 2012-11-30 MED ORDER — SODIUM CHLORIDE 0.9 % IV SOLN
Freq: Once | INTRAVENOUS | Status: AC
  Administered 2012-11-30: 13:00:00 via INTRAVENOUS

## 2012-11-30 MED ORDER — SODIUM CHLORIDE 0.9 % IV SOLN
204.2000 mg | Freq: Once | INTRAVENOUS | Status: AC
  Administered 2012-11-30: 200 mg via INTRAVENOUS
  Filled 2012-11-30: qty 20

## 2012-11-30 MED ORDER — FAMOTIDINE IN NACL 20-0.9 MG/50ML-% IV SOLN
20.0000 mg | Freq: Once | INTRAVENOUS | Status: AC
  Administered 2012-11-30: 20 mg via INTRAVENOUS

## 2012-11-30 MED ORDER — DEXAMETHASONE SODIUM PHOSPHATE 4 MG/ML IJ SOLN
20.0000 mg | Freq: Once | INTRAMUSCULAR | Status: AC
  Administered 2012-11-30: 20 mg via INTRAVENOUS

## 2012-11-30 MED ORDER — PACLITAXEL CHEMO INJECTION 300 MG/50ML
45.0000 mg/m2 | Freq: Once | INTRAVENOUS | Status: AC
  Administered 2012-11-30: 84 mg via INTRAVENOUS
  Filled 2012-11-30: qty 14

## 2012-11-30 MED ORDER — ONDANSETRON 16 MG/50ML IVPB (CHCC)
16.0000 mg | Freq: Once | INTRAVENOUS | Status: AC
  Administered 2012-11-30: 16 mg via INTRAVENOUS

## 2012-11-30 MED ORDER — DIPHENHYDRAMINE HCL 50 MG/ML IJ SOLN
25.0000 mg | Freq: Once | INTRAMUSCULAR | Status: AC
  Administered 2012-11-30: 25 mg via INTRAVENOUS

## 2012-11-30 NOTE — Patient Instructions (Addendum)
[facility] Discharge Instructions for Patients Receiving Chemotherapy  Today you received the following chemotherapy agents: Taxol and Carboplatin. To help prevent nausea and vomiting after your treatment, we encourage you to take your nausea medication, Compazine (Prochlorperazine) Take it as often as prescribed for the next 72 hours as needed.   If you develop nausea and vomiting that is not controlled by your nausea medication, call the clinic. If it is after clinic hours your family physician or the after hours number for the clinic or go to the Emergency Department.   BELOW ARE SYMPTOMS THAT SHOULD BE REPORTED IMMEDIATELY:  *FEVER GREATER THAN 100.5 F  *CHILLS WITH OR WITHOUT FEVER  NAUSEA AND VOMITING THAT IS NOT CONTROLLED WITH YOUR NAUSEA MEDICATION  *UNUSUAL SHORTNESS OF BREATH  *UNUSUAL BRUISING OR BLEEDING  TENDERNESS IN MOUTH AND THROAT WITH OR WITHOUT PRESENCE OF ULCERS  *URINARY PROBLEMS  *BOWEL PROBLEMS  UNUSUAL RASH Items with * indicate a potential emergency and should be followed up as soon as possible.   Feel free to call the clinic you have any questions or concerns. The clinic phone number is [phone].   I have been informed and understand all the instructions given to me. I know to contact the clinic, my physician, or go to the Emergency Department if any problems should occur. I do not have any questions at this time, but understand that I may call the clinic during office hours   should I have any questions or need assistance in obtaining follow up care.

## 2012-12-01 ENCOUNTER — Ambulatory Visit: Payer: PRIVATE HEALTH INSURANCE

## 2012-12-03 ENCOUNTER — Encounter: Payer: Self-pay | Admitting: Radiation Oncology

## 2012-12-03 NOTE — Progress Notes (Signed)
  Radiation Oncology         [phone] ________________________________  Name: Tracy Dodson MRN: [id number]  Date: 12/03/2012  DOB: April 23, 1951  End of Treatment Note  Diagnosis:  stage IIIA (T1 B., N2, M0) non-small cell lung cancer, suspicious for adenocarcinoma.    Indication for treatment:  Preop along with radiosensitizing chemotherapy       Radiation treatment dates:   10/20/2012 through 11/27/2012  Site/dose:   Right chest region 50.4 Gy in 28 fractions  Beams/energy:   3-D conformal therapy using 4 beams  Narrative: The patient tolerated radiation treatment relatively well.   She did have some esophageal problems which was helped well with Carafate. Towards the end of her therapy she developed significant fatigue and did remain out of work concerning this issue.  Plan: The patient has completed radiation treatment. The patient will return to radiation oncology clinic for routine followup in one month. I advised them to call or return sooner if they have any questions or concerns related to their recovery or treatment.  -----------------------------------  Billie Lade, PhD, MD

## 2012-12-22 ENCOUNTER — Encounter: Payer: Self-pay | Admitting: Radiation Oncology

## 2012-12-24 ENCOUNTER — Encounter: Payer: Self-pay | Admitting: Radiation Oncology

## 2012-12-24 ENCOUNTER — Ambulatory Visit
Admission: RE | Admit: 2012-12-24 | Discharge: 2012-12-24 | Disposition: A | Discharge status: Home / Self Care | Payer: PRIVATE HEALTH INSURANCE | Source: Ambulatory Visit | Attending: Radiation Oncology | Admitting: Radiation Oncology

## 2012-12-24 NOTE — Progress Notes (Signed)
Radiation Oncology         [phone] ________________________________  Name: Tracy Dodson MRN: [id number]  Date: 12/24/2012  DOB: 03/15/1951  Follow-Up Visit Note  CC: [name], MD  Jodean Lima, MD  Diagnosis:   stage IIIA (T1 B., N2, M0) non-small cell lung cancer, suspicious for adenocarcinoma.    Interval Since Last Radiation:  1  months  Narrative:  The patient returns today for routine follow-up.  She seems to be doing well this time. She denies any pain within the chest area cough or breathing problems. She denies any swallowing difficulties. Her appetite is good. She denies any new bony pain headaches dizziness or blurred vision. Patient has started back to work.                              ALLERGIES:  has No Known Allergies.  Meds: Current Outpatient Prescriptions  Medication Sig Dispense Refill  . acetaminophen (TYLENOL ARTHRITIS PAIN) 650 MG CR tablet Take 1,300 mg by mouth 3 (three) times daily.      [name] b complex vitamins tablet Take 1 tablet by mouth daily.      [name] BIOTIN 5000 PO Take 1 tablet by mouth daily.      . Calcium Carbonate-Vitamin D (CALCIUM 600+D) 600-200 MG-UNIT TABS Take 1 tablet by mouth daily.      . citalopram (CELEXA) 20 MG tablet Take 30 mg by mouth daily. Patient uses 1 and one-half tablets.      [name] glucosamine-chondroitin 500-400 MG tablet Take 1 tablet by mouth daily.      [name] losartan-hydrochlorothiazide (HYZAAR) 100-25 MG per tablet Take 1 tablet by mouth daily.      . Milk Thistle 1000 MG CAPS Take 1 capsule by mouth daily.      . Omega-3 Fatty Acids (FISH OIL) 1000 MG CAPS Take 1 capsule by mouth daily.      . predniSONE (STERAPRED UNI-PAK) 5 MG TABS Take 5 mg by mouth every morning.      . prochlorperazine (COMPAZINE) 10 MG tablet Take 1 tablet (10 mg total) by mouth every 6 (six) hours as needed.  30 tablet  0  . simvastatin (ZOCOR) 40 MG tablet Take 40 mg by mouth every evening.      . Wound Cleansers (RADIAPLEX EX) Apply topically.       [name] LORazepam (ATIVAN) 0.5 MG tablet Take 0.5 mg by mouth 2 (two) times daily.      . nicotine polacrilex (COMMIT) 2 MG lozenge Place 2 mg inside cheek as needed.      . potassium chloride SA (K-DUR,KLOR-CON) 20 MEQ tablet Take 2 tablets (40 mEq total) by mouth daily.  10 tablet  0  . PredniSONE 1 MG TBEC Take 1 tablet by mouth every morning.       . sucralfate (CARAFATE) 1 GM/10ML suspension Take 1 g by mouth 4 (four) times daily. Refill called into [facility] [address] 420 ml with 2 refills       No current facility-administered medications for this encounter.    Physical Findings: The patient is in no acute distress. Patient is alert and oriented.  weight is 160 lb (72.576 kg). Her temperature is 98.3 F (36.8 C). Her blood pressure is 114/60 and her pulse is 75. Her oxygen saturation is 98%. . No palpable supraclavicular or axillary adenopathy. The lungs are clear to auscultation. The heart has a regular rhythm and rate.  Lab Findings: Lab Results  Component Value Date   WBC 4.0 11/30/2012   HGB 7.9* 11/30/2012   HCT 25.2* 11/30/2012   MCV 91.3 11/30/2012   PLT 95 11/30/2012    @LASTCHEM @  Radiographic Findings: No results found.  Impression:  The patient is recovering from the effects of radiation.   Plan:  PET scan is planned for March 31 and followup with Dr. Arbutus Ped on April 2 to determine if the patient is a surgical candidate. Routine followup in radiation oncology in 3 months.  _____________________________________  -----------------------------------  Billie Lade, PhD, MD

## 2012-12-24 NOTE — Progress Notes (Signed)
Ms. Furlough here for follow up after 28 fractions to her right chest region.  She denies pain at this time. She states that her fatigue is improving and she has started working part time.  Her taste is also back to normal and she does not have a sore throat or trouble swallowing.  She denies shortness of breath and is able to go up a flight of stairs without becoming short of breath. Her skin on her right chest and back is intact with slight hyperpigmentation.  She continues to use Radiaplex gel.

## 2012-12-28 ENCOUNTER — Encounter (HOSPITAL_COMMUNITY): Payer: Self-pay

## 2012-12-28 ENCOUNTER — Ambulatory Visit (HOSPITAL_COMMUNITY)
Admission: RE | Admit: 2012-12-28 | Discharge: 2012-12-28 | Disposition: A | Discharge status: Home / Self Care | Payer: PRIVATE HEALTH INSURANCE | Source: Ambulatory Visit | Attending: Physician Assistant | Admitting: Physician Assistant

## 2012-12-28 DIAGNOSIS — E279 Disorder of adrenal gland, unspecified: Secondary | ICD-10-CM | POA: Insufficient documentation

## 2012-12-28 DIAGNOSIS — Z9221 Personal history of antineoplastic chemotherapy: Secondary | ICD-10-CM | POA: Insufficient documentation

## 2012-12-28 DIAGNOSIS — J984 Other disorders of lung: Secondary | ICD-10-CM | POA: Insufficient documentation

## 2012-12-28 DIAGNOSIS — Z87891 Personal history of nicotine dependence: Secondary | ICD-10-CM | POA: Insufficient documentation

## 2012-12-28 DIAGNOSIS — C3490 Malignant neoplasm of unspecified part of unspecified bronchus or lung: Secondary | ICD-10-CM

## 2012-12-28 DIAGNOSIS — Z923 Personal history of irradiation: Secondary | ICD-10-CM | POA: Insufficient documentation

## 2012-12-28 DIAGNOSIS — K8020 Calculus of gallbladder without cholecystitis without obstruction: Secondary | ICD-10-CM | POA: Insufficient documentation

## 2012-12-28 MED ORDER — IOHEXOL 300 MG/ML  SOLN
80.0000 mL | Freq: Once | INTRAMUSCULAR | Status: AC | PRN
  Administered 2012-12-28: 80 mL via INTRAVENOUS

## 2012-12-30 ENCOUNTER — Encounter: Payer: Self-pay | Admitting: Internal Medicine

## 2012-12-30 ENCOUNTER — Other Ambulatory Visit (HOSPITAL_BASED_OUTPATIENT_CLINIC_OR_DEPARTMENT_OTHER): Payer: PRIVATE HEALTH INSURANCE | Admitting: Lab

## 2012-12-30 ENCOUNTER — Ambulatory Visit (HOSPITAL_BASED_OUTPATIENT_CLINIC_OR_DEPARTMENT_OTHER): Payer: PRIVATE HEALTH INSURANCE | Admitting: Internal Medicine

## 2012-12-30 DIAGNOSIS — C3490 Malignant neoplasm of unspecified part of unspecified bronchus or lung: Secondary | ICD-10-CM

## 2012-12-30 DIAGNOSIS — C342 Malignant neoplasm of middle lobe, bronchus or lung: Secondary | ICD-10-CM

## 2012-12-30 LAB — CBC WITH DIFFERENTIAL/PLATELET
Basophils Absolute: 0 10*3/uL (ref 0.0–0.1)
EOS%: 0.1 % (ref 0.0–7.0)
Eosinophils Absolute: 0 10*3/uL (ref 0.0–0.5)
HGB: 9.5 g/dL — ABNORMAL LOW (ref 11.6–15.9)
LYMPH%: 7.4 % — ABNORMAL LOW (ref 14.0–49.7)
MCH: 29.6 pg (ref 25.1–34.0)
MCV: 90.9 fL (ref 79.5–101.0)
MONO%: 11.1 % (ref 0.0–14.0)
NEUT#: 4.3 10*3/uL (ref 1.5–6.5)
Platelets: 317 10*3/uL (ref 145–400)
RBC: 3.21 10*6/uL — ABNORMAL LOW (ref 3.70–5.45)

## 2012-12-30 LAB — COMPREHENSIVE METABOLIC PANEL (CC13)
ALT: 32 U/L (ref 0–55)
AST: 29 U/L (ref 5–34)
Albumin: 3.7 g/dL (ref 3.5–5.0)
Alkaline Phosphatase: 96 U/L (ref 40–150)
BUN: 13.5 mg/dL (ref 7.0–26.0)
CO2: 28 meq/L (ref 22–29)
Calcium: 9.4 mg/dL (ref 8.4–10.4)
Chloride: 101 meq/L (ref 98–107)
Creatinine: 0.8 mg/dL (ref 0.6–1.1)
Glucose: 134 mg/dL — ABNORMAL HIGH (ref 70–99)
Potassium: 3.8 meq/L (ref 3.5–5.1)
Sodium: 139 meq/L (ref 136–145)
Total Bilirubin: 0.37 mg/dL (ref 0.20–1.20)
Total Protein: 6.9 g/dL (ref 6.4–8.3)

## 2012-12-30 NOTE — Patient Instructions (Signed)
You have partial improvement in your disease after the concurrent chemoradiation. I will refer him to cardiothoracic surgery for consideration of surgical resection.
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
## 2013-01-05 ENCOUNTER — Telehealth: Payer: Self-pay | Admitting: *Deleted

## 2013-01-05 NOTE — Telephone Encounter (Signed)
Spoke with pt regarding an appt with Dr. Tyrone Sage 01/14/13 at 3:45.  She verbalized understanding of time and place of appt

## 2013-01-14 ENCOUNTER — Encounter: Payer: Self-pay | Admitting: Cardiothoracic Surgery

## 2013-01-14 ENCOUNTER — Institutional Professional Consult (permissible substitution) (INDEPENDENT_AMBULATORY_CARE_PROVIDER_SITE_OTHER): Payer: PRIVATE HEALTH INSURANCE | Admitting: Cardiothoracic Surgery

## 2013-01-14 VITALS — BP 119/77 | HR 90 | Resp 20 | Ht 66.0 in | Wt 156.0 lb

## 2013-01-14 DIAGNOSIS — R222 Localized swelling, mass and lump, trunk: Secondary | ICD-10-CM

## 2013-01-14 DIAGNOSIS — C3491 Malignant neoplasm of unspecified part of right bronchus or lung: Secondary | ICD-10-CM

## 2013-01-14 DIAGNOSIS — R918 Other nonspecific abnormal finding of lung field: Secondary | ICD-10-CM

## 2013-01-14 DIAGNOSIS — C3490 Malignant neoplasm of unspecified part of unspecified bronchus or lung: Secondary | ICD-10-CM

## 2013-01-18 ENCOUNTER — Other Ambulatory Visit: Payer: Self-pay | Admitting: *Deleted

## 2013-01-18 DIAGNOSIS — R918 Other nonspecific abnormal finding of lung field: Secondary | ICD-10-CM

## 2013-01-18 DIAGNOSIS — C3490 Malignant neoplasm of unspecified part of unspecified bronchus or lung: Secondary | ICD-10-CM
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
## 2013-01-20 ENCOUNTER — Ambulatory Visit (HOSPITAL_COMMUNITY)
Admission: RE | Admit: 2013-01-20 | Discharge: 2013-01-20 | Disposition: A | Discharge status: Home / Self Care | Payer: PRIVATE HEALTH INSURANCE | Source: Ambulatory Visit | Attending: Cardiothoracic Surgery | Admitting: Cardiothoracic Surgery

## 2013-01-20 ENCOUNTER — Encounter (HOSPITAL_COMMUNITY): Payer: PRIVATE HEALTH INSURANCE

## 2013-01-20 DIAGNOSIS — C3490 Malignant neoplasm of unspecified part of unspecified bronchus or lung: Secondary | ICD-10-CM | POA: Insufficient documentation

## 2013-01-20 DIAGNOSIS — R918 Other nonspecific abnormal finding of lung field: Secondary | ICD-10-CM

## 2013-01-20 DIAGNOSIS — R0989 Other specified symptoms and signs involving the circulatory and respiratory systems: Secondary | ICD-10-CM | POA: Insufficient documentation

## 2013-01-20 DIAGNOSIS — R0609 Other forms of dyspnea: Secondary | ICD-10-CM | POA: Insufficient documentation

## 2013-01-20 DIAGNOSIS — J988 Other specified respiratory disorders: Secondary | ICD-10-CM | POA: Insufficient documentation

## 2013-01-20 LAB — PULMONARY FUNCTION TEST

## 2013-01-20 MED ORDER — ALBUTEROL SULFATE (5 MG/ML) 0.5% IN NEBU
2.5000 mg | INHALATION_SOLUTION | Freq: Once | RESPIRATORY_TRACT | Status: AC
  Administered 2013-01-20: 2.5 mg via RESPIRATORY_TRACT

## 2013-01-21 ENCOUNTER — Ambulatory Visit: Payer: PRIVATE HEALTH INSURANCE | Admitting: Cardiothoracic Surgery

## 2013-01-22 ENCOUNTER — Encounter (HOSPITAL_COMMUNITY)
Admission: RE | Admit: 2013-01-22 | Discharge: 2013-01-22 | Disposition: A | Discharge status: Home / Self Care | Payer: PRIVATE HEALTH INSURANCE | Source: Ambulatory Visit | Attending: Cardiothoracic Surgery | Admitting: Cardiothoracic Surgery

## 2013-01-22 DIAGNOSIS — K449 Diaphragmatic hernia without obstruction or gangrene: Secondary | ICD-10-CM | POA: Insufficient documentation

## 2013-01-22 DIAGNOSIS — C3490 Malignant neoplasm of unspecified part of unspecified bronchus or lung: Secondary | ICD-10-CM | POA: Insufficient documentation

## 2013-01-22 DIAGNOSIS — K4090 Unilateral inguinal hernia, without obstruction or gangrene, not specified as recurrent: Secondary | ICD-10-CM | POA: Insufficient documentation

## 2013-01-22 DIAGNOSIS — R918 Other nonspecific abnormal finding of lung field: Secondary | ICD-10-CM | POA: Insufficient documentation

## 2013-01-22 DIAGNOSIS — D3500 Benign neoplasm of unspecified adrenal gland: Secondary | ICD-10-CM | POA: Insufficient documentation

## 2013-01-22 DIAGNOSIS — K8020 Calculus of gallbladder without cholecystitis without obstruction: Secondary | ICD-10-CM | POA: Insufficient documentation

## 2013-01-22 LAB — GLUCOSE, CAPILLARY: Glucose-Capillary: 108 mg/dL — ABNORMAL HIGH (ref 70–99)

## 2013-01-22 MED ORDER — FLUDEOXYGLUCOSE F - 18 (FDG) INJECTION
17.0000 | Freq: Once | INTRAVENOUS | Status: AC | PRN
  Administered 2013-01-22: 17 via INTRAVENOUS

## 2013-01-26 ENCOUNTER — Ambulatory Visit: Payer: PRIVATE HEALTH INSURANCE | Admitting: Cardiothoracic Surgery

## 2013-01-28 ENCOUNTER — Encounter: Payer: Self-pay | Admitting: Cardiothoracic Surgery

## 2013-01-28 ENCOUNTER — Other Ambulatory Visit: Payer: Self-pay | Admitting: *Deleted

## 2013-01-28 ENCOUNTER — Ambulatory Visit (INDEPENDENT_AMBULATORY_CARE_PROVIDER_SITE_OTHER): Payer: PRIVATE HEALTH INSURANCE | Admitting: Cardiothoracic Surgery

## 2013-01-28 VITALS — BP 111/73 | HR 100 | Resp 18 | Ht 66.0 in | Wt 156.0 lb

## 2013-01-28 DIAGNOSIS — R222 Localized swelling, mass and lump, trunk: Secondary | ICD-10-CM

## 2013-01-28 DIAGNOSIS — R918 Other nonspecific abnormal finding of lung field: Secondary | ICD-10-CM

## 2013-01-28 DIAGNOSIS — C3491 Malignant neoplasm of unspecified part of right bronchus or lung: Secondary | ICD-10-CM

## 2013-01-28 DIAGNOSIS — C3490 Malignant neoplasm of unspecified part of unspecified bronchus or lung: Secondary | ICD-10-CM

## 2013-01-28 NOTE — Patient Instructions (Signed)
Stop Losartin May 10    Lung Resection A lung resection is surgery to remove a lung. When an entire lung is removed, the procedure is called a pneumonectomy. When only part of a lung is removed, the procedure is called a lobectomy. A lung resection is typically done to get rid of a tumor or cancer. This surgery can help relieve some or all of your symptoms. The surgery can also help keep the problem from getting worse. It may provide the best chance for curing your disease. However, surgery may not necessarily cure lung cancer, if that is the problem. Most people need to stay in the hospital for several days after this procedure.  LET YOUR CAREGIVER KNOW ABOUT:  Allergies to food or medicine.  Medicines taken, including vitamins, herbs, eyedrops, over-the-counter medicines, and creams.  Use of steroids (by mouth or creams).  Previous problems with anesthetics or numbing medicines.  History of bleeding problems or blood clots.  Previous surgery.  Other health problems, including diabetes and kidney problems.  Possibility of pregnancy, if this applies. RISKS AND COMPLICATIONS  Lung resections have been done for many years with good results and few complications. However, all surgery is associated with possible risks. Some of these risks are:  Excessive bleeding.  Infection.  Inability to breath without a ventilator.  Persistent shortness of breath.  Heart problems, including abnormal rhythms and a risk of heart attack or heart failure.  Blood clots.  Injury to a blood vessel.  Injury to a nerve.  Failure to heal properly.  Stroke.  Bronchopleural fistula. This is a small hole between one of the main breathing tubes and the lining of the lungs. BEFORE THE PROCEDURE  In order to prepare for surgery, your caregiver may ask for several tests to be done. These may include:  Blood tests.  Urine tests.  X-rays.  Imaging tests, such as CT scans, MRI scans, and PET scans.  These tests are done to find the exact size and location of the tumor that will be removed.  Pulmonary function tests (PFTs). These are breathing tests to assess the function of your lungs before surgery and to decide how to best help your breathing after surgery.  Heart testing. This is done to make sure your heart is strong enough for the procedure.  Bronchoscopy. This is a technique that allows your caregiver to look at the inside of your airways. This is done using a soft, flexible tube (bronchoscope). Along with imaging tests, this can help your caregiver know the exact location and size of the area that will be removed during surgery.  Lymph node sampling. This may need to be done to see if the tumor has spread. It may be done as a separate surgery or right before your lung resection procedure. PROCEDURE  An intravenous line (IV) will be placed in your arm. You will be given medicine that makes you sleep (general anesthetic).  Once you are asleep, a breathing tube is placed into your windpipe. You may also get pain medicine through a thin, flexible tube (catheter) in your back. The catheter is put through your skin and next to your spinal cord, where it releases anesthetic medicine.  Next, you will be turned onto your side. This makes it easier for your surgeon to reach the area of your ribcage where the surgical cut (incision) will be made. This area is washed with a disinfectant solution and might also be shaved. A catheter will be put into your bladder  to collect urine. Another tube will be carefully passed through your throat and into your stomach.  The surgeon will make an incision on your side, which will start between two of your ribs and go around to your back. Your ribs will be spread and held open. Part of one rib may be removed to make it easier for the surgeon to reach your lung.  Your surgeon will carefully cut the veins, arteries, and bronchus leading to the lung. After being  cut, each of these pieces will be sewn or stapled closed. Then, the lung or part of the lung will be removed.  Your surgeon will check inside your chest to make sure there is no bleeding in or around the lungs. Lymph nodes near the lung may also be removed for later tests. This is done to check if your problems have spread to the lymph nodes.  Depending on your situation, your surgeon may put tubes into your chest to drain extra fluid and air from the chest cavity after surgery. After the tubes are in, your ribcage will be closed with stitches. The stitches help your ribcage heal and keep it from moving. After this, the layers of tissue under the skin are closed with more stitches, which will dissolve inside your body over time. Finally, your skin is closed with stitches or staples and covered with a bandage. AFTER THE PROCEDURE   After surgery, you will be taken to the recovery area where a nurse will monitor your progress. You may still have a breathing tube, spinal catheter, bladder catheter, stomach tube, and possibly chest tubes inside your body. These will be removed during your recovery. You may be put on a respirator following surgery if some assistance is needed to help your breathing. When you are awake, stable, and without complications, you will likely continue recovery in the intensive care unit (ICU).  As you wake up, you might feel some aches and pains in your chest and throat. Sometimes during recovery, patients may shiver or feel nauseous. Both of these symptoms are temporary and may be caused by the anesthesia. Your caregivers can give you medicine to help these problems go away.  The breathing tube will be taken out as soon as your caregivers feel you can breathe on your own. For most people, this happens on the same day as the surgery.  If your surgery and time in the ICU go well, most of the tubes and equipment will be taken out within the first 1 to 2 days after surgery. This is  about how long most people stay in the ICU. You may need to stay longer, depending on how you are doing.  You should also start respiratory therapy in the ICU. This therapy uses breathing exercises to help your other lung stay healthy and get stronger.  As you improve, you will be moved to a regular hospital room for continued respiratory therapy, help with your bladder and bowels, and to continue medicines. Most people stay in the hospital for 5 to 7 days. However, your stay may be longer, depending on how your surgery went and how well you are doing.  After your lung or part of your lung is taken out, there will be a space inside your chest. This space will often fill up with fluid over time. The amount of time this takes is different for each person. Because your chest needs to fill with fluid, your surgeon may or may not put a drainage tube in  your chest. If there is a chest tube, it will most likely be removed within 24 hours after the surgery.  You will receive care until you are doing well and your caregiver feels it is safe for you to go home or to transfer to an extended care facility. Document Released: 12/07/2002 Document Revised: 12/09/2011 Document Reviewed: 05/16/2011 ExitCare Patient Information 2013 ExitCare, [facility]  Lung Resection Care After Refer to this sheet in the next few weeks. These instructions provide you with information on caring for yourself after your procedure. Your caregiver may also give you more specific instructions. Your treatment has been planned according to current medical practices, but problems sometimes occur. Call your caregiver if you have any problems or questions after your procedure. HOME CARE INSTRUCTIONS  You may resume a normal diet and activities as directed.  Do not smoke or use tobacco products.  Change your bandages (dressings) as directed.  Only take over-the-counter or prescription medicines for pain, discomfort, or fever as directed by  your caregiver.  Keep all follow-up appointments as directed.  Try to breathe deeply and cough as directed. Holding a pillow firmly over your ribs may help with discomfort.  If you were given an incentive spirometer in the hospital, continue to use it as directed.  Walk as directed by your caregiver.  You may take a shower and gently wash the area of your surgical cut (incision) with water and soap as directed. Do not use anything else to clean your incision except as directed by your caregiver. Do not take baths or sit in a hot tub. SEEK MEDICAL CARE IF:  You notice redness, swelling, or increasing pain in the incision.  You are bleeding from the incision.  You see pus coming from the incision.  You notice a bad smell coming from the incision or dressing.  Your incision breaks open.  You cough up blood or pus, or you develop a cough that produces bad smelling sputum.  You have pain or swelling in your legs.  You have increasing pain that is not controlled with medicine.  You have trouble managing any of the tubes that have been left in place after surgery. SEEK IMMEDIATE MEDICAL CARE IF:   You have a fever or chills.  You have any reaction or side effects to medicines given.  You have chest pain or an irregular or rapid heartbeat.  You have dizzy episodes or fainting.  You have shortness of breath or difficulty breathing.  You have persistent nausea or vomiting.  You have a rash. MAKE SURE YOU:  Understand these instructions.  Will watch your condition.  Will get help right away if you are not doing well or get worse. Document Released: 04/05/2005 Document Revised: 12/09/2011 Document Reviewed: 05/16/2011 [facility] Patient Information 2013 [place], [place].
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
## 2013-02-01 ENCOUNTER — Encounter: Payer: Self-pay | Admitting: Cardiothoracic Surgery

## 2013-02-01 ENCOUNTER — Encounter (HOSPITAL_COMMUNITY): Payer: Self-pay | Admitting: Pharmacy Technician

## 2013-02-04 ENCOUNTER — Encounter (HOSPITAL_COMMUNITY)
Admission: RE | Admit: 2013-02-04 | Discharge: 2013-02-04 | Disposition: A | Discharge status: Home / Self Care | Payer: PRIVATE HEALTH INSURANCE | Source: Ambulatory Visit | Attending: Cardiothoracic Surgery | Admitting: Cardiothoracic Surgery

## 2013-02-04 ENCOUNTER — Encounter (HOSPITAL_COMMUNITY): Payer: Self-pay

## 2013-02-04 VITALS — BP 111/71 | HR 69 | Temp 98.8°F | Resp 20 | Ht 64.0 in | Wt 158.5 lb

## 2013-02-04 DIAGNOSIS — R918 Other nonspecific abnormal finding of lung field: Secondary | ICD-10-CM

## 2013-02-04 HISTORY — DX: Polymyalgia rheumatica: M35.3

## 2013-02-04 LAB — CBC
HCT: 34.1 % — ABNORMAL LOW (ref 36.0–46.0)
Hemoglobin: 10.6 g/dL — ABNORMAL LOW (ref 12.0–15.0)
MCH: 25.7 pg — ABNORMAL LOW (ref 26.0–34.0)
MCHC: 31.1 g/dL (ref 30.0–36.0)
MCV: 82.6 fL (ref 78.0–100.0)
Platelets: 200 10*3/uL (ref 150–400)
RBC: 4.13 MIL/uL (ref 3.87–5.11)
RDW: 17 % — ABNORMAL HIGH (ref 11.5–15.5)
WBC: 7.7 10*3/uL (ref 4.0–10.5)

## 2013-02-04 LAB — COMPREHENSIVE METABOLIC PANEL
ALT: 18 U/L (ref 0–35)
AST: 30 U/L (ref 0–37)
Albumin: 4 g/dL (ref 3.5–5.2)
Alkaline Phosphatase: 94 U/L (ref 39–117)
BUN: 10 mg/dL (ref 6–23)
CO2: 29 mEq/L (ref 19–32)
Calcium: 9.5 mg/dL (ref 8.4–10.5)
Chloride: 101 mEq/L (ref 96–112)
Creatinine, Ser: 0.67 mg/dL (ref 0.50–1.10)
GFR calc Af Amer: >90 mL/min (ref >90)
GFR calc non Af Amer: >90 mL/min (ref >90)
Glucose, Bld: 94 mg/dL (ref 70–99)
Potassium: 4 mEq/L (ref 3.5–5.1)
Sodium: 139 mEq/L (ref 135–145)
Total Bilirubin: 0.3 mg/dL (ref 0.3–1.2)
Total Protein: 7.3 g/dL (ref 6.0–8.3)

## 2013-02-04 LAB — TYPE AND SCREEN
ABO/RH(D): O POS
Antibody Screen: NEGATIVE

## 2013-02-04 LAB — BLOOD GAS, ARTERIAL
Acid-Base Excess: 2.2 mmol/L — ABNORMAL HIGH (ref 0.0–2.0)
Bicarbonate: 25.7 mEq/L — ABNORMAL HIGH (ref 20.0–24.0)
Drawn by: 344381
FIO2: 0.21 %
O2 Saturation: 96.6 %
Patient temperature: 98.6
TCO2: 26.8 mmol/L (ref 0–100)
pCO2 arterial: 36.3 mmHg (ref 35.0–45.0)
pH, Arterial: 7.464 — ABNORMAL HIGH (ref 7.350–7.450)
pO2, Arterial: 85.8 mmHg (ref 80.0–100.0)

## 2013-02-04 LAB — PROTIME-INR
INR: 0.98 (ref 0.00–1.49)
Prothrombin Time: 12.9 seconds (ref 11.6–15.2)

## 2013-02-04 LAB — URINALYSIS, ROUTINE W REFLEX MICROSCOPIC
Bilirubin Urine: NEGATIVE
Glucose, UA: NEGATIVE mg/dL
Hgb urine dipstick: NEGATIVE
Ketones, ur: NEGATIVE mg/dL
Nitrite: NEGATIVE
Protein, ur: NEGATIVE mg/dL
Specific Gravity, Urine: 1.007 (ref 1.005–1.030)
Urobilinogen, UA: 0.2 mg/dL (ref 0.0–1.0)
pH: 7 (ref 5.0–8.0)

## 2013-02-04 LAB — URINE MICROSCOPIC-ADD ON

## 2013-02-04 LAB — SURGICAL PCR SCREEN
MRSA, PCR: NEGATIVE
Staphylococcus aureus: NEGATIVE

## 2013-02-04 LAB — ABO/RH: ABO/RH(D): O POS

## 2013-02-04 LAB — APTT: aPTT: 34 seconds (ref 24–37)

## 2013-02-04 NOTE — Pre-Procedure Instructions (Signed)
Tracy Dodson  02/04/2013   Your procedure is scheduled on:  May 12  Report to [facility] at 05:30 AM.  Call this number if you have problems the morning of surgery: [phone]   Remember:   Do not eat food or drink liquids after midnight.   Take these medicines the morning of surgery with A SIP OF WATER: Tylenol (if needed), Prednisone, Celexa   STOP Fish Oil, Glucosamine, Vitamin B, Biotin, Calcium, Milk Thistle today   Do not wear jewelry, make-up or nail polish.  Do not wear lotions, powders, or perfumes. You may wear deodorant.  Do not shave 48 hours prior to surgery. Men may shave face and neck.  Do not bring valuables to the hospital.  Contacts, dentures or bridgework may not be worn into surgery.  Leave suitcase in the car. After surgery it may be brought to your room.  For patients admitted to the hospital, checkout time is 11:00 AM the day of discharge.   Special Instructions: Shower using CHG 2 nights before surgery and the night before surgery.  If you shower the day of surgery use CHG.  Use special wash - you have one bottle of CHG for all showers.  You should use approximately 1/3 of the bottle for each shower.   Please read over the following fact sheets that you were given: Pain Booklet, Coughing and Deep Breathing and Surgical Site Infection Prevention

## 2013-02-05 ENCOUNTER — Encounter: Payer: Self-pay | Admitting: Cardiothoracic Surgery

## 2013-02-07 MED ORDER — CEFUROXIME SODIUM 1.5 G IJ SOLR
1.5000 g | INTRAMUSCULAR | Status: AC
  Administered 2013-02-08: 1.5 g via INTRAVENOUS
  Filled 2013-02-07: qty 1.5

## 2013-02-08 ENCOUNTER — Inpatient Hospital Stay (HOSPITAL_COMMUNITY): Payer: PRIVATE HEALTH INSURANCE

## 2013-02-08 ENCOUNTER — Encounter (HOSPITAL_COMMUNITY)
Admission: RE | Disposition: A | Discharge status: Home / Self Care | Payer: Self-pay | Source: Ambulatory Visit | Attending: Cardiothoracic Surgery

## 2013-02-08 ENCOUNTER — Inpatient Hospital Stay (HOSPITAL_COMMUNITY)
Admission: RE | Admit: 2013-02-08 | Discharge: 2013-02-12 | DRG: 164 | Disposition: A | Discharge status: Home / Self Care | Payer: PRIVATE HEALTH INSURANCE | Source: Ambulatory Visit | Attending: Cardiothoracic Surgery | Admitting: Cardiothoracic Surgery

## 2013-02-08 ENCOUNTER — Encounter (HOSPITAL_COMMUNITY): Payer: Self-pay | Admitting: Anesthesiology

## 2013-02-08 ENCOUNTER — Ambulatory Visit (HOSPITAL_COMMUNITY): Payer: PRIVATE HEALTH INSURANCE | Admitting: Anesthesiology

## 2013-02-08 ENCOUNTER — Ambulatory Visit (HOSPITAL_COMMUNITY): Payer: PRIVATE HEALTH INSURANCE

## 2013-02-08 ENCOUNTER — Encounter (HOSPITAL_COMMUNITY): Payer: Self-pay | Admitting: *Deleted

## 2013-02-08 DIAGNOSIS — J8410 Pulmonary fibrosis, unspecified: Secondary | ICD-10-CM | POA: Diagnosis present

## 2013-02-08 DIAGNOSIS — K589 Irritable bowel syndrome without diarrhea: Secondary | ICD-10-CM | POA: Diagnosis present

## 2013-02-08 DIAGNOSIS — M1710 Unilateral primary osteoarthritis, unspecified knee: Secondary | ICD-10-CM | POA: Diagnosis present

## 2013-02-08 DIAGNOSIS — K209 Esophagitis, unspecified without bleeding: Secondary | ICD-10-CM | POA: Diagnosis present

## 2013-02-08 DIAGNOSIS — I10 Essential (primary) hypertension: Secondary | ICD-10-CM | POA: Diagnosis present

## 2013-02-08 DIAGNOSIS — F411 Generalized anxiety disorder: Secondary | ICD-10-CM | POA: Diagnosis present

## 2013-02-08 DIAGNOSIS — F3289 Other specified depressive episodes: Secondary | ICD-10-CM | POA: Diagnosis present

## 2013-02-08 DIAGNOSIS — M353 Polymyalgia rheumatica: Secondary | ICD-10-CM | POA: Diagnosis present

## 2013-02-08 DIAGNOSIS — E785 Hyperlipidemia, unspecified: Secondary | ICD-10-CM | POA: Diagnosis present

## 2013-02-08 DIAGNOSIS — J90 Pleural effusion, not elsewhere classified: Secondary | ICD-10-CM | POA: Diagnosis not present

## 2013-02-08 DIAGNOSIS — IMO0002 Reserved for concepts with insufficient information to code with codable children: Secondary | ICD-10-CM

## 2013-02-08 DIAGNOSIS — R918 Other nonspecific abnormal finding of lung field: Secondary | ICD-10-CM

## 2013-02-08 DIAGNOSIS — R599 Enlarged lymph nodes, unspecified: Secondary | ICD-10-CM | POA: Diagnosis present

## 2013-02-08 DIAGNOSIS — D381 Neoplasm of uncertain behavior of trachea, bronchus and lung: Secondary | ICD-10-CM

## 2013-02-08 DIAGNOSIS — Z87891 Personal history of nicotine dependence: Secondary | ICD-10-CM

## 2013-02-08 DIAGNOSIS — C3410 Malignant neoplasm of upper lobe, unspecified bronchus or lung: Principal | ICD-10-CM | POA: Diagnosis present

## 2013-02-08 DIAGNOSIS — M3130 Wegener's granulomatosis without renal involvement: Secondary | ICD-10-CM

## 2013-02-08 DIAGNOSIS — D62 Acute posthemorrhagic anemia: Secondary | ICD-10-CM | POA: Diagnosis not present

## 2013-02-08 DIAGNOSIS — F329 Major depressive disorder, single episode, unspecified: Secondary | ICD-10-CM | POA: Diagnosis present

## 2013-02-08 DIAGNOSIS — Z79899 Other long term (current) drug therapy: Secondary | ICD-10-CM

## 2013-02-08 DIAGNOSIS — C342 Malignant neoplasm of middle lobe, bronchus or lung: Secondary | ICD-10-CM | POA: Diagnosis present

## 2013-02-08 HISTORY — PX: VIDEO ASSISTED THORACOSCOPY (VATS)/WEDGE RESECTION: SHX6174

## 2013-02-08 HISTORY — PX: VIDEO BRONCHOSCOPY: SHX5072

## 2013-02-08 SURGERY — BRONCHOSCOPY, VIDEO-ASSISTED
Anesthesia: General | Site: Chest | Laterality: Right | Wound class: Clean Contaminated

## 2013-02-08 MED ORDER — ACETAMINOPHEN 10 MG/ML IV SOLN
1000.0000 mg | Freq: Four times a day (QID) | INTRAVENOUS | Status: AC
  Administered 2013-02-08 – 2013-02-09 (×4): 1000 mg via INTRAVENOUS
  Filled 2013-02-08 (×4): qty 100

## 2013-02-08 MED ORDER — DIPHENHYDRAMINE HCL 50 MG/ML IJ SOLN: 12.5000 mg | Freq: Four times a day (QID) | INTRAMUSCULAR | Status: DC | PRN

## 2013-02-08 MED ORDER — HYDROCORTISONE SOD SUCCINATE 100 MG IJ SOLR
50.0000 mg | Freq: Two times a day (BID) | INTRAMUSCULAR | Status: AC
Start: 2013-02-09 — End: 2013-02-09
  Administered 2013-02-09 (×2): 50 mg via INTRAVENOUS
  Filled 2013-02-08 (×2): qty 1

## 2013-02-08 MED ORDER — SIMVASTATIN 40 MG PO TABS
40.0000 mg | ORAL_TABLET | Freq: Every day | ORAL | Status: DC
  Administered 2013-02-08 – 2013-02-11 (×4): 40 mg via ORAL
  Filled 2013-02-08 (×5): qty 1

## 2013-02-08 MED ORDER — PROPOFOL 10 MG/ML IV BOLUS
INTRAVENOUS | Status: DC | PRN
  Administered 2013-02-08: 140 mg via INTRAVENOUS
  Administered 2013-02-08: 50 mg via INTRAVENOUS

## 2013-02-08 MED ORDER — SUCCINYLCHOLINE CHLORIDE 20 MG/ML IJ SOLN
INTRAMUSCULAR | Status: DC | PRN
  Administered 2013-02-08: 100 mg via INTRAVENOUS

## 2013-02-08 MED ORDER — MIDAZOLAM HCL 5 MG/5ML IJ SOLN
INTRAMUSCULAR | Status: DC | PRN
  Administered 2013-02-08: 2 mg via INTRAVENOUS

## 2013-02-08 MED ORDER — POTASSIUM CHLORIDE 10 MEQ/50ML IV SOLN
10.0000 meq | Freq: Every day | INTRAVENOUS | Status: DC | PRN
  Filled 2013-02-08: qty 150
  Filled 2013-02-08: qty 50

## 2013-02-08 MED ORDER — OXYCODONE-ACETAMINOPHEN 5-325 MG PO TABS
1.0000 | ORAL_TABLET | ORAL | Status: DC | PRN
  Administered 2013-02-09 (×2): 2 via ORAL
  Administered 2013-02-10 (×4): 1 via ORAL
  Filled 2013-02-08: qty 2

## 2013-02-08 MED ORDER — BUPIVACAINE 0.5 % ON-Q PUMP SINGLE CATH 400 ML
400.0000 mL | INJECTION | Status: DC
  Filled 2013-02-08: qty 400

## 2013-02-08 MED ORDER — DEXTROSE 5 % IV SOLN
1.5000 g | Freq: Two times a day (BID) | INTRAVENOUS | Status: AC
  Administered 2013-02-08 – 2013-02-09 (×2): 1.5 g via INTRAVENOUS
  Filled 2013-02-08 (×2): qty 1.5

## 2013-02-08 MED ORDER — DEXTROSE-NACL 5-0.9 % IV SOLN
INTRAVENOUS | Status: DC
  Administered 2013-02-08: 15:00:00 via INTRAVENOUS

## 2013-02-08 MED ORDER — CITALOPRAM HYDROBROMIDE 20 MG PO TABS
30.0000 mg | ORAL_TABLET | Freq: Every day | ORAL | Status: DC
  Administered 2013-02-09 – 2013-02-11 (×3): 30 mg via ORAL
  Filled 2013-02-08 (×4): qty 1

## 2013-02-08 MED ORDER — ONDANSETRON HCL 4 MG/2ML IJ SOLN
INTRAMUSCULAR | Status: DC | PRN
  Administered 2013-02-08: 4 mg via INTRAVENOUS

## 2013-02-08 MED ORDER — ONDANSETRON HCL 4 MG/2ML IJ SOLN: 4.0000 mg | Freq: Four times a day (QID) | INTRAMUSCULAR | Status: DC | PRN

## 2013-02-08 MED ORDER — HYDROMORPHONE HCL PF 1 MG/ML IJ SOLN
INTRAMUSCULAR | Status: AC
  Administered 2013-02-08: 0.25 mg
  Filled 2013-02-08: qty 1

## 2013-02-08 MED ORDER — OXYCODONE-ACETAMINOPHEN 5-325 MG PO TABS
1.0000 | ORAL_TABLET | ORAL | Status: DC | PRN
  Administered 2013-02-09: 1 via ORAL
  Administered 2013-02-09: 2 via ORAL
  Administered 2013-02-11: 1 via ORAL
  Filled 2013-02-08: qty 1
  Filled 2013-02-08 (×2): qty 2
  Filled 2013-02-08 (×4): qty 1

## 2013-02-08 MED ORDER — FENTANYL CITRATE 0.05 MG/ML IJ SOLN
INTRAMUSCULAR | Status: DC | PRN
  Administered 2013-02-08 (×2): 50 ug via INTRAVENOUS
  Administered 2013-02-08: 100 ug via INTRAVENOUS
  Administered 2013-02-08: 50 ug via INTRAVENOUS
  Administered 2013-02-08: 100 ug via INTRAVENOUS

## 2013-02-08 MED ORDER — NALOXONE HCL 0.4 MG/ML IJ SOLN: 0.4000 mg | INTRAMUSCULAR | Status: DC | PRN

## 2013-02-08 MED ORDER — ROCURONIUM BROMIDE 100 MG/10ML IV SOLN
INTRAVENOUS | Status: DC | PRN
  Administered 2013-02-08: 10 mg via INTRAVENOUS
  Administered 2013-02-08: 50 mg via INTRAVENOUS

## 2013-02-08 MED ORDER — PHENYLEPHRINE HCL 10 MG/ML IJ SOLN
INTRAMUSCULAR | Status: DC | PRN
  Administered 2013-02-08 (×4): 40 ug via INTRAVENOUS
  Administered 2013-02-08 (×2): 80 ug via INTRAVENOUS
  Administered 2013-02-08 (×3): 40 ug via INTRAVENOUS

## 2013-02-08 MED ORDER — HYDROCORTISONE SOD SUCCINATE 100 MG IJ SOLR
100.0000 mg | Freq: Once | INTRAMUSCULAR | Status: AC
  Administered 2013-02-08: 100 mg via INTRAVENOUS
  Filled 2013-02-08: qty 2

## 2013-02-08 MED ORDER — OXYCODONE HCL 5 MG/5ML PO SOLN: 5.0000 mg | Freq: Once | ORAL | Status: DC | PRN

## 2013-02-08 MED ORDER — 0.9 % SODIUM CHLORIDE (POUR BTL) OPTIME
TOPICAL | Status: DC | PRN
  Administered 2013-02-08: 1000 mL
  Administered 2013-02-08: 400 mL

## 2013-02-08 MED ORDER — FENTANYL 10 MCG/ML IV SOLN
INTRAVENOUS | Status: DC
  Administered 2013-02-08: 12:00:00 via INTRAVENOUS
  Administered 2013-02-08: 120 ug via INTRAVENOUS
  Administered 2013-02-08: 60 ug via INTRAVENOUS
  Administered 2013-02-08: 45 ug via INTRAVENOUS
  Administered 2013-02-09: 30 ug via INTRAVENOUS
  Filled 2013-02-08: qty 50

## 2013-02-08 MED ORDER — NEOSTIGMINE METHYLSULFATE 1 MG/ML IJ SOLN
INTRAMUSCULAR | Status: DC | PRN
  Administered 2013-02-08: 3 mg via INTRAVENOUS

## 2013-02-08 MED ORDER — BISACODYL 5 MG PO TBEC
10.0000 mg | DELAYED_RELEASE_TABLET | Freq: Every day | ORAL | Status: DC
  Administered 2013-02-08: 10 mg via ORAL
  Filled 2013-02-08: qty 2

## 2013-02-08 MED ORDER — HYDROMORPHONE HCL PF 1 MG/ML IJ SOLN
0.2500 mg | INTRAMUSCULAR | Status: DC | PRN
  Administered 2013-02-08 (×3): 0.25 mg via INTRAVENOUS

## 2013-02-08 MED ORDER — HYDROCORTISONE SOD SUCCINATE 100 MG IJ SOLR
INTRAMUSCULAR | Status: DC | PRN
  Administered 2013-02-08: 100 mg via INTRAVENOUS

## 2013-02-08 MED ORDER — DIPHENHYDRAMINE HCL 12.5 MG/5ML PO ELIX
12.5000 mg | ORAL_SOLUTION | Freq: Four times a day (QID) | ORAL | Status: DC | PRN
  Filled 2013-02-08: qty 5

## 2013-02-08 MED ORDER — SODIUM CHLORIDE 0.9 % IJ SOLN: 9.0000 mL | INTRAMUSCULAR | Status: DC | PRN

## 2013-02-08 MED ORDER — OXYCODONE HCL 5 MG PO TABS
5.0000 mg | ORAL_TABLET | ORAL | Status: AC | PRN
  Administered 2013-02-09: 10 mg via ORAL
  Filled 2013-02-08 (×2): qty 2

## 2013-02-08 MED ORDER — TRAMADOL HCL 50 MG PO TABS
50.0000 mg | ORAL_TABLET | Freq: Four times a day (QID) | ORAL | Status: DC | PRN
  Administered 2013-02-10 – 2013-02-11 (×3): 100 mg via ORAL
  Filled 2013-02-08 (×3): qty 2

## 2013-02-08 MED ORDER — LACTATED RINGERS IV SOLN
INTRAVENOUS | Status: DC | PRN
  Administered 2013-02-08 (×2): via INTRAVENOUS

## 2013-02-08 MED ORDER — ONDANSETRON HCL 4 MG/2ML IJ SOLN
4.0000 mg | Freq: Four times a day (QID) | INTRAMUSCULAR | Status: DC | PRN
  Administered 2013-02-09: 4 mg via INTRAVENOUS
  Filled 2013-02-08: qty 2

## 2013-02-08 MED ORDER — LACTATED RINGERS IV SOLN
INTRAVENOUS | Status: DC | PRN
  Administered 2013-02-08: 07:00:00 via INTRAVENOUS

## 2013-02-08 MED ORDER — GLYCOPYRROLATE 0.2 MG/ML IJ SOLN
INTRAMUSCULAR | Status: DC | PRN
  Administered 2013-02-08: 0.4 mg via INTRAVENOUS

## 2013-02-08 MED ORDER — OXYCODONE HCL 5 MG PO TABS: 5.0000 mg | ORAL_TABLET | Freq: Once | ORAL | Status: DC | PRN

## 2013-02-08 SURGICAL SUPPLY — 84 items
APL SRG 22X2 LUM MLBL SLNT (VASCULAR PRODUCTS)
APL SRG 7X2 LUM MLBL SLNT (VASCULAR PRODUCTS)
APPLICATOR TIP COSEAL (VASCULAR PRODUCTS) IMPLANT
APPLICATOR TIP EXT COSEAL (VASCULAR PRODUCTS) IMPLANT
BLADE SURG 11 STRL SS (BLADE) IMPLANT
BRUSH CYTOL CELLEBRITY 1.5X140 (MISCELLANEOUS) IMPLANT
CANISTER SUCTION 2500CC (MISCELLANEOUS) ×3 IMPLANT
CATH KIT ON Q 5IN SLV (PAIN MANAGEMENT) IMPLANT
CATH THORACIC 28FR (CATHETERS) ×2 IMPLANT
CATH THORACIC 36FR (CATHETERS) IMPLANT
CATH THORACIC 36FR RT ANG (CATHETERS) IMPLANT
CLIP TI MEDIUM 6 (CLIP) IMPLANT
CLOTH BEACON ORANGE TIMEOUT ST (SAFETY) ×1 IMPLANT
CONT SPEC 4OZ CLIKSEAL STRL BL (MISCELLANEOUS) ×8 IMPLANT
COVER TABLE BACK 60X90 (DRAPES) ×3 IMPLANT
DRAPE LAPAROSCOPIC ABDOMINAL (DRAPES) ×3 IMPLANT
DRAPE WARM FLUID 44X44 (DRAPE) ×3 IMPLANT
DRILL BIT 7/64X5 (BIT) IMPLANT
ELECT BLADE 4.0 EZ CLEAN MEGAD (MISCELLANEOUS) ×3
ELECT REM PT RETURN 9FT ADLT (ELECTROSURGICAL) ×3
ELECTRODE BLDE 4.0 EZ CLN MEGD (MISCELLANEOUS) ×2 IMPLANT
ELECTRODE REM PT RTRN 9FT ADLT (ELECTROSURGICAL) ×2 IMPLANT
FORCEPS BIOP RJ4 1.8 (CUTTING FORCEPS) IMPLANT
GLOVE BIO SURGEON STRL SZ 6.5 (GLOVE) ×7 IMPLANT
GLOVE BIOGEL PI IND STRL 7.0 (GLOVE) IMPLANT
GLOVE BIOGEL PI INDICATOR 7.0 (GLOVE) ×2
GLOVE SS BIOGEL STRL SZ 7.5 (GLOVE) IMPLANT
GLOVE SUPERSENSE BIOGEL SZ 7.5 (GLOVE) ×2
GOWN STRL NON-REIN LRG LVL3 (GOWN DISPOSABLE) ×13 IMPLANT
HANDLE STAPLE ENDO GIA SHORT (STAPLE) ×1
KIT BASIN OR (CUSTOM PROCEDURE TRAY) ×3 IMPLANT
KIT ROOM TURNOVER OR (KITS) ×3 IMPLANT
KIT SUCTION CATH 14FR (SUCTIONS) ×3 IMPLANT
MARKER SKIN DUAL TIP RULER LAB (MISCELLANEOUS) ×2 IMPLANT
NDL BIOPSY TRANSBRONCH 21G (NEEDLE) IMPLANT
NEEDLE BIOPSY TRANSBRONCH 21G (NEEDLE) IMPLANT
NS IRRIG 1000ML POUR BTL (IV SOLUTION) ×6 IMPLANT
OIL SILICONE PENTAX (PARTS (SERVICE/REPAIRS)) ×3 IMPLANT
PACK CHEST (CUSTOM PROCEDURE TRAY) ×3 IMPLANT
PAD ARMBOARD 7.5X6 YLW CONV (MISCELLANEOUS) ×6 IMPLANT
PUMP ON Q 400MLX5ML/HR (PAIN MANAGEMENT) ×2 IMPLANT
RELOAD EGIA 45 MED/THCK PURPLE (STAPLE) ×3 IMPLANT
RELOAD EGIA 60 MED/THCK PURPLE (STAPLE) ×3 IMPLANT
RELOAD EGIA TRIS TAN 45 CVD (STAPLE) ×6 IMPLANT
RELOAD STAPLE 45 TAN MED CVD (STAPLE) IMPLANT
RELOAD STAPLE 60 MED/THCK ART (STAPLE) IMPLANT
SCISSORS LAP 5X35 DISP (ENDOMECHANICALS) IMPLANT
SEALANT PROGEL (MISCELLANEOUS) IMPLANT
SEALANT SURG COSEAL 4ML (VASCULAR PRODUCTS) IMPLANT
SEALANT SURG COSEAL 8ML (VASCULAR PRODUCTS) IMPLANT
SOLUTION ANTI FOG 6CC (MISCELLANEOUS) ×3 IMPLANT
SPONGE GAUZE 4X4 12PLY (GAUZE/BANDAGES/DRESSINGS) ×3 IMPLANT
STAPLER ENDO GIA 12 SHRT THIN (STAPLE) IMPLANT
STAPLER ENDO GIA 12MM SHORT (STAPLE) ×2 IMPLANT
SUT PROLENE 3 0 SH DA (SUTURE) IMPLANT
SUT PROLENE 4 0 RB 1 (SUTURE)
SUT PROLENE 4-0 RB1 .5 CRCL 36 (SUTURE) IMPLANT
SUT SILK  1 MH (SUTURE) ×2
SUT SILK 1 MH (SUTURE) ×4 IMPLANT
SUT SILK 2 0 SH (SUTURE) IMPLANT
SUT SILK 2 0SH CR/8 30 (SUTURE) IMPLANT
SUT SILK 3 0SH CR/8 30 (SUTURE) ×1 IMPLANT
SUT STEEL 1 (SUTURE) IMPLANT
SUT VIC AB 1 CTX 18 (SUTURE) ×2 IMPLANT
SUT VIC AB 1 CTX 36 (SUTURE)
SUT VIC AB 1 CTX36XBRD ANBCTR (SUTURE) IMPLANT
SUT VIC AB 2-0 CTX 36 (SUTURE) ×1 IMPLANT
SUT VIC AB 2-0 UR6 27 (SUTURE) ×1 IMPLANT
SUT VIC AB 3-0 SH 8-18 (SUTURE) IMPLANT
SUT VIC AB 3-0 X1 27 (SUTURE) ×1 IMPLANT
SUT VICRYL 2 TP 1 (SUTURE) ×2 IMPLANT
SWAB COLLECTION DEVICE MRSA (MISCELLANEOUS) IMPLANT
SYR 20ML ECCENTRIC (SYRINGE) ×5 IMPLANT
SYSTEM SAHARA CHEST DRAIN ATS (WOUND CARE) ×3 IMPLANT
TAPE CLOTH SURG 4X10 WHT LF (GAUZE/BANDAGES/DRESSINGS) ×1 IMPLANT
TIP APPLICATOR SPRAY EXTEND 16 (VASCULAR PRODUCTS) IMPLANT
TOWEL OR 17X24 6PK STRL BLUE (TOWEL DISPOSABLE) ×6 IMPLANT
TOWEL OR 17X26 10 PK STRL BLUE (TOWEL DISPOSABLE) ×6 IMPLANT
TRAP SPECIMEN MUCOUS 40CC (MISCELLANEOUS) ×3 IMPLANT
TRAY FOLEY CATH 14FRSI W/METER (CATHETERS) ×3 IMPLANT
TUBE ANAEROBIC SPECIMEN COL (MISCELLANEOUS) IMPLANT
TUBE CONNECTING 12X1/4 (SUCTIONS) ×6 IMPLANT
TUNNELER SHEATH ON-Q 11GX8 (MISCELLANEOUS) ×3 IMPLANT
WATER STERILE IRR 1000ML POUR (IV SOLUTION) ×6 IMPLANT
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
## 2013-02-08 NOTE — Progress Notes (Signed)
Patient ID: Tracy Dodson, female   DOB: May 26, 1951, 62 y.o.   MRN: [id number]   SICU Evening Rounds:  Hemodynamically stable  Pain under control  Urine output good  CT output low.  Stable postop course.

## 2013-02-08 NOTE — Anesthesia Procedure Notes (Signed)
Procedure Name: Intubation Date/Time: 02/08/2013 7:39 AM Performed by: Ferol Luz L Pre-anesthesia Checklist: Patient identified, Emergency Drugs available, Suction available, Patient being monitored and Timeout performed Patient Re-evaluated:Patient Re-evaluated prior to inductionOxygen Delivery Method: Circle system utilized Preoxygenation: Pre-oxygenation with 100% oxygen Intubation Type: Combination inhalational/ intravenous induction Laryngoscope Size: Mac and 3 Grade View: Grade I Endobronchial tube: Left, Double lumen EBT and EBT position confirmed by fiberoptic bronchoscope and 35 Fr Number of attempts: 1 Airway Equipment and Method: Stylet Placement Confirmation: ETT inserted through vocal cords under direct vision,  positive ETCO2 and breath sounds checked- equal and bilateral Tube secured with: Tape Dental Injury: Teeth and Oropharynx as per pre-operative assessment  Comments: Smooth passage and exchange of single lumen ett to double lumen ebt s trauma

## 2013-02-08 NOTE — Anesthesia Preprocedure Evaluation (Signed)
Anesthesia Evaluation  Patient identified by MRN, date of birth, ID band Patient awake    Reviewed: Allergy & Precautions, H&P , NPO status , Patient's Chart, lab work & pertinent test results  Airway Mallampati: II  Neck ROM: full    Dental   Pulmonary former smoker,  Lung mass         Cardiovascular hypertension,     Neuro/Psych Anxiety Depression  Neuromuscular disease    GI/Hepatic   Endo/Other    Renal/GU      Musculoskeletal   Abdominal   Peds  Hematology   Anesthesia Other Findings   Reproductive/Obstetrics                           Anesthesia Physical Anesthesia Plan  ASA: II  Anesthesia Plan: General   Post-op Pain Management:    Induction: Intravenous  Airway Management Planned: Double Lumen EBT  Additional Equipment: Arterial line, CVP and Ultrasound Guidance Line Placement  Intra-op Plan:   Post-operative Plan: Extubation in OR  Informed Consent: I have reviewed the patients History and Physical, chart, labs and discussed the procedure including the risks, benefits and alternatives for the proposed anesthesia with the patient or authorized representative who has indicated his/her understanding and acceptance.     Plan Discussed with: CRNA, Anesthesiologist and Surgeon  Anesthesia Plan Comments:         Anesthesia Quick Evaluation

## 2013-02-08 NOTE — Anesthesia Postprocedure Evaluation (Signed)
Anesthesia Post Note  Patient: Tracy Dodson  Procedure(s) Performed: Procedure(s) (LRB): VIDEO BRONCHOSCOPY (N/A) VIDEO ASSISTED THORACOSCOPY (VATS)/WEDGE RESECTION (Right)  Anesthesia type: General  Patient location: PACU  Post pain: Pain level controlled and Adequate analgesia  Post assessment: Post-op Vital signs reviewed, Patient's Cardiovascular Status Stable, Respiratory Function Stable, Patent Airway and Pain level controlled  Last Vitals:  Filed Vitals:   02/08/13 1135  BP: 119/59  Pulse: 58  Temp:   Resp: 16    Post vital signs: Reviewed and stable  Level of consciousness: awake, alert  and oriented  Complications: No apparent anesthesia complications

## 2013-02-08 NOTE — Transfer of Care (Signed)
Immediate Anesthesia Transfer of Care Note  Patient: Tracy Dodson  Procedure(s) Performed: Procedure(s) with comments: VIDEO BRONCHOSCOPY (N/A) VIDEO ASSISTED THORACOSCOPY (VATS)/WEDGE RESECTION (Right) - (R) VATS, LUNG RESECTION, MIDDLE LOBECTOMY, POSSIBLE WEDGE RIGHT LOWER LOBE  Patient Location: PACU  Anesthesia Type:General  Level of Consciousness: awake, alert  and patient cooperative  Airway & Oxygen Therapy: Patient Spontanous Breathing and Patient connected to nasal cannula oxygen  Post-op Assessment: Report given to PACU RN, Post -op Vital signs reviewed and stable and Patient moving all extremities  Post vital signs: Reviewed and stable  Complications: No apparent anesthesia complications

## 2013-02-08 NOTE — Brief Op Note (Addendum)
02/08/2013  10:21 AM  PATIENT:  Tracy Dodson  62 y.o. female  PRE-OPERATIVE DIAGNOSIS:  (R) Middle Lobe Lung Cancer, ? Lesion rt lower lobe  POST-OPERATIVE DIAGNOSIS: same  PROCEDURE:  Procedure(s): VIDEO BRONCHOSCOPY VIDEO ASSISTED THORACOSCOPY (VATS)/WEDGE RESECTION RLL, AND RML LOBECTOMY LN SAMPLING Placement of ON q device  SURGEON:  Surgeon(s): Delight Ovens, MD  PHYSICIAN ASSISTANT: WAYNE GOLD PA-C  ANESTHESIA:   general  SPECIMEN:  Source of Specimen:  RML AND RLL SUPERIOR SEGMENT WEDGE  DISPOSITION OF SPECIMEN:  Pathology  DRAINS: 1 Chest Tube(s) in the RIGHT HEMITHORAX   PATIENT CONDITION:  PACU - hemodynamically stable.  PRE-OPERATIVE WEIGHT: 71.8kg  COMPLICATIONS: NO KNOWN

## 2013-02-09 ENCOUNTER — Encounter (HOSPITAL_COMMUNITY): Payer: Self-pay | Admitting: Cardiothoracic Surgery

## 2013-02-09 ENCOUNTER — Inpatient Hospital Stay (HOSPITAL_COMMUNITY): Payer: PRIVATE HEALTH INSURANCE

## 2013-02-09 LAB — BASIC METABOLIC PANEL
CO2: 26 mEq/L (ref 19–32)
Calcium: 8.3 mg/dL — ABNORMAL LOW (ref 8.4–10.5)
Chloride: 108 mEq/L (ref 96–112)
Sodium: 140 mEq/L (ref 135–145)

## 2013-02-09 LAB — POCT I-STAT 3, ART BLOOD GAS (G3+)
Acid-Base Excess: 3 mmol/L — ABNORMAL HIGH (ref 0.0–2.0)
Bicarbonate: 27.4 mEq/L — ABNORMAL HIGH (ref 20.0–24.0)
O2 Saturation: 98 %
Patient temperature: 98.2
TCO2: 29 mmol/L (ref 0–100)
pCO2 arterial: 42.1 mmHg (ref 35.0–45.0)
pH, Arterial: 7.421 (ref 7.350–7.450)
pO2, Arterial: 95 mmHg (ref 80.0–100.0)

## 2013-02-09 LAB — CBC
MCH: 25.5 pg — ABNORMAL LOW (ref 26.0–34.0)
Platelets: 178 10*3/uL (ref 150–400)
RBC: 3.25 MIL/uL — ABNORMAL LOW (ref 3.87–5.11)
WBC: 7.2 10*3/uL (ref 4.0–10.5)

## 2013-02-09 MED ORDER — DIPHENHYDRAMINE HCL 25 MG PO CAPS
25.0000 mg | ORAL_CAPSULE | Freq: Every evening | ORAL | Status: DC | PRN
  Administered 2013-02-09 – 2013-02-10 (×2): 25 mg via ORAL
  Filled 2013-02-09 (×2): qty 1

## 2013-02-09 MED ORDER — ENOXAPARIN SODIUM 30 MG/0.3ML ~~LOC~~ SOLN
30.0000 mg | SUBCUTANEOUS | Status: DC
  Administered 2013-02-09 – 2013-02-11 (×3): 30 mg via SUBCUTANEOUS
  Filled 2013-02-09 (×4): qty 0.3

## 2013-02-09 MED ORDER — LOPERAMIDE HCL 2 MG PO CAPS
2.0000 mg | ORAL_CAPSULE | ORAL | Status: DC | PRN
  Administered 2013-02-09 (×2): 2 mg via ORAL
  Filled 2013-02-09 (×3): qty 1

## 2013-02-09 MED ORDER — POTASSIUM CHLORIDE 10 MEQ/50ML IV SOLN
10.0000 meq | INTRAVENOUS | Status: AC
  Administered 2013-02-09 (×3): 10 meq via INTRAVENOUS

## 2013-02-09 NOTE — Progress Notes (Signed)
Patient ID: Tracy Dodson, female   DOB: 1951-02-13, 62 y.o.   MRN: [id number] TCTS DAILY PROGRESS NOTE                   [address]            [place] [address]          [phone]      1 Day Post-Op Procedure(s) (LRB): VIDEO BRONCHOSCOPY (N/A) VIDEO ASSISTED THORACOSCOPY (VATS)/WEDGE RESECTION (Right)  Total Length of Stay:  LOS: 1 day   Subjective: Feels well up to chair, sob  Objective: Vital signs in last 24 hours: Temp:  [97.3 F (36.3 C)-98.2 F (36.8 C)] 97.9 F (36.6 C) (05/13 0730) Pulse Rate:  [58-85] 82 (05/13 0430) Cardiac Rhythm:  [-] Normal sinus rhythm (05/12 2000) Resp:  [13-24] 24 (05/13 0430) BP: (102-153)/(48-88) 126/56 mmHg (05/13 0430) SpO2:  [95 %-100 %] 97 % (05/13 0430) Arterial Line BP: (62-154)/(48-100) 71/64 mmHg (05/13 0430)  There were no vitals filed for this visit.  Weight change:    Hemodynamic parameters for last 24 hours:    Intake/Output from previous day: 05/12 0701 - 05/13 0700 In: 3536 [P.O.:480; I.V.:2706; IV Piggyback:350] Out: 4190 [Urine:3660; Blood:150; Chest Tube:380]  Intake/Output this shift:    Current Meds: Scheduled Meds: . bisacodyl  10 mg Oral Daily  . cefUROXime (ZINACEF)  IV  1.5 g Intravenous Q12H  . citalopram  30 mg Oral Daily  . fentaNYL   Intravenous Q4H  . hydrocortisone sod succinate (SOLU-CORTEF) inj  50 mg Intravenous Q12H  . potassium chloride  10 mEq Intravenous Q1 Hr x 3  . simvastatin  40 mg Oral q1800   Continuous Infusions: . dextrose 5 % and 0.9% NaCl 100 mL/hr at 02/08/13 1448   PRN Meds:.diphenhydrAMINE, diphenhydrAMINE, loperamide, naloxone, ondansetron (ZOFRAN) IV, ondansetron (ZOFRAN) IV, oxyCODONE, oxyCODONE-acetaminophen, oxyCODONE-acetaminophen, potassium chloride, sodium chloride, traMADol  General appearance: alert, cooperative and no distress Neurologic: intact Heart: regular rate and rhythm, S1, S2 normal, no murmur, click, rub or gallop and normal  apical impulse Lungs: clear to auscultation bilaterally and normal percussion bilaterally Abdomen: soft, non-tender; bowel sounds normal; no masses,  no organomegaly Extremities: extremities normal, atraumatic, no cyanosis or edema and Homans sign is negative, no sign of DVT Wound: no air leak  Lab Results: CBC: Recent Labs  02/09/13 0400  WBC 7.2  HGB 8.3*  HCT 27.5*  PLT 178   BMET:  Recent Labs  02/09/13 0400  NA 140  K 3.7  CL 108  CO2 26  GLUCOSE 113*  BUN 3*  CREATININE 0.54  CALCIUM 8.3*    PT/INR: No results found for this basename: LABPROT, INR,  in the last 72 hours Radiology: Dg Chest 2 View  02/08/2013  *RADIOLOGY REPORT*  Clinical Data: 62 year old female preoperative study.  Lung mass.  CHEST - 2 VIEW  Comparison: PET CT 01/22/2013 and earlier.  Findings: Coarse opacity in the right lower lung in part corresponding to the area of the hypermetabolic right middle lobe mass.  Stable lung volumes.  Stable cardiac size and mediastinal contours.  No pneumothorax, pulmonary edema or pleural effusion. No new cardiopulmonary opacity. Stable visualized osseous structures.  IMPRESSION: Stable right lower lung findings. No new cardiopulmonary abnormality.   Original Report Authenticated By: Erskine Speed, M.D.    Dg Chest Portable 1 View  02/08/2013  *RADIOLOGY REPORT*  Clinical Data: Central line placement.  PORTABLE CHEST - 1 VIEW  Comparison:  02/08/2013  Findings: 1057 hours.  Asymmetric elevation of the right hemidiaphragm noted.  Right chest tube visualized without evidence for right-sided pneumothorax.  The right sided central line tracks cranially over the right neck, likely in the right internal jugular vein.  Tip is not included on the film.  Mild vascular congestion without overt airspace pulmonary edema.  Underlying chronic interstitial coarsening noted. The cardiopericardial silhouette is enlarged. Telemetry leads overlie the chest.  IMPRESSION: Right chest tube without  evidence for pneumothorax.  Right  central  line tracks cranially in the neck,  likely  in  the right internal jugular vein.   Original Report Authenticated By: Kennith Center, M.D.      Assessment/Plan: S/P Procedure(s) (LRB): VIDEO BRONCHOSCOPY (N/A) VIDEO ASSISTED THORACOSCOPY (VATS)/WEDGE RESECTION (Right) Mobilize Diabetes control Plan for transfer to step-down: see transfer orders Expected Acute  Blood - loss Anemia    Kayan Blissett B 02/09/2013 7:41 AM

## 2013-02-09 NOTE — Progress Notes (Signed)
Patient ID: Tracy Dodson, female   DOB: [birth year], 62 y.o.   MRN: [id number]                   [address]            [place] [address]          [phone]     1 Day Post-Op Procedure(s) (LRB): VIDEO BRONCHOSCOPY (N/A) VIDEO ASSISTED THORACOSCOPY (VATS)/WEDGE RESECTION (Right)  Total Length of Stay:  LOS: 1 day  BP 121/67  Pulse 82  Temp(Src) 98.2 F (36.8 C) (Oral)  Resp 21  SpO2 100%  .Intake/Output     05/13 0701 - 05/14 0700   P.O. 360   I.V. 300   IV Piggyback 200   Total Intake 860   Urine 1645   Blood    Chest Tube 50   Total Output 1695   Net -835         . dextrose 5 % and 0.9% NaCl 20 mL/hr at 02/09/13 0746     Lab Results  Component Value Date   WBC 7.2 02/09/2013   HGB 8.3* 02/09/2013   HCT 27.5* 02/09/2013   PLT 178 02/09/2013   GLUCOSE 113* 02/09/2013   ALT 18 02/04/2013   AST 30 02/04/2013   NA 140 02/09/2013   K 3.7 02/09/2013   CL 108 02/09/2013   CREATININE 0.54 02/09/2013   BUN 3* 02/09/2013   CO2 26 02/09/2013   INR 0.98 02/04/2013   Waiting for 3300 bed Walking in sicu   Delight Ovens MD  Beeper [phone] Office [phone] 02/09/2013 8:31 PM

## 2013-02-09 NOTE — Progress Notes (Addendum)
Wasted 13cc of Fentanyl PCA flushed down sink witnessed by Ulis Rias, RN

## 2013-02-09 NOTE — Progress Notes (Signed)
Utilization review completed. Brayln Duque, RN, BSN. 

## 2013-02-10 ENCOUNTER — Inpatient Hospital Stay (HOSPITAL_COMMUNITY): Payer: PRIVATE HEALTH INSURANCE

## 2013-02-10 DIAGNOSIS — C3490 Malignant neoplasm of unspecified part of unspecified bronchus or lung: Secondary | ICD-10-CM

## 2013-02-10 DIAGNOSIS — J852 Abscess of lung without pneumonia: Secondary | ICD-10-CM

## 2013-02-10 LAB — COMPREHENSIVE METABOLIC PANEL
Albumin: 2.4 g/dL — ABNORMAL LOW (ref 3.5–5.2)
BUN: 4 mg/dL — ABNORMAL LOW (ref 6–23)
Creatinine, Ser: 0.53 mg/dL (ref 0.50–1.10)
Total Bilirubin: 0.2 mg/dL — ABNORMAL LOW (ref 0.3–1.2)
Total Protein: 5.6 g/dL — ABNORMAL LOW (ref 6.0–8.3)

## 2013-02-10 LAB — CBC
HCT: 27.1 % — ABNORMAL LOW (ref 36.0–46.0)
MCHC: 31 g/dL (ref 30.0–36.0)
MCV: 85.2 fL (ref 78.0–100.0)
RDW: 17.1 % — ABNORMAL HIGH (ref 11.5–15.5)

## 2013-02-10 LAB — CRYPTOCOCCAL ANTIGEN: Crypto Ag: NEGATIVE

## 2013-02-10 NOTE — Plan of Care (Signed)
Problem: Phase II Progression Outcomes Goal: Discharge plan established Outcome: Completed/Met Date Met:  02/10/13 Home with spouse

## 2013-02-10 NOTE — Progress Notes (Signed)
Pt ambulated 400 ft in hallway with NT using front wheel walker. Pt tolerated well, back in chair resting. Will continue to monitor.

## 2013-02-10 NOTE — Op Note (Signed)
NAMEBONNA, Tracy Dodson NO.:  [id number]  MEDICAL RECORD NO.:  [id number]  LOCATION:  2304                         FACILITY:  [facility]  PHYSICIAN:  Sheliah Plane, MD    DATE OF BIRTH:  March 03, 1951  DATE OF PROCEDURE:  02/08/2013 DATE OF DISCHARGE:                              OPERATIVE REPORT   PREOPERATIVE DIAGNOSIS:  Carcinoma of the right middle lobe, status post radiation and chemotherapy treatment.  POSTOPERATIVE DIAGNOSIS:  Carcinoma of the right middle lobe, status post radiation and chemotherapy treatment.  SURGICAL PROCEDURE:  Right video-assisted thoracoscopy and thoracotomy, right middle lobectomy, wedge resection of superior segment of right lower lobe, lymph node sampling, placement of On-Q device, and bronchoscopy,  INDICATIONS:  The patient is a 62 year old former smoker who presented in December 2013 with a right middle lobe carcinoma biopsy with EBUS by Dr. Delton Coombes.  At that time, she had PET scan evidence of a right hilar lymph node.  Biopsy was reported from 4R node.  She underwent course of radiation and chemotherapy which she tolerated well.  The scanning including a PET scan showed decrease in size of a primary masses and no mediastinal activity on PET scan.  There was a small infiltrative appearing area in the superior segment of the right lower lobe that was mildly hypermetabolic.  The patient had somewhat limited pulmonary reserve adequate for a middle lobectomy.  Risks and options were discussed with the patient in detail.  She was willing to proceed.  DESCRIPTION OF PROCEDURE:  After appropriate time-out, the patient underwent general endotracheal anesthesia without incident.  Single- lumen endotracheal tube was placed.  Through this, a fiberoptic bronchoscope was passed to the subsegmental level in both the right and left lung without evidence of endobronchial lesions.  The scope was then removed.  Double-lumen endotracheal tube was  placed.  The patient was turned to lateral decubitus position with the right side up. A port site was created at  fourth intercostal space this, video scope was passed.  The patient had no significant adhesions in the chest.  There was evidence of edema along the fissures, presumably secondary to radiation.  A small thoracotomy incision was then created between this and the 2 port sites were proceeded with dissection of the middle lobe.  The fissures were relatively well delineated.  The venous drainage to the middle lobe was identified and stapled with a vascular stapler.  Continue with the dissection along the lower lobe of pulmonary artery and located the arterial branch to the middle lobe which was in a similar fashion stapled with vascular stapler.  The middle lobe bronchus was divided with purple staplers and the  fissures was completed with stapler.  The specimen was removed and submitted to Pathology, confirming the presence of the primary tumor and negative margins.  There was a less than 1-cm area in the superior segment of the lower lobe which was correlated with the radiographic findings and a wedge resection  with 2 purple staplers through the port site.  This was  and submitted to Pathology.  Frozen section on this was equivocal as far as tumor, but the margin was clear.  We then  proceeded with lymph node sampling including a 10R, 11R, 12R, and 8 lymph nodes. A single 28 chest tube was left through the port site.  An On-Q device was tunneled through a separate site subpleurally and secured in place. Incision was closed with interrupted 0 with a single pericostal stitch, drilled through the lower ribs and interrupted 0 Vicryl sutures. Subcutaneous tissue with a running 2-0 Vicryl and 3-0 subcuticular stitch in skin edges.  The chest tube was left through the more anterior port site.  The posterior port site was closed with an interrupted 2-0 Vicryl and a 3-0 subcuticular  stitch.  Prior to closure, the lung was inflated.  The bronchial stump tested without evidence of air leak. Sponge and needle count was reported as correct at the completion of procedure.  Estimated blood loss was less than 100 mL.  The patient tolerated the procedure without complication and was extubated, transferred to the recovery room for further postop care.     Sheliah Plane, MD     EG/MEDQ  D:  02/09/2013  T:  02/10/2013  Job:  [id number]

## 2013-02-10 NOTE — Progress Notes (Signed)
Patient ID: JORDI KAMM, female   DOB: 05/14/1951, 62 y.o.   MRN: [id number] TCTS DAILY PROGRESS NOTE                   [address]            [place] [address]          [phone]      2 Days Post-Op Procedure(s) (LRB): VIDEO BRONCHOSCOPY (N/A) VIDEO ASSISTED THORACOSCOPY (VATS)/WEDGE RESECTION (Right)  Total Length of Stay:  LOS: 2 days   Subjective: Feels well this am, good pain control  Objective: Vital signs in last 24 hours: Temp:  [97.8 F (36.6 C)-98.2 F (36.8 C)] 97.8 F (36.6 C) (05/14 0739) Pulse Rate:  [67-96] 69 (05/14 0700) Cardiac Rhythm:  [-] Normal sinus rhythm (05/14 0400) Resp:  [12-26] 18 (05/14 0700) BP: (92-140)/(50-71) 134/60 mmHg (05/14 0700) SpO2:  [90 %-100 %] 100 % (05/14 0700) Weight:  [164 lb 14.5 oz (74.8 kg)] 164 lb 14.5 oz (74.8 kg) (05/14 0600)  Filed Weights   02/10/13 0600  Weight: 164 lb 14.5 oz (74.8 kg)    Weight change:    Hemodynamic parameters for last 24 hours:    Intake/Output from previous day: 05/13 0701 - 05/14 0700 In: 1200 [P.O.:480; I.V.:520; IV Piggyback:200] Out: 2835 [Urine:2645; Chest Tube:190]  Intake/Output this shift:    Current Meds: Scheduled Meds: . bisacodyl  10 mg Oral Daily  . citalopram  30 mg Oral Daily  . enoxaparin  30 mg Subcutaneous Q24H  . simvastatin  40 mg Oral q1800   Continuous Infusions: . dextrose 5 % and 0.9% NaCl 20 mL/hr at 02/09/13 0746   PRN Meds:.diphenhydrAMINE, loperamide, ondansetron (ZOFRAN) IV, oxyCODONE-acetaminophen, oxyCODONE-acetaminophen, potassium chloride, traMADol  General appearance: alert, cooperative and no distress Neurologic: intact Heart: regular rate and rhythm, S1, S2 normal, no murmur, click, rub or gallop Lungs: clear to auscultation bilaterally Abdomen: soft, non-tender; bowel sounds normal; no masses,  no organomegaly Extremities: extremities normal, atraumatic, no cyanosis or edema and Homans sign is negative, no sign of  DVT Wound: no air leak on water seal  Lab Results: CBC: Recent Labs  02/09/13 0400 02/10/13 0430  WBC 7.2 6.8  HGB 8.3* 8.4*  HCT 27.5* 27.1*  PLT 178 153   BMET:  Recent Labs  02/09/13 0400 02/10/13 0430  NA 140 142  K 3.7 3.9  CL 108 106  CO2 26 29  GLUCOSE 113* 91  BUN 3* 4*  CREATININE 0.54 0.53  CALCIUM 8.3* 8.8    PT/INR: No results found for this basename: LABPROT, INR,  in the last 72 hours Radiology: Dg Chest Port 1 View  02/09/2013   *RADIOLOGY REPORT*  Clinical Data: VATS.  Wedge resection of the right chest.  PORTABLE CHEST - 1 VIEW  Comparison: 02/08/2013.  Findings: Malpositioned right subclavian central line is present extending cranially into the back.  This appears similar to 02/08/2013.  Right thoracostomy tube is present.  There is no right-sided pneumothorax identified.  Right hilar opacity is present, probably representing atelectasis and postsurgical changes.  Elevation of the right hemidiaphragm appears similar.  The left hemithorax appears unchanged. Monitoring leads are projected over the chest.  IMPRESSION:  1.  Unchanged malpositioned right subclavian central line extending into the neck with the tip not visualized. 2.  Unchanged right thoracostomy tube.  No pneumothorax identified. 3.  Postoperative changes of the right hilum with right lung volume loss.   Original  Report Authenticated By: Andreas Newport, M.D.   Dg Chest Portable 1 View  02/08/2013   *RADIOLOGY REPORT*  Clinical Data: Central line placement.  PORTABLE CHEST - 1 VIEW  Comparison: 02/08/2013  Findings: 1057 hours.  Asymmetric elevation of the right hemidiaphragm noted.  Right chest tube visualized without evidence for right-sided pneumothorax.  The right sided central line tracks cranially over the right neck, likely in the right internal jugular vein.  Tip is not included on the film.  Mild vascular congestion without overt airspace pulmonary edema.  Underlying chronic interstitial  coarsening noted. The cardiopericardial silhouette is enlarged. Telemetry leads overlie the chest.  IMPRESSION: Right chest tube without evidence for pneumothorax.  Right  central  line tracks cranially in the neck,  likely  in  the right internal jugular vein.   Original Report Authenticated By: Kennith Center, M.D.     Assessment/Plan: S/P Procedure(s) (LRB): VIDEO BRONCHOSCOPY (N/A) VIDEO ASSISTED THORACOSCOPY (VATS)/WEDGE RESECTION (Right) Mobilize d/c tubes/lines Plan for transfer to step-down: see transfer orders D/c central line and ct tube today Path pending     Nelly Scriven B 02/10/2013 8:05 AM

## 2013-02-10 NOTE — Progress Notes (Signed)
Patient transferred to 2015 via wheelchair with propak.  Tolerated transfer well.  Placed on monitor on 2000 and updated RN on patient status.  VSS upon transfer.
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
## 2013-02-10 NOTE — Progress Notes (Signed)
Per Wynona Canes, RN, pt ambulated 350 ft before transferring to unit 2000.

## 2013-02-11 ENCOUNTER — Inpatient Hospital Stay (HOSPITAL_COMMUNITY): Payer: PRIVATE HEALTH INSURANCE

## 2013-02-11 LAB — BASIC METABOLIC PANEL
BUN: 5 mg/dL — ABNORMAL LOW (ref 6–23)
CO2: 27 mEq/L (ref 19–32)
Calcium: 9 mg/dL (ref 8.4–10.5)
Chloride: 99 mEq/L (ref 96–112)
Creatinine, Ser: 0.63 mg/dL (ref 0.50–1.10)
GFR calc Af Amer: >90 mL/min (ref >90)
GFR calc non Af Amer: >90 mL/min (ref >90)
Glucose, Bld: 98 mg/dL (ref 70–99)
Potassium: 3.8 mEq/L (ref 3.5–5.1)
Sodium: 138 mEq/L (ref 135–145)

## 2013-02-11 LAB — CBC
HCT: 30.1 % — ABNORMAL LOW (ref 36.0–46.0)
Hemoglobin: 9.1 g/dL — ABNORMAL LOW (ref 12.0–15.0)
MCH: 25.6 pg — ABNORMAL LOW (ref 26.0–34.0)
MCHC: 30.2 g/dL (ref 30.0–36.0)
MCV: 84.6 fL (ref 78.0–100.0)
Platelets: 166 10*3/uL (ref 150–400)
RBC: 3.56 MIL/uL — ABNORMAL LOW (ref 3.87–5.11)
RDW: 17.2 % — ABNORMAL HIGH (ref 11.5–15.5)
WBC: 6.9 10*3/uL (ref 4.0–10.5)

## 2013-02-11 MED ORDER — FUROSEMIDE 10 MG/ML IJ SOLN
40.0000 mg | Freq: Once | INTRAMUSCULAR | Status: AC
  Administered 2013-02-11: 40 mg via INTRAVENOUS
  Filled 2013-02-11: qty 4

## 2013-02-11 MED ORDER — LOSARTAN POTASSIUM 50 MG PO TABS
100.0000 mg | ORAL_TABLET | Freq: Every day | ORAL | Status: DC
  Administered 2013-02-11: 100 mg via ORAL
  Filled 2013-02-11 (×2): qty 2

## 2013-02-11 NOTE — Progress Notes (Signed)
INFECTIOUS DISEASE PROGRESS NOTE  ID: Tracy Dodson is a 62 y.o. female with  Active Problems:   Lung cancer  Subjective: Without complaints  Abtx:  Anti-infectives   Start     Dose/Rate Route Frequency Ordered Stop   02/08/13 1930  cefUROXime (ZINACEF) 1.5 g in dextrose 5 % 50 mL IVPB     1.5 g 100 mL/hr over 30 Minutes Intravenous Every 12 hours 02/08/13 1249 02/09/13 0901   02/07/13 1544  cefUROXime (ZINACEF) 1.5 g in dextrose 5 % 50 mL IVPB     1.5 g 100 mL/hr over 30 Minutes Intravenous 60 min pre-op 02/07/13 1544 02/08/13 0740      Medications:  Scheduled: . bisacodyl  10 mg Oral Daily  . citalopram  30 mg Oral Daily  . enoxaparin  30 mg Subcutaneous Q24H  . losartan  100 mg Oral Daily  . simvastatin  40 mg Oral q1800    Objective: Vital signs in last 24 hours: Temp:  [98.3 F (36.8 C)-98.6 F (37 C)] 98.3 F (36.8 C) (05/15 1340) Pulse Rate:  [91-99] 91 (05/15 1340) Resp:  [18] 18 (05/15 1340) BP: (108-152)/(63-76) 108/63 mmHg (05/15 1340) SpO2:  [94 %-97 %] 97 % (05/15 1340) Weight:  [74 kg (163 lb 2.3 oz)] 74 kg (163 lb 2.3 oz) (05/15 0400)   General appearance: alert, cooperative and no distress Resp: clear to auscultation bilaterally Cardio: regular rate and rhythm GI: normal findings: bowel sounds normal and soft, non-tender  Lab Results  Recent Labs  02/10/13 0430 02/11/13 0450  WBC 6.8 6.9  HGB 8.4* 9.1*  HCT 27.1* 30.1*  NA 142 138  K 3.9 3.8  CL 106 99  CO2 29 27  BUN 4* 5*  CREATININE 0.53 0.63   Liver Panel  Recent Labs  02/10/13 0430  PROT 5.6*  ALBUMIN 2.4*  AST 20  ALT 16  ALKPHOS 64  BILITOT 0.2*   Sedimentation Rate No results found for this basename: ESRSEDRATE,  in the last 72 hours C-Reactive Protein No results found for this basename: CRP,  in the last 72 hours  Microbiology: Recent Results (from the past 240 hour(s))  SURGICAL PCR SCREEN     Status: None   Collection Time    02/04/13 10:33 AM   Result Value Range Status   MRSA, PCR NEGATIVE  NEGATIVE Final   Staphylococcus aureus NEGATIVE  NEGATIVE Final   Comment:            The Xpert SA Assay (FDA     approved for NASAL specimens     in patients over 52 years of age),     is one component of     a comprehensive surveillance     program.  Test performance has     been validated by [employer] for patients greater     than or equal to 51 year old.     It is not intended     to diagnose infection nor to     guide or monitor treatment.    Studies/Results: Dg Chest 2 View  02/11/2013   *RADIOLOGY REPORT*  Clinical Data: Removal of chest tube.  CHEST - 2 VIEW  Comparison: 05/14 and 02/08/2013  Findings: Right-sided chest tube has been removed.  No pneumothorax.  Tiny residual right effusion.  Slight new atelectasis adjacent to the superior aspect of the right hilum.  Left lung is clear.  Heart size and vascularity are  normal.  IMPRESSION:  1.  No pneumothorax after chest tube removal. 2.  Slight new atelectasis at the superior aspect the right hilum in the region of the surgical staples. 3.  Tiny right effusion.   Original Report Authenticated By: Francene Boyers, M.D.   Dg Chest Port 1 View  02/10/2013   *RADIOLOGY REPORT*  Clinical Data: Chest tube, follow-up  PORTABLE CHEST - 1 VIEW  Comparison: Portable chest x-ray of 02/09/2013  Findings: A right chest tube remains and no pneumothorax is seen. Slight elevation of the right hemidiaphragm is stable in cardiomegaly is stable.  The right central venous line extends cephalad as noted previously.  IMPRESSION: Right chest tube remains with no pneumothorax.  No change in position of right central venous line extending cephalad into the neck.   Original Report Authenticated By: Dwyane Dee, M.D.     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  Will see her in ID clinic to f/u her remaining labs after she d/c in AM.  Total  days of antibiotics: 0   Johny Sax Infectious Diseases [phone] 02/11/2013, 3:54 PM   LOS: 3 days

## 2013-02-11 NOTE — Progress Notes (Signed)
Nutrition Brief Note  Patient identified on the Malnutrition Screening Tool (MST) Report for recent weight loss without trying.  Per records below, patient's weight has recently increased; more than likely due to fluid.  Wt Readings from Last 10 Encounters:  02/11/13 163 lb 2.3 oz (74 kg)  02/11/13 163 lb 2.3 oz (74 kg)  02/04/13 158 lb 8 oz (71.895 kg)  01/28/13 156 lb (70.761 kg)  01/14/13 156 lb (70.761 kg)  12/30/12 156 lb 9.6 oz (71.033 kg)  12/24/12 160 lb (72.576 kg)  11/27/12 160 lb (72.576 kg)  11/24/12 159 lb (72.122 kg)  11/17/12 160 lb 1.6 oz (72.621 kg)    Body mass index is 27.58 kg/(m^2). Patient meets criteria for Overweight based on current BMI.   Current diet order is Regular, patient's average intake at meals is 60% at this time.  Labs and medications reviewed.   No nutrition interventions warranted at this time. If nutrition issues arise, please consult RD.   Maureen Chatters, RD, LDN Pager #: [phone] After-Hours Pager #: [phone]

## 2013-02-11 NOTE — Progress Notes (Signed)
Patient ambulated about 500 feet on room air,tolerated well. Tracy Dodson Jamey Reas RN

## 2013-02-11 NOTE — Progress Notes (Signed)
Pt up ambulating independently at this time; no complaints; will cont. To monitor.

## 2013-02-11 NOTE — Progress Notes (Addendum)
3 Days Post-Op Procedure(s) (LRB): VIDEO BRONCHOSCOPY (N/A) VIDEO ASSISTED THORACOSCOPY (VATS)/WEDGE RESECTION (Right) Subjective:  Ms. Tracy Dodson has no new complaints this morning.  She continues to have a dry cough.  Her pain is under control.   Objective: Vital signs in last 24 hours: Temp:  [98.5 F (36.9 C)-98.6 F (37 C)] 98.5 F (36.9 C) (05/15 0508) Pulse Rate:  [73-99] 91 (05/15 0508) Cardiac Rhythm:  [-] Normal sinus rhythm (05/15 0508) Resp:  [17-20] 18 (05/15 0508) BP: (117-152)/(63-76) 151/67 mmHg (05/15 0508) SpO2:  [94 %-100 %] 94 % (05/15 0508) Weight:  [163 lb 2.3 oz (74 kg)] 163 lb 2.3 oz (74 kg) (05/15 0400)  Intake/Output from previous day: 05/14 0701 - 05/15 0700 In: 480 [P.O.:480] Out: 1640 [Urine:1600; Chest Tube:40]  General appearance: alert, cooperative and no distress Heart: regular rate and rhythm Lungs: diminished breath sounds RLL Abdomen: soft, non-tender; bowel sounds normal; no masses,  no organomegaly Wound: clean and dry  Lab Results:  Recent Labs  02/10/13 0430 02/11/13 0450  WBC 6.8 6.9  HGB 8.4* 9.1*  HCT 27.1* 30.1*  PLT 153 166   BMET:  Recent Labs  02/10/13 0430 02/11/13 0450  NA 142 138  K 3.9 3.8  CL 106 99  CO2 29 27  GLUCOSE 91 98  BUN 4* 5*  CREATININE 0.53 0.63  CALCIUM 8.8 9.0    PT/INR: No results found for this basename: LABPROT, INR,  in the last 72 hours ABG    Component Value Date/Time   PHART 7.421 02/09/2013 0403   HCO3 27.4* 02/09/2013 0403   TCO2 29 02/09/2013 0403   O2SAT 98.0 02/09/2013 0403   CBG (last 3)  No results found for this basename: GLUCAP,  in the last 72 hours  Assessment/Plan: S/P Procedure(s) (LRB): VIDEO BRONCHOSCOPY (N/A) VIDEO ASSISTED THORACOSCOPY (VATS)/WEDGE RESECTION (Right)  1. CV- Hypertensive, SBP in 150s, will restart home Cozaar 2. Pulm- small right pleural effusion/atelectasis- will give dose of IV Lasix, patient with good use of IS- not on oxygen 3. Pathology-  Invasive Adenocarcinoma, Necrotizing granulomas 4. Dispo- patient doing well, will d/c pain ball- if patient remains stable likely home in AM   LOS: 3 days    BARRETT, ERIN 02/11/2013  Progressing well, seen by ID yesterday concerning granulomas Poss home 1-2 days I have seen and examined Tracy Dodson and agree with the above assessment  and plan.  Delight Ovens MD Beeper [phone] Office [phone] 02/11/2013 8:30 AM

## 2013-02-11 NOTE — Progress Notes (Signed)
On-Q d/c at this time; dressing applied to site; slight oozing noted from site; pt tolerated well; no c/o pain at this time; pt sitting on side of bed eating breakfast; will cont. To monitor.

## 2013-02-11 NOTE — Discharge Summary (Signed)
Physician Discharge Summary  Patient ID: Tracy Dodson MRN: [id number] DOB/AGE: 1951/04/18 62 y.o.  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 Ninetta Lights with infectious disease  Procedure (s):  Right video-assisted thoracoscopy and thoracotomy,  right middle lobectomy, wedge resection of superior segment of right lower lobe, lymph node sampling, placement of On-Q device, and bronchoscopy by Dr. Tyrone Sage on 02/08/2013.   Pathology: 1. Lung, resection (segmental or lobe), Right middle - INVASIVE ADENOCARCINOMA (1.5 CM). SEE COMMENT. - LYMPHOVASCULAR INVASION IDENTIFIED. - BRONCHO-VASCULAR MARGIN NEGATIVE FOR TUMOR - PARENCHYMAL MARGIN, NEGATIVE FOR TUMOR. - VISCERAL PLEURAL INVASION IDENTIFIED. - SEE TUMOR SYNOPTIC TEMPLATE BELOW. 2. Lung, wedge biopsy/resection, Right superior lower lobe - BENIGN LUNG WITH NECROTIZING GRANULOMATA, SEE COMMENT. - NEGATIVE FOR MALIGNANCY. - SURGICAL MARGIN, NEGATIVE FOR MALIGNANCY. 3. Lymph node, biopsy, 10R Right - ONE LYMPH NODE, NEGATIVE FOR TUMOR (0/1) 4. Lymph node, biopsy - ONE LYMPH NODE, NEGATIVE FOR TUMOR (0/1) 5. Lymph node, biopsy, Right 8 - ONE LYMPH NODE, NEGATIVE FOR TUMOR (0/1)  TNM Code: pT2,pN0,pMX  History of Presenting Illness: This is a 62 year old Caucasian  female diagnosed with Stage IIIa lung cancer, right upper lobe lung nodule with mediastinal adenopathy. This diagnosis was made by cytology of aspiration of 4R node. Neoadjuvant concurrent chemoradiation with chemotherapy in the form of weekly carboplatin  for an AUC of 2 and paclitaxel 45 mg per meter squared was given. Her  last dose was given 11/20/2012, with partial response. Her radiation treatments were completed March 3. She was referred for consideration of surgical resection. She has tolerated treatment well with exception of mild esophagitis. She is no longer smoking. She has remained functional and has not limited by shortness of breath. She notes that joint discomfort in her knees and hips is more limiting than shortness of breath. Potential risks, benefits, and complications of the surgery were discussed with the patient and she agreed to proceed. She underwent a bronchoscopy, RML, wedge of superior segment of the RLL, and lymph node sampling by Dr. Tyrone Sage on 02/08/2013.   Brief Hospital Course:  She has remained afebrile and hemodynamically stable. Her a line and foley were removed early in her post operative course. Her chest tube did not have an air leak. It was placed to water seal. Chest x rays remained stable.Her central line, foley, and chest tube were removed on 5/14. She was felt surgically stable for transfer from the ICU to [facility] for further convalescence on post operative day 2. She has been tolerating a diet. She is ambulating on room air. An infectious disease consult was obtained as pathology showed RML cancer as well as necrotizing granuloma of the superior segment of the RLL. Per Dr. Ninetta Lights, possible TB but her history is negative and she does not have any signs or symptoms. Blood cultures and AFB results pending. Provided she remains afebrile, hemodynamically stable, chest x ray remains stable, and pending morning round evaluation, she will be surgically stable for discharge on 5/16/20143  Latest Vital Signs: Blood pressure 151/67, pulse 91, temperature 98.5 F (36.9 C), temperature source Oral, resp. rate 18, height 5' 4.5" (1.638 m), weight 74 kg (163  lb 2.3 oz), SpO2 94.00%.  Physical Exam: General appearance: alert,  cooperative and no distress  Heart: regular rate and rhythm  Lungs: diminished breath sounds RLL  Abdomen: soft, non-tender; bowel sounds normal; no masses, no organomegaly  Wound: clean and dry   Discharge Condition:Stable  Recent laboratory studies:  Lab Results  Component Value Date   WBC 6.9 02/11/2013   HGB 9.1* 02/11/2013   HCT 30.1* 02/11/2013   MCV 84.6 02/11/2013   PLT 166 02/11/2013   Lab Results  Component Value Date   NA 138 02/11/2013   K 3.8 02/11/2013   CL 99 02/11/2013   CO2 27 02/11/2013   CREATININE 0.63 02/11/2013   GLUCOSE 98 02/11/2013      Diagnostic Studies: Dg Chest 2 View  02/11/2013   *RADIOLOGY REPORT*  Clinical Data: Removal of chest tube.  CHEST - 2 VIEW  Comparison: 05/14 and 02/08/2013  Findings: Right-sided chest tube has been removed.  No pneumothorax.  Tiny residual right effusion.  Slight new atelectasis adjacent to the superior aspect of the right hilum.  Left lung is clear.  Heart size and vascularity are normal.  IMPRESSION:  1.  No pneumothorax after chest tube removal. 2.  Slight new atelectasis at the superior aspect the right hilum in the region of the surgical staples. 3.  Tiny right effusion.   Original Report Authenticated By: Francene Boyers, M.D.   Nm Pet Image Restag (ps) Skull Base To Thigh  01/22/2013   *RADIOLOGY REPORT*  Clinical Data: Subsequent treatment strategy for lung cancer.  NUCLEAR MEDICINE PET SKULL BASE TO THIGH  Fasting Blood Glucose:  108  Technique:  17.0 mCi F-18 FDG was injected intravenously. CT data was obtained and used for attenuation correction and anatomic localization only.  (This was not acquired as a diagnostic CT examination.) Additional exam technical data entered on technologist worksheet.  Comparison:  CT chest dated 12/28/2012.  PET CT dated 09/24/2012.  Findings:  Neck: No hypermetabolic lymph nodes in the neck.  Chest:  1.2 x 1.3 cm right middle lobe nodule (series 2/image 91), partially cavitary with max SUV 4.9,  previously 1.9 x 1.4 cm with max SUV 10.9.  1.3 x 1.5 cm patchy/nodular ground-glass opacity in the posterior right lower lobe (series 2/image 76), with possible mild associated hypermetabolism, max SUV 2.6.  Infectious/inflammatory etiologies remain possible.  Underlying emphysematous changes.  No suspicious hypermetabolic thoracic lymphadenopathy.  Abdomen/Pelvis:  No abnormal hypermetabolic activity within the liver, pancreas, or spleen.  2.8 x 2.0 cm left adrenal adenoma, unchanged, with stable mild hypermetabolism (max SUV 5.1).  No hypermetabolic lymph nodes in the abdomen or pelvis.  Small hiatal hernia.  Cholelithiasis, without associated inflammatory changes.  Bladder is mildly thick-walled.  Small fat- containing left inguinal hernia.  Skeleton:  No focal hypermetabolic activity to suggest skeletal metastasis.  IMPRESSION: 1.3 cm right middle lobe nodule with max SUV 4.9, corresponding to known primary bronchogenic neoplasm, improved.  Prior hypermetabolic thoracic lymphadenopathy has resolved.  1.5 cm patchy/nodular ground-glass opacity in the posterior right lower lobe, max SUV 2.7, possibly infectious/inflammatory. Attention on follow-up is suggested.  Stable left adrenal adenoma.   Original Report Authenticated By: Charline Bills, M.D.     Future Appointments Provider Department Dept Phone   03/18/2013 9:00 AM Billie Lade, MD [facility] [phone]      Discharge Medications:    Medication List    TAKE these medications       b  complex vitamins tablet  Take 1 tablet by mouth daily.     BIOTIN 5000 PO  Take 10,000 mcg by mouth daily.     CALCIUM 600+D 600-200 MG-UNIT Tabs  Generic drug:  Calcium Carbonate-Vitamin D  Take 1 tablet by mouth daily.     citalopram 20 MG tablet  Commonly known as:  CELEXA  Take 30 mg by mouth daily.     doxylamine (Sleep) 25 MG tablet  Commonly known as:  UNISOM  Take 25 mg by mouth at bedtime as needed  for sleep.     Fish Oil 1000 MG Caps  Take 1 capsule by mouth daily.     glucosamine-chondroitin 500-400 MG tablet  Take 1 tablet by mouth daily.     losartan-hydrochlorothiazide 100-25 MG per tablet  Commonly known as:  HYZAAR  Take 1 tablet by mouth daily.     Melatonin 3 MG Tabs  Take 3 mg by mouth at bedtime as needed (sleep).     Milk Thistle 1000 MG Caps  Take 1 capsule by mouth daily.     oxyCODONE-acetaminophen 5-325 MG per tablet  Commonly known as:  PERCOCET/ROXICET  Take 1-2 tablets by mouth every 4 (four) hours as needed.     PredniSONE 1 MG Tbec  Take 5 mg by mouth every morning.     simvastatin 40 MG tablet  Commonly known as:  ZOCOR  Take 40 mg by mouth every morning.     traMADol 50 MG tablet  Commonly known as:  ULTRAM  Take 1-2 tablets (50-100 mg total) by mouth every 6 (six) hours as needed.     TYLENOL ARTHRITIS PAIN 650 MG CR tablet  Generic drug:  acetaminophen  Take 1,300 mg by mouth 3 (three) times daily.          Follow Up Appointments:     Follow-up Information   Follow up with GERHARDT,EDWARD B, MD. (PA/LAT cxr to be taken (at [facility] which is in the same building as Dr. Dennie Maizes office) one hour prior to office appointment;Office will mail appointment date and time)    Contact information:   [address] [place] [address] [phone]       Signed: [name] MPA-C 02/11/2013, 8:43 AM

## 2013-02-11 NOTE — Progress Notes (Signed)
Pt up ambulating in hallway with NT at this time; steady gait noted; no c/o pain; will cont. To monitor.

## 2013-02-11 NOTE — Progress Notes (Signed)
Pt ambulated 350 feet with RN; no walker needed; steady gait; pt back to chair in room; call bell w/i reach; will cont. To monitor.

## 2013-02-12 ENCOUNTER — Inpatient Hospital Stay (HOSPITAL_COMMUNITY): Payer: PRIVATE HEALTH INSURANCE

## 2013-02-12 MED ORDER — OXYCODONE-ACETAMINOPHEN 5-325 MG PO TABS: 1.0000 | ORAL_TABLET | ORAL | Status: DC | PRN

## 2013-02-12 MED ORDER — TRAMADOL HCL 50 MG PO TABS: 50.0000 mg | ORAL_TABLET | Freq: Four times a day (QID) | ORAL | Status: DC | PRN

## 2013-02-12 NOTE — Progress Notes (Signed)
4 Days Post-Op Procedure(s) (LRB): VIDEO BRONCHOSCOPY (N/A) VIDEO ASSISTED THORACOSCOPY (VATS)/WEDGE RESECTION (Right) Subjective:  Tracy Dodson is without complaints this morning.  She states she feels good and is hopeful to be discharged today  Objective: Vital signs in last 24 hours: Temp:  [98 F (36.7 C)-98.3 F (36.8 C)] 98.1 F (36.7 C) (05/16 0448) Pulse Rate:  [91-92] 92 (05/16 0448) Cardiac Rhythm:  [-] Normal sinus rhythm (05/16 0717) Resp:  [18] 18 (05/16 0448) BP: (101-127)/(53-73) 127/73 mmHg (05/16 0448) SpO2:  [96 %-98 %] 98 % (05/16 0448)  Intake/Output from previous day: 05/15 0701 - 05/16 0700 In: 600 [P.O.:600] Out: 2600 [Urine:2600] Intake/Output this shift: Total I/O In: 120 [P.O.:120] Out: -   General appearance: alert, cooperative and no distress Neurologic: intact Heart: regular rate and rhythm Lungs: diminished breath sounds RLL Abdomen: soft, non-tender; bowel sounds normal; no masses,  no organomegaly Wound: clean and dry  Lab Results:  Recent Labs  02/10/13 0430 02/11/13 0450  WBC 6.8 6.9  HGB 8.4* 9.1*  HCT 27.1* 30.1*  PLT 153 166   BMET:  Recent Labs  02/10/13 0430 02/11/13 0450  NA 142 138  K 3.9 3.8  CL 106 99  CO2 29 27  GLUCOSE 91 98  BUN 4* 5*  CREATININE 0.53 0.63  CALCIUM 8.8 9.0    PT/INR: No results found for this basename: LABPROT, INR,  in the last 72 hours ABG    Component Value Date/Time   PHART 7.421 02/09/2013 0403   HCO3 27.4* 02/09/2013 0403   TCO2 29 02/09/2013 0403   O2SAT 98.0 02/09/2013 0403   CBG (last 3)  No results found for this basename: GLUCAP,  in the last 72 hours  Assessment/Plan: S/P Procedure(s) (LRB): VIDEO BRONCHOSCOPY (N/A) VIDEO ASSISTED THORACOSCOPY (VATS)/WEDGE RESECTION (Right)  1. CV- SR, Hypertension improved 2. Pulm- slight increase in Atelecatasis/Pleural fluid on Right, patient remains off oxygen with sats of 98%, good use of IS 3. Necrotizing Granulomas- ID following,  will see on outpatient basis for remaining lab results that have not been resulted 4. Dispo- patient is doing well, slight increase in pleural fluid, likely d/c home today on Lasix, will discuss with staff   LOS: 4 days    Lowella Dandy 02/12/2013

## 2013-02-12 NOTE — Progress Notes (Signed)
Pt to d/c home with husband; IV and tele monitor removed at this time; chest tube sutures X2 removed; steri strips applied to sites; pt education given; pt verbalized understanding; will await husband to arrive.

## 2013-02-16 LAB — QUANTIFERON TB GOLD ASSAY (BLOOD): Quantiferon Nil Value: 0.06 IU/mL

## 2013-02-16 LAB — ANCA SCREEN W REFLEX TITER
Atypical p-ANCA Screen: POSITIVE — AB
c-ANCA Screen: NEGATIVE

## 2013-02-16 LAB — FUNGAL ANTIBODIES PANEL, ID-BLOOD
Aspergillus Flavus Antibodies: NEGATIVE
Aspergillus Niger Antibodies: NEGATIVE
Blastomyces Abs, Qn, DID: NEGATIVE
Coccidioides Antibody ID: NEGATIVE

## 2013-02-16 LAB — ANGIOTENSIN CONVERTING ENZYME: Angiotensin-Converting Enzyme: 39 U/L (ref 8–52)

## 2013-02-16 LAB — RHEUMATOID FACTOR: Rhuematoid fact SerPl-aCnc: 13 IU/mL (ref <=14)

## 2013-02-17 LAB — CULTURE, BLOOD (ROUTINE X 2): Culture: NO GROWTH

## 2013-02-24 ENCOUNTER — Encounter: Payer: Self-pay | Admitting: Cardiothoracic Surgery

## 2013-02-25 ENCOUNTER — Telehealth: Payer: Self-pay | Admitting: Medical Oncology

## 2013-02-25 ENCOUNTER — Other Ambulatory Visit: Payer: Self-pay | Admitting: *Deleted

## 2013-02-25 DIAGNOSIS — G8918 Other acute postprocedural pain: Secondary | ICD-10-CM

## 2013-02-25 MED ORDER — TRAMADOL HCL 50 MG PO TABS: 50.0000 mg | ORAL_TABLET | Freq: Four times a day (QID) | ORAL | Status: DC | PRN

## 2013-02-25 NOTE — Telephone Encounter (Signed)
Asking about follow up with [facility] . She sees her surgeon on Monday , I told her that her surgeon will likely make referral back to [facility] .

## 2013-02-26 ENCOUNTER — Other Ambulatory Visit: Payer: Self-pay | Admitting: *Deleted

## 2013-03-01 ENCOUNTER — Ambulatory Visit (INDEPENDENT_AMBULATORY_CARE_PROVIDER_SITE_OTHER): Payer: Self-pay | Admitting: Physician Assistant

## 2013-03-01 ENCOUNTER — Ambulatory Visit
Admission: RE | Admit: 2013-03-01 | Discharge: 2013-03-01 | Disposition: A | Discharge status: Home / Self Care | Payer: PRIVATE HEALTH INSURANCE | Source: Ambulatory Visit | Attending: Cardiothoracic Surgery | Admitting: Cardiothoracic Surgery

## 2013-03-01 DIAGNOSIS — Z09 Encounter for follow-up examination after completed treatment for conditions other than malignant neoplasm: Secondary | ICD-10-CM

## 2013-03-01 DIAGNOSIS — C3490 Malignant neoplasm of unspecified part of unspecified bronchus or lung: Secondary | ICD-10-CM

## 2013-03-01 DIAGNOSIS — Z9889 Other specified postprocedural states: Secondary | ICD-10-CM

## 2013-03-01 DIAGNOSIS — Z902 Acquired absence of lung [part of]: Secondary | ICD-10-CM
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
## 2013-03-05 ENCOUNTER — Telehealth: Payer: Self-pay | Admitting: Internal Medicine

## 2013-03-05 NOTE — Telephone Encounter (Signed)
returned pt call and per Dr. Arbutus Ped emailed ok to sched pt.Tracy KitchenMarland KitchenMarland KitchenDone...pt ok and aware

## 2013-03-08 ENCOUNTER — Ambulatory Visit (INDEPENDENT_AMBULATORY_CARE_PROVIDER_SITE_OTHER): Payer: PRIVATE HEALTH INSURANCE | Admitting: Infectious Diseases

## 2013-03-08 ENCOUNTER — Encounter: Payer: Self-pay | Admitting: Infectious Diseases

## 2013-03-08 VITALS — BP 126/79 | HR 80 | Temp 98.5°F | Ht 64.5 in | Wt 159.0 lb

## 2013-03-08 DIAGNOSIS — J8410 Pulmonary fibrosis, unspecified: Secondary | ICD-10-CM

## 2013-03-08 NOTE — Assessment & Plan Note (Signed)
All of her infection serologies were (-). She has no sx that would suggest an infection. I would suggest that her granuloma was from her CA. She has a atypical p-anca that is +, I am not usr eo fhte significance of this. Will ask her to f/u with her previous rheumatologist. She can f/u in ID prn.

## 2013-03-08 NOTE — Progress Notes (Signed)
  Subjective:    Patient ID: Tracy Dodson, female    DOB: [birth year], 62 y.o.   MRN: [id number]  HPI 62 yo F with hx of Stage IIIa RUL non-small cell cancer dx (09-29-12). She has been treated with CTX (last dose 11-20-12) and XRT (completed 11-30-12).  She was admitted on 5-12 and underwent VATS RLL wedge resection and RML lobectomy. Her path showed lympho-vascular and visceral pleural invasive adeno-cancer however it also showed necrotizing granulomas.   Feeling well, energy returning gradually. Wound well healed.  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 Also has hx of bursitis in L shoulder. Also prev dx with polymyalgia. Weaning off prednisone (down to 63m).  ROS- no f/c, no LAN, normal appetite, no change in wt.   F/u appt with Dr Shirline Frees and Tyrone Sage 04-01-13  Review of Systems     Objective:   Physical Exam  Constitutional: She appears well-developed and well-nourished.  HENT:  Mouth/Throat: No oropharyngeal exudate.  Eyes: EOM are normal. Pupils are equal, round, and reactive to light.  Neck: Neck supple.  Cardiovascular: Normal rate, regular rhythm and normal heart sounds.   Pulmonary/Chest: Effort normal and breath sounds normal.  Abdominal: Soft. Bowel sounds are normal. There is no tenderness. There is no rebound.  Musculoskeletal: She exhibits no edema.  Lymphadenopathy:    She has no cervical adenopathy.          Assessment & Plan:

## 2013-03-09 ENCOUNTER — Other Ambulatory Visit: Payer: Self-pay

## 2013-03-09 DIAGNOSIS — G8918 Other acute postprocedural pain: Secondary | ICD-10-CM

## 2013-03-09 MED ORDER — TRAMADOL HCL 50 MG PO TABS: 50.0000 mg | ORAL_TABLET | Freq: Three times a day (TID) | ORAL | Status: DC | PRN

## 2013-03-09 NOTE — Telephone Encounter (Signed)
RX refill for Tramadol called to pharm

## 2013-03-18 ENCOUNTER — Encounter: Payer: Self-pay | Admitting: Radiation Oncology

## 2013-03-18 ENCOUNTER — Telehealth: Payer: Self-pay | Admitting: Internal Medicine

## 2013-03-18 ENCOUNTER — Ambulatory Visit
Admission: RE | Admit: 2013-03-18 | Discharge: 2013-03-18 | Disposition: A | Discharge status: Home / Self Care | Payer: PRIVATE HEALTH INSURANCE | Source: Ambulatory Visit | Attending: Radiation Oncology | Admitting: Radiation Oncology

## 2013-03-18 NOTE — Progress Notes (Signed)
Radiation Oncology         [phone] ________________________________  Name: Tracy Dodson MRN: [id number]  Date: 03/18/2013  DOB: 1951-09-11  Follow-Up Visit Note  CC: [name], MD  Leslye Peer, MD  Diagnosis:  Stage IIIa non-small cell lung cancer  Interval Since Last Radiation:  4 months  Narrative:  The patient returns today for routine follow-up.  She is doing well at this time. She has started back to work part-time. She has minimal discomfort along the right chest wall area from her thoracotomy scar. She denies any cough or breathing problems. She denies any hemoptysis. She denies any new bony pain headaches dizziness or blurred vision.                              ALLERGIES:  has No Known Allergies.  Meds: Current Outpatient Prescriptions  Medication Sig Dispense Refill  . acetaminophen (TYLENOL ARTHRITIS PAIN) 650 MG CR tablet Take 1,300 mg by mouth 3 (three) times daily.      [name] b complex vitamins tablet Take 1 tablet by mouth daily.      [name] BIOTIN 5000 PO Take 10,000 mcg by mouth daily.       . Calcium Carbonate-Vitamin D (CALCIUM 600+D) 600-200 MG-UNIT TABS Take 1 tablet by mouth daily.      . citalopram (CELEXA) 20 MG tablet Take 20 mg by mouth daily.       [name] doxylamine, Sleep, (UNISOM) 25 MG tablet Take 25 mg by mouth at bedtime as needed for sleep.      [name] glucosamine-chondroitin 500-400 MG tablet Take 1 tablet by mouth daily.      [name] losartan-hydrochlorothiazide (HYZAAR) 100-25 MG per tablet Take 1 tablet by mouth daily.      . Melatonin 3 MG TABS Take 3 mg by mouth at bedtime as needed (sleep).      . Milk Thistle 1000 MG CAPS Take 1 capsule by mouth daily.      . Omega-3 Fatty Acids (FISH OIL) 1000 MG CAPS Take 1 capsule by mouth daily.      . PredniSONE 1 MG TBEC Take 4 mg by mouth every morning.       . simvastatin (ZOCOR) 40 MG tablet Take 40 mg by mouth every morning.       . traMADol (ULTRAM) 50 MG tablet Take 1-2 tablets (50-100 mg total) by mouth  every 8 (eight) hours as needed.  40 tablet  0  . oxyCODONE-acetaminophen (PERCOCET/ROXICET) 5-325 MG per tablet Take 1-2 tablets by mouth every 4 (four) hours as needed.  30 tablet  0   No current facility-administered medications for this encounter.    Physical Findings: The patient is in no acute distress. Patient is alert and oriented.  weight is 154 lb 3.2 oz (69.945 kg). Her oral temperature is 98.5 F (36.9 C). Her blood pressure is 113/71 and her pulse is 96. Her respiration is 20 and oxygen saturation is 96%. .  No palpable supraclavicular or axillary adenopathy. The lungs are clear to auscultation. The heart has a regular rhythm and rate. Examination of the right chest area reveals a well-healed the thoracotomy scar without signs of drainage or infection.  Lab Findings: Lab Results  Component Value Date   WBC 6.9 02/11/2013   HGB 9.1* 02/11/2013   HCT 30.1* 02/11/2013   MCV 84.6 02/11/2013   PLT 166 02/11/2013      Radiographic Findings:  Dg Chest 2 View  03/01/2013   *RADIOLOGY REPORT*  Clinical Data: Post right middle lobectomy as well as wedge resection of the superior segment of the right lower lobe for carcinoma.  Status post radiation and chemotherapy treatment as well.  CHEST - 2 VIEW  Comparison: Chest x-ray of 02/12/2013  Findings: The small right pleural effusion has cleared.  However, there is more opacity within the anterior right mid lung on the lateral view in the medial right lung base.  Although this could represent atelectasis and/or scarring, other considerations would be that of radiation fibrosis, or less likely recurrence of carcinoma.  The left lung is clear.  The lungs remain somewhat hyperaerated.  Heart size is stable.  No bony abnormality is noted.  IMPRESSION:  More opacity anteriorly on the lateral view and in the right lung base may be postoperative in nature, but atelectasis, pneumonia, radiation fibrosis, or less likely recurrence of carcinoma would be  considerations.   Original Report Authenticated By: Dwyane Dee, M.D.    Impression:  Patient is doing well status post preop radiation and radiosensitizing chemotherapy. She underwent a successful right lower lobe wedge resection and right middle lobe lobectomy.  Plan:  When necessary followup in radiation oncology. The patient will continue close followup in surgery and medical oncology.  _____________________________________  -----------------------------------  Billie Lade, PhD, MD

## 2013-03-18 NOTE — Progress Notes (Signed)
Follow up s/p rad txs right lung 10/20/12-11/27/12, no coughing, nausea or sob, did have VATS 02/08/13, p/o check 03/01/13 with Gerhardt, soreness stated when she takes deep breath, appetite good, no other c/o, sees Medical Oncologis Dr. Arbutus Ped 04/01/13

## 2013-03-18 NOTE — Telephone Encounter (Signed)
pt wanted to r/s appt due to having another appt on the same day.Marland KitchenMarland KitchenMarland KitchenDone

## 2013-03-27 LAB — AFB CULTURE, BLOOD

## 2013-03-29 ENCOUNTER — Ambulatory Visit (HOSPITAL_BASED_OUTPATIENT_CLINIC_OR_DEPARTMENT_OTHER): Payer: PRIVATE HEALTH INSURANCE | Admitting: Internal Medicine

## 2013-03-29 ENCOUNTER — Other Ambulatory Visit: Payer: Self-pay | Admitting: *Deleted

## 2013-03-29 ENCOUNTER — Telehealth: Payer: Self-pay | Admitting: Internal Medicine

## 2013-03-29 ENCOUNTER — Encounter: Payer: Self-pay | Admitting: Internal Medicine

## 2013-03-29 DIAGNOSIS — C3490 Malignant neoplasm of unspecified part of unspecified bronchus or lung: Secondary | ICD-10-CM

## 2013-03-29 DIAGNOSIS — G8918 Other acute postprocedural pain: Secondary | ICD-10-CM

## 2013-03-29 MED ORDER — TRAMADOL HCL 50 MG PO TABS: 50.0000 mg | ORAL_TABLET | Freq: Three times a day (TID) | ORAL | Status: DC | PRN

## 2013-03-29 NOTE — Telephone Encounter (Signed)
Gave pt appt for lab and MD on September 2014 °

## 2013-03-29 NOTE — Patient Instructions (Signed)
We discussed adjuvant treatment options. You will continue on observation for now. Followup visit in 3 months with repeat CT scan of the chest.
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
## 2013-04-01 ENCOUNTER — Encounter: Payer: Self-pay | Admitting: Cardiothoracic Surgery

## 2013-04-01 ENCOUNTER — Ambulatory Visit (INDEPENDENT_AMBULATORY_CARE_PROVIDER_SITE_OTHER): Payer: Self-pay | Admitting: Cardiothoracic Surgery

## 2013-04-01 ENCOUNTER — Ambulatory Visit: Payer: PRIVATE HEALTH INSURANCE | Admitting: Internal Medicine

## 2013-04-01 ENCOUNTER — Ambulatory Visit
Admission: RE | Admit: 2013-04-01 | Discharge: 2013-04-01 | Disposition: A | Discharge status: Home / Self Care | Payer: PRIVATE HEALTH INSURANCE | Source: Ambulatory Visit | Attending: Cardiothoracic Surgery | Admitting: Cardiothoracic Surgery

## 2013-04-01 DIAGNOSIS — C3430 Malignant neoplasm of lower lobe, unspecified bronchus or lung: Secondary | ICD-10-CM

## 2013-04-01 DIAGNOSIS — Z902 Acquired absence of lung [part of]: Secondary | ICD-10-CM

## 2013-04-01 DIAGNOSIS — Z9889 Other specified postprocedural states: Secondary | ICD-10-CM

## 2013-04-01 NOTE — Progress Notes (Signed)
[address]       [place] [address]             [phone]                  Tracy Dodson [facility] Medical Record #[id number] Date of Birth: 26-Dec-1950  Si Gaul, MD [name], MD  Chief Complaint:   PostOp Follow Up Visit DATE OF PROCEDURE: 02/08/2013  OPERATIVE REPORT  PREOPERATIVE DIAGNOSIS: Carcinoma of the right middle lobe, status post  radiation and chemotherapy treatment.  POSTOPERATIVE DIAGNOSIS: Carcinoma of the right middle lobe, status  post radiation and chemotherapy treatment.  SURGICAL PROCEDURE: Right video-assisted thoracoscopy and thoracotomy,  right middle lobectomy, wedge resection of superior segment of right  lower lobe, lymph node sampling, placement of On-Q device, and  bronchoscopy,  PATH:  Stage IB  ( ypT2a, ypN0, cM0) EGFR Exon19 mutation detected INVASIVE ADENOCARCINOMA (1.5 CM). 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   She underwent a course of Neoadjuvant concurrent chemoradiation with chemotherapy in the form of weekly carboplatin for an AUC of 2 and paclitaxel 45 mg per meter squared, last dose was given 11/20/2012 with partial response.  On 02/08/2013 patient underwent surgical resection for invasive adenocarcinoma after treatment staged 1B. She also was noted in the specimen did have necrotizing granuloma. She saw infectious disease for that, no further treatment was warranted.  Patient's doing well postoperatively. She is now return to work 5-6 hours a day. She is able to climb stairs in her house without difficulty.     History  Smoking status  . Former Smoker -- 0.30 packs/day for 36 years  . Types: Cigarettes  . Quit date: 09/23/2012  Smokeless  tobacco  . Never Used       No Known Allergies  Current Outpatient Prescriptions  Medication Sig Dispense Refill  . acetaminophen (TYLENOL ARTHRITIS PAIN) 650 MG CR tablet Take 1,300 mg by mouth 3 (three) times daily.      [name] Dodson complex vitamins tablet Take 1 tablet by mouth daily.      [name] BIOTIN 5000 PO Take 10,000 mcg by mouth daily.       . Calcium Carbonate-Vitamin D (CALCIUM 600+D) 600-200 MG-UNIT TABS Take 1 tablet by mouth daily.      . citalopram (CELEXA) 20 MG tablet Take 20 mg by mouth daily.       [name] doxylamine, Sleep, (UNISOM) 25 MG tablet Take 25 mg by mouth at bedtime as needed for sleep.      [name] glucosamine-chondroitin 500-400 MG tablet Take 1 tablet by mouth daily.      [name] losartan-hydrochlorothiazide (HYZAAR) 100-25 MG per tablet Take 1 tablet by mouth daily.      . Melatonin 3 MG TABS Take 3 mg by mouth at bedtime as needed (sleep).      . Milk Thistle 1000 MG CAPS Take 1 capsule by mouth daily.      . Omega-3 Fatty Acids (FISH OIL) 1000 MG CAPS Take 1 capsule by mouth daily.      . PredniSONE 1 MG TBEC Take 4 mg by mouth every morning.       . simvastatin (ZOCOR) 40 MG tablet Take 40 mg by  mouth every morning.       . traMADol (ULTRAM) 50 MG tablet Take 1-2 tablets (50-100 mg total) by mouth every 8 (eight) hours as needed.  40 tablet  0   No current facility-administered medications for this visit.       Physical Exam: BP 111/70  Pulse 76  Resp 16  Ht 5\' 6"  (1.676 m)  Wt 154 lb (69.854 kg)  BMI 24.87 kg/m2  SpO2 96%  General appearance: alert, cooperative and no distress Neurologic: intact Heart: regular rate and rhythm, S1, S2 normal, no murmur, click, rub or gallop Lungs: clear to auscultation bilaterally and normal percussion bilaterally Abdomen: soft, non-tender; bowel sounds normal; no masses,  no organomegaly Extremities: extremities normal, atraumatic, no cyanosis or edema and Homans sign is negative, no sign of DVT   Diagnostic Studies &  Laboratory data:         Recent Radiology Findings: Dg Chest 2 View  04/01/2013   *RADIOLOGY REPORT*  Clinical Data: History of right middle lobectomy and wedge resection of the superior segment right lower lobe for lung carcinoma status post radiation and chemotherapy, follow-up  CHEST - 2 VIEW  Comparison: Chest x-ray of 03/01/2013  Findings: Postoperative changes on the right are again noted from prior right middle lobectomy and wedge resection of the superior segment right upper lobe.  Aeration of the right lung base has improved.  There is a nodular opacity medially at the right lung base most likely postoperative in nature in view of the recent surgery but attention to this area on follow-up chest x-ray is recommended.  The left lung is clear.  Cardiomegaly is stable.  The bones are osteopenic and there is a slight thoracic kyphosis present.  IMPRESSION: Improved aeration of the right with postoperative atelectasis and/or scarring.  Nodular opacity medially at the right lung base most likely is postoperative in nature but recommend attention to this area on follow-up chest x-ray.   Original Report Authenticated By: Dwyane Dee, M.D.      Recent Labs: Lab Results  Component Value Date   WBC 6.9 02/11/2013   HGB 9.1* 02/11/2013   HCT 30.1* 02/11/2013   PLT 166 02/11/2013   GLUCOSE 98 02/11/2013   ALT 16 02/10/2013   AST 20 02/10/2013   NA 138 02/11/2013   K 3.8 02/11/2013   CL 99 02/11/2013   CREATININE 0.63 02/11/2013   BUN 5* 02/11/2013   CO2 27 02/11/2013   INR 0.98 02/04/2013      Assessment / Plan:      Patient making very satisfactory progress after surgical resection of adenocarcinoma the lung, with preoperative neoadjuvant chemoradiation. The surgical pathology showed favorable response. The patient scheduled for a followup CT scan in September and to see Dr. Arbutus Ped at that time. I'll plan to see the patient back in 5 months with a followup PA and lateral chest  x-ray.      Tracy Dodson 04/01/2013 12:48 PM

## 2013-04-05 ENCOUNTER — Ambulatory Visit: Payer: PRIVATE HEALTH INSURANCE | Admitting: Internal Medicine

## 2013-06-28 ENCOUNTER — Other Ambulatory Visit (HOSPITAL_BASED_OUTPATIENT_CLINIC_OR_DEPARTMENT_OTHER): Payer: PRIVATE HEALTH INSURANCE | Admitting: Lab

## 2013-06-28 ENCOUNTER — Ambulatory Visit (HOSPITAL_COMMUNITY)
Admission: RE | Admit: 2013-06-28 | Discharge: 2013-06-28 | Disposition: A | Discharge status: Home / Self Care | Payer: PRIVATE HEALTH INSURANCE | Source: Ambulatory Visit | Attending: Internal Medicine | Admitting: Internal Medicine

## 2013-06-28 DIAGNOSIS — C3490 Malignant neoplasm of unspecified part of unspecified bronchus or lung: Secondary | ICD-10-CM | POA: Insufficient documentation

## 2013-06-28 DIAGNOSIS — J90 Pleural effusion, not elsewhere classified: Secondary | ICD-10-CM | POA: Insufficient documentation

## 2013-06-28 DIAGNOSIS — M47814 Spondylosis without myelopathy or radiculopathy, thoracic region: Secondary | ICD-10-CM | POA: Insufficient documentation

## 2013-06-28 DIAGNOSIS — R911 Solitary pulmonary nodule: Secondary | ICD-10-CM | POA: Insufficient documentation

## 2013-06-28 DIAGNOSIS — K449 Diaphragmatic hernia without obstruction or gangrene: Secondary | ICD-10-CM | POA: Insufficient documentation

## 2013-06-28 DIAGNOSIS — K8020 Calculus of gallbladder without cholecystitis without obstruction: Secondary | ICD-10-CM | POA: Insufficient documentation

## 2013-06-28 LAB — CBC WITH DIFFERENTIAL/PLATELET
BASO%: 0.3 % (ref 0.0–2.0)
EOS%: 0.2 % (ref 0.0–7.0)
LYMPH%: 14.7 % (ref 14.0–49.7)
MCH: 27.3 pg (ref 25.1–34.0)
MCHC: 32.6 g/dL (ref 31.5–36.0)
MONO#: 1.2 10*3/uL — ABNORMAL HIGH (ref 0.1–0.9)
MONO%: 14.7 % — ABNORMAL HIGH (ref 0.0–14.0)
Platelets: 162 10*3/uL (ref 145–400)
RBC: 3.69 10*6/uL — ABNORMAL LOW (ref 3.70–5.45)
WBC: 8.2 10*3/uL (ref 3.9–10.3)

## 2013-06-28 LAB — COMPREHENSIVE METABOLIC PANEL (CC13)
ALT: 13 U/L (ref 0–55)
AST: 15 U/L (ref 5–34)
Alkaline Phosphatase: 67 U/L (ref 40–150)
CO2: 27 mEq/L (ref 22–29)
Sodium: 140 mEq/L (ref 136–145)
Total Bilirubin: 0.38 mg/dL (ref 0.20–1.20)
Total Protein: 6.6 g/dL (ref 6.4–8.3)

## 2013-06-28 MED ORDER — IOHEXOL 300 MG/ML  SOLN
80.0000 mL | Freq: Once | INTRAMUSCULAR | Status: AC | PRN
  Administered 2013-06-28: 80 mL via INTRAVENOUS

## 2013-06-29 ENCOUNTER — Telehealth: Payer: Self-pay | Admitting: Internal Medicine

## 2013-06-29 ENCOUNTER — Ambulatory Visit (HOSPITAL_BASED_OUTPATIENT_CLINIC_OR_DEPARTMENT_OTHER): Payer: PRIVATE HEALTH INSURANCE | Admitting: Internal Medicine

## 2013-06-29 ENCOUNTER — Encounter: Payer: Self-pay | Admitting: Internal Medicine

## 2013-06-29 DIAGNOSIS — C342 Malignant neoplasm of middle lobe, bronchus or lung: Secondary | ICD-10-CM
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
## 2013-06-29 NOTE — Telephone Encounter (Signed)
lvm for pt regarding to Dec appts.... °

## 2013-06-29 NOTE — Patient Instructions (Signed)
Followup visit in 3 months with repeat CT scan of the chest. 

## 2013-08-05 ENCOUNTER — Other Ambulatory Visit: Payer: Self-pay

## 2013-09-02 ENCOUNTER — Other Ambulatory Visit: Payer: Self-pay | Admitting: *Deleted

## 2013-09-02 ENCOUNTER — Ambulatory Visit (INDEPENDENT_AMBULATORY_CARE_PROVIDER_SITE_OTHER): Payer: PRIVATE HEALTH INSURANCE | Admitting: Cardiothoracic Surgery

## 2013-09-02 ENCOUNTER — Ambulatory Visit
Admission: RE | Admit: 2013-09-02 | Discharge: 2013-09-02 | Disposition: A | Discharge status: Home / Self Care | Payer: PRIVATE HEALTH INSURANCE | Source: Ambulatory Visit | Attending: Cardiothoracic Surgery | Admitting: Cardiothoracic Surgery

## 2013-09-02 ENCOUNTER — Encounter: Payer: Self-pay | Admitting: Cardiothoracic Surgery

## 2013-09-02 VITALS — BP 118/78 | HR 88 | Resp 20 | Ht 66.0 in | Wt 165.0 lb

## 2013-09-02 DIAGNOSIS — Z902 Acquired absence of lung [part of]: Secondary | ICD-10-CM

## 2013-09-02 DIAGNOSIS — C3490 Malignant neoplasm of unspecified part of unspecified bronchus or lung: Secondary | ICD-10-CM

## 2013-09-02 DIAGNOSIS — C3491 Malignant neoplasm of unspecified part of right bronchus or lung: Secondary | ICD-10-CM

## 2013-09-02 DIAGNOSIS — Z9889 Other specified postprocedural states: Secondary | ICD-10-CM

## 2013-09-02 NOTE — Progress Notes (Signed)
Tracy Dodson [facility] Medical Record #[id number] Date of Birth: 29-Jun-1951  Si Gaul, MD  Duane Lope, MD  Chief Complaint:   PostOp Follow Up Visit DATE OF PROCEDURE: 02/08/2013  PREOPERATIVE DIAGNOSIS: Carcinoma of the right middle lobe, status post  radiation and chemotherapy treatment.  POSTOPERATIVE DIAGNOSIS: Carcinoma of the right middle lobe, status  post radiation and chemotherapy treatment.  SURGICAL PROCEDURE: Right video-assisted thoracoscopy and thoracotomy,  right middle lobectomy, wedge resection of superior segment of right  lower lobe, lymph node sampling, placement of On-Q device, and  bronchoscopy,  PATH:  Stage IB  ( ypT2a, ypN0, cM0) EGFR Exon19 mutation detected INVASIVE ADENOCARCINOMA (1.5 CM). 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   She underwent a course of Neoadjuvant concurrent chemoradiation with chemotherapy in the form of weekly carboplatin for an AUC of 2 and paclitaxel 45 mg per meter squared, last dose was given 11/20/2012 with partial response.  On 02/08/2013 patient underwent surgical resection for invasive adenocarcinoma after treatment staged 1B. She also was noted in the specimen did have necrotizing granuloma. She saw infectious disease for that, no further treatment was warranted.  Patient's doing well postoperatively. She is now return to work 5-6 hours a day. He's had no cough no hemoptysis. She notes she's up-to-date on her flu vaccination      History  Smoking status  . Former Smoker -- 0.30 packs/day for 36 years  . Types: Cigarettes  . Quit date: 09/23/2012  Smokeless tobacco  . Never Used       No Known Allergies  Current Outpatient  Prescriptions  Medication Sig Dispense Refill  . acetaminophen (TYLENOL ARTHRITIS PAIN) 650 MG CR tablet Take 1,300 mg by mouth 3 (three) times daily.      [name] b complex vitamins tablet Take 1 tablet by mouth daily.      [name] BIOTIN 5000 PO Take 10,000 mcg by mouth daily.       . Calcium Carbonate-Vitamin D (CALCIUM 600+D) 600-200 MG-UNIT TABS Take 1 tablet by mouth daily.      . citalopram (CELEXA) 20 MG tablet Take 20 mg by mouth daily.       [name] doxylamine, Sleep, (UNISOM) 25 MG tablet Take 25 mg by mouth at bedtime as needed for sleep.      [name] glucosamine-chondroitin 500-400 MG tablet Take 1 tablet by mouth daily.      [name] losartan-hydrochlorothiazide (HYZAAR) 100-25 MG per tablet Take 1 tablet by mouth daily.      . Melatonin 3 MG TABS Take 3 mg by mouth at bedtime as needed (sleep).      . Milk Thistle 1000 MG CAPS Take 1 capsule by mouth daily.      . Omega-3 Fatty Acids (FISH OIL) 1000 MG CAPS Take 1 capsule by mouth daily.      . PredniSONE 1 MG TBEC Take 4 mg by mouth every morning.       . simvastatin (ZOCOR) 40 MG tablet Take 40 mg by mouth every morning.        No current facility-administered medications for this visit.       Physical  Exam: BP 118/78  Pulse 88  Resp 20  Ht 5\' 6"  (1.676 m)  Wt 165 lb (74.844 kg)  BMI 26.64 kg/m2  SpO2 96%  General appearance: alert, cooperative and no distress Neurologic: intact Heart: regular rate and rhythm, S1, S2 normal, no murmur, click, rub or gallop Lungs: clear to auscultation bilaterally and normal percussion bilaterally Abdomen: soft, non-tender; bowel sounds normal; no masses,  no organomegaly Extremities: extremities normal, atraumatic, no cyanosis or edema and Homans sign is negative, no sign of DVT   Diagnostic Studies & Laboratory data:         Recent Radiology Findings: Dg Chest 2 View  09/02/2013   CLINICAL DATA:  History of rectal lung carcinoma with surgery, radiation therapy and chemotherapy, followup  EXAM: CHEST  2  VIEW  COMPARISON:  CT chest of 06/28/2013 and chest x-ray of 04/01/2013  FINDINGS: Aeration of the right lung base has improved with linear scarring remaining medially at the right lung base. No infiltrate or effusion is seen. Mediastinal contours are stable and heart size is stable. The bones are osteopenic and there is a thoracic kyphosis present.  IMPRESSION: Improved aeration with postoperative linear opacity remaining at the right lung base.   Electronically Signed   By: Dwyane Dee M.D.   On: 09/02/2013 11:02    CLINICAL DATA: Evaluate lung cancer  EXAM:  CT CHEST WITH CONTRAST  TECHNIQUE:  Multidetector CT imaging of the chest was performed during  intravenous contrast administration.  CONTRAST: 80mL OMNIPAQUE IOHEXOL 300 MG/ML SOLN  COMPARISON: 12/28/2012  FINDINGS:  There is a small right pleural effusion which appears partially  loculated, image 32/series 2. This is new from previous exam.  Interval right middle lobectomy and wedge resection of superior  segment of right lower lobe. There is mild paramediastinal scarring/  fibrosis compatible with changes of external beam radiation. There  is a pulmonary nodule within the right upper lobe that measures 4  mm, image 16/ series 7. This is a new finding when compared with the  previous exam.  There are no enlarged mediastinal or hilar lymph nodes. The heart  size appears normal. No pericardial effusion. Small hiatal hernia is  noted. The esophagus is otherwise normal. No supraclavicular or  axillary adenopathy identified.  Limited imaging through the upper abdomen shows a 1.4 cm stone  within the gallbladder. Nodule in the left adrenal gland measures  2.7 cm, image 56/ series 2. This is stable from previous exam  Review of the visualized bony structures is significant for mild  thoracic spondylosis. No aggressive lytic or sclerotic bone lesions  identified.  IMPRESSION:  1. Status post right middle lobectomy and wedge resection  of the  superior segment of right lower lobe.  2. Interval development of radiation change within the right lung  3. New small nodule in the right upper lobe measures 4 mm. Attention  on follow-up imaging advise. 4. Stable left adrenal gland nodule.  Electronically Signed  By: Signa Kell M.D.     Recent Labs: Lab Results  Component Value Date   WBC 8.2 06/28/2013   HGB 10.1* 06/28/2013   HCT 30.9* 06/28/2013   PLT 162 06/28/2013   GLUCOSE 86 06/28/2013   ALT 13 06/28/2013   AST 15 06/28/2013   NA 140 06/28/2013   K 3.9 06/28/2013   CL 99 02/11/2013   CREATININE 0.7 06/28/2013   BUN 12.0 06/28/2013   CO2 27 06/28/2013   INR 0.98 02/04/2013  Assessment / Plan:   Doing well from a surgical standpoint following right middle lobectomy and wedge resection of right lower lobe New small nodule in the right upper lobe measures 4 mm- follow up ct scheduled already for 09/28/2013 will make sure this is a super d format in case needs ENB. Patient has a followup appointment in 3 months, this could be modified depending on the findings of the CT scan in late December.    Darwyn Ponzo B 09/02/2013 10:59 AM

## 2013-09-28 ENCOUNTER — Other Ambulatory Visit (HOSPITAL_BASED_OUTPATIENT_CLINIC_OR_DEPARTMENT_OTHER): Payer: PRIVATE HEALTH INSURANCE

## 2013-09-28 ENCOUNTER — Encounter (HOSPITAL_COMMUNITY): Payer: Self-pay

## 2013-09-28 ENCOUNTER — Ambulatory Visit (HOSPITAL_COMMUNITY)
Admission: RE | Admit: 2013-09-28 | Discharge: 2013-09-28 | Disposition: A | Discharge status: Home / Self Care | Payer: PRIVATE HEALTH INSURANCE | Source: Ambulatory Visit | Attending: Internal Medicine | Admitting: Internal Medicine

## 2013-09-28 DIAGNOSIS — C3490 Malignant neoplasm of unspecified part of unspecified bronchus or lung: Secondary | ICD-10-CM | POA: Insufficient documentation

## 2013-09-28 DIAGNOSIS — E278 Other specified disorders of adrenal gland: Secondary | ICD-10-CM | POA: Insufficient documentation

## 2013-09-28 DIAGNOSIS — K449 Diaphragmatic hernia without obstruction or gangrene: Secondary | ICD-10-CM | POA: Insufficient documentation

## 2013-09-28 DIAGNOSIS — R911 Solitary pulmonary nodule: Secondary | ICD-10-CM | POA: Insufficient documentation

## 2013-09-28 LAB — COMPREHENSIVE METABOLIC PANEL (CC13)
Alkaline Phosphatase: 83 U/L (ref 40–150)
Anion Gap: 12 mEq/L — ABNORMAL HIGH (ref 3–11)
BUN: 15.7 mg/dL (ref 7.0–26.0)
CO2: 27 mEq/L (ref 22–29)
Creatinine: 0.8 mg/dL (ref 0.6–1.1)
Glucose: 73 mg/dl (ref 70–140)
Total Bilirubin: 0.39 mg/dL (ref 0.20–1.20)

## 2013-09-28 LAB — CBC WITH DIFFERENTIAL/PLATELET
BASO%: 0.5 % (ref 0.0–2.0)
Eosinophils Absolute: 0 10*3/uL (ref 0.0–0.5)
HCT: 32.6 % — ABNORMAL LOW (ref 34.8–46.6)
LYMPH%: 7.7 % — ABNORMAL LOW (ref 14.0–49.7)
MONO#: 2.3 10*3/uL — ABNORMAL HIGH (ref 0.1–0.9)
NEUT#: 8.7 10*3/uL — ABNORMAL HIGH (ref 1.5–6.5)
NEUT%: 72.3 % (ref 38.4–76.8)
Platelets: 150 10*3/uL (ref 145–400)
WBC: 12 10*3/uL — ABNORMAL HIGH (ref 3.9–10.3)
lymph#: 0.9 10*3/uL (ref 0.9–3.3)

## 2013-09-28 MED ORDER — IOHEXOL 300 MG/ML  SOLN
80.0000 mL | Freq: Once | INTRAMUSCULAR | Status: AC | PRN
  Administered 2013-09-28: 80 mL via INTRAVENOUS

## 2013-09-29 ENCOUNTER — Telehealth: Payer: Self-pay | Admitting: Internal Medicine

## 2013-09-29 ENCOUNTER — Encounter: Payer: Self-pay | Admitting: Internal Medicine

## 2013-09-29 ENCOUNTER — Ambulatory Visit (HOSPITAL_BASED_OUTPATIENT_CLINIC_OR_DEPARTMENT_OTHER): Payer: PRIVATE HEALTH INSURANCE | Admitting: Internal Medicine

## 2013-09-29 DIAGNOSIS — C342 Malignant neoplasm of middle lobe, bronchus or lung: Secondary | ICD-10-CM
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
## 2013-09-29 NOTE — Telephone Encounter (Signed)
lvm for pt regarding to April 2015 appts °

## 2013-09-30 NOTE — Patient Instructions (Signed)
Follow up visit in 3 months with repeat CT scan of the chest.

## 2013-11-01 ENCOUNTER — Telehealth: Payer: Self-pay | Admitting: *Deleted

## 2013-11-01 NOTE — Telephone Encounter (Signed)
Pt called stating that she has had lower back pain x 3 weeks and she is worried that it is r/t to her adrenal gland lesion.  Per dr Vista Mink, pt can go to her PCP To be evaluated, according to the CT scan on 12/30, the adrenal gland lesion is stable, so the pain does not appear to be oncology related.  Pt verbalized understanding.  SLJ

## 2013-12-02 ENCOUNTER — Ambulatory Visit: Payer: PRIVATE HEALTH INSURANCE | Admitting: Cardiothoracic Surgery

## 2013-12-29 ENCOUNTER — Encounter (HOSPITAL_COMMUNITY): Payer: Self-pay

## 2013-12-29 ENCOUNTER — Other Ambulatory Visit (HOSPITAL_BASED_OUTPATIENT_CLINIC_OR_DEPARTMENT_OTHER): Payer: PRIVATE HEALTH INSURANCE

## 2013-12-29 ENCOUNTER — Ambulatory Visit (HOSPITAL_COMMUNITY)
Admission: RE | Admit: 2013-12-29 | Discharge: 2013-12-29 | Disposition: A | Discharge status: Home / Self Care | Payer: PRIVATE HEALTH INSURANCE | Source: Ambulatory Visit | Attending: Internal Medicine | Admitting: Internal Medicine

## 2013-12-29 DIAGNOSIS — E278 Other specified disorders of adrenal gland: Secondary | ICD-10-CM | POA: Insufficient documentation

## 2013-12-29 DIAGNOSIS — I2584 Coronary atherosclerosis due to calcified coronary lesion: Secondary | ICD-10-CM | POA: Insufficient documentation

## 2013-12-29 DIAGNOSIS — I700 Atherosclerosis of aorta: Secondary | ICD-10-CM | POA: Insufficient documentation

## 2013-12-29 DIAGNOSIS — I2510 Atherosclerotic heart disease of native coronary artery without angina pectoris: Secondary | ICD-10-CM | POA: Insufficient documentation

## 2013-12-29 DIAGNOSIS — Z85118 Personal history of other malignant neoplasm of bronchus and lung: Secondary | ICD-10-CM | POA: Insufficient documentation

## 2013-12-29 DIAGNOSIS — M47817 Spondylosis without myelopathy or radiculopathy, lumbosacral region: Secondary | ICD-10-CM | POA: Insufficient documentation

## 2013-12-29 DIAGNOSIS — K8020 Calculus of gallbladder without cholecystitis without obstruction: Secondary | ICD-10-CM | POA: Insufficient documentation

## 2013-12-29 DIAGNOSIS — R918 Other nonspecific abnormal finding of lung field: Secondary | ICD-10-CM | POA: Insufficient documentation

## 2013-12-29 DIAGNOSIS — Z9221 Personal history of antineoplastic chemotherapy: Secondary | ICD-10-CM | POA: Insufficient documentation

## 2013-12-29 DIAGNOSIS — Z902 Acquired absence of lung [part of]: Secondary | ICD-10-CM | POA: Insufficient documentation

## 2013-12-29 DIAGNOSIS — T66XXXA Radiation sickness, unspecified, initial encounter: Secondary | ICD-10-CM | POA: Insufficient documentation

## 2013-12-29 DIAGNOSIS — C3490 Malignant neoplasm of unspecified part of unspecified bronchus or lung: Secondary | ICD-10-CM

## 2013-12-29 DIAGNOSIS — K449 Diaphragmatic hernia without obstruction or gangrene: Secondary | ICD-10-CM | POA: Insufficient documentation

## 2013-12-29 LAB — COMPREHENSIVE METABOLIC PANEL (CC13)
ALT: 20 U/L (ref 0–55)
AST: 19 U/L (ref 5–34)
Albumin: 3.9 g/dL (ref 3.5–5.0)
Alkaline Phosphatase: 75 U/L (ref 40–150)
Anion Gap: 10 mEq/L (ref 3–11)
BUN: 13.9 mg/dL (ref 7.0–26.0)
CALCIUM: 9.2 mg/dL (ref 8.4–10.4)
CHLORIDE: 102 meq/L (ref 98–109)
CO2: 29 meq/L (ref 22–29)
Creatinine: 0.8 mg/dL (ref 0.6–1.1)
Glucose: 97 mg/dl (ref 70–140)
Potassium: 3.9 mEq/L (ref 3.5–5.1)
Sodium: 141 mEq/L (ref 136–145)
Total Bilirubin: 0.42 mg/dL (ref 0.20–1.20)
Total Protein: 6.8 g/dL (ref 6.4–8.3)

## 2013-12-29 LAB — CBC WITH DIFFERENTIAL/PLATELET
BASO%: 0.3 % (ref 0.0–2.0)
Basophils Absolute: 0 10*3/uL (ref 0.0–0.1)
EOS ABS: 0 10*3/uL (ref 0.0–0.5)
EOS%: 0.1 % (ref 0.0–7.0)
HCT: 33.6 % — ABNORMAL LOW (ref 34.8–46.6)
HGB: 10.8 g/dL — ABNORMAL LOW (ref 11.6–15.9)
LYMPH#: 0.8 10*3/uL — AB (ref 0.9–3.3)
LYMPH%: 9.2 % — ABNORMAL LOW (ref 14.0–49.7)
MCH: 27.9 pg (ref 25.1–34.0)
MCHC: 32.1 g/dL (ref 31.5–36.0)
MCV: 86.9 fL (ref 79.5–101.0)
MONO#: 1.7 10*3/uL — ABNORMAL HIGH (ref 0.1–0.9)
MONO%: 19.1 % — ABNORMAL HIGH (ref 0.0–14.0)
NEUT#: 6.4 10*3/uL (ref 1.5–6.5)
NEUT%: 71.3 % (ref 38.4–76.8)
Platelets: 143 10*3/uL — ABNORMAL LOW (ref 145–400)
RBC: 3.87 10*6/uL (ref 3.70–5.45)
RDW: 17.3 % — AB (ref 11.2–14.5)
WBC: 8.9 10*3/uL (ref 3.9–10.3)

## 2013-12-29 LAB — TECHNOLOGIST REVIEW

## 2013-12-29 MED ORDER — IOHEXOL 300 MG/ML  SOLN
80.0000 mL | Freq: Once | INTRAMUSCULAR | Status: AC | PRN
  Administered 2013-12-29: 80 mL via INTRAVENOUS

## 2013-12-30 ENCOUNTER — Ambulatory Visit (INDEPENDENT_AMBULATORY_CARE_PROVIDER_SITE_OTHER): Payer: PRIVATE HEALTH INSURANCE | Admitting: Cardiothoracic Surgery

## 2013-12-30 ENCOUNTER — Encounter: Payer: Self-pay | Admitting: Cardiothoracic Surgery

## 2013-12-30 VITALS — BP 129/90 | HR 89 | Resp 16 | Ht 66.0 in | Wt 167.0 lb

## 2013-12-30 DIAGNOSIS — R918 Other nonspecific abnormal finding of lung field: Secondary | ICD-10-CM

## 2013-12-30 DIAGNOSIS — Z902 Acquired absence of lung [part of]: Secondary | ICD-10-CM

## 2013-12-30 DIAGNOSIS — C342 Malignant neoplasm of middle lobe, bronchus or lung: Secondary | ICD-10-CM

## 2013-12-30 DIAGNOSIS — Z9889 Other specified postprocedural states: Secondary | ICD-10-CM

## 2013-12-30 NOTE — Progress Notes (Signed)
[facility] #[id number] Date of Birth: Nov 09, 1950  Curt Bears, MD  [name], MD  Chief Complaint:   PostOp Follow Up Visit DATE OF PROCEDURE: 02/08/2013  PREOPERATIVE DIAGNOSIS: Carcinoma of the right middle lobe, status post  radiation and chemotherapy treatment.  POSTOPERATIVE DIAGNOSIS: Carcinoma of the right middle lobe, status  post radiation and chemotherapy treatment.  SURGICAL PROCEDURE: Right video-assisted thoracoscopy and thoracotomy,  right middle lobectomy, wedge resection of superior segment of right  lower lobe, lymph node sampling, placement of On-Q device, and  bronchoscopy,  PATH:  Stage IB  ( ypT2a, ypN0, cM0) EGFR Exon19 mutation detected INVASIVE ADENOCARCINOMA (1.5 CM). 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   She underwent a course of Neoadjuvant concurrent chemoradiation with chemotherapy in the form of weekly carboplatin for an AUC of 2 and paclitaxel 45 mg per meter squared, last dose was given 11/20/2012 with partial response.  On 02/08/2013 patient underwent surgical resection for invasive adenocarcinoma after treatment staged 1B. She also was noted in the specimen did have necrotizing granuloma. She saw infectious disease for that, no further treatment was warranted.  Patient's doing well postoperatively. She is now return to work 5-6 hours a day. He's had no cough no hemoptysis. Overall the patient feels well    Zubrod Score: At the time of surgery this patient's most appropriate activity status/level should be described as: _0     0    Normal activity, no symptoms _1     1    Restricted in physical strenuous activity but ambulatory, able to  do out light work _2     2    Ambulatory and capable of self care, unable to do work activities, up and about >50 % of waking hours                              _3     3    Only limited self care, in bed greater than 50% of waking hours _4     4    Completely disabled, no self care, confined to bed or chair _5     5    Moribund  History  Smoking status  . Former Smoker -- 0.30 packs/day for 36 years  . Types: Cigarettes  . Quit date: 09/23/2012  Smokeless tobacco  . Never Used       No Known Allergies  Current Outpatient Prescriptions  Medication Sig Dispense Refill  . acetaminophen (TYLENOL ARTHRITIS PAIN) 650 MG CR tablet Take 1,300 mg by mouth 3 (three) times daily as needed.       [name] b complex vitamins tablet Take 1 tablet by mouth daily.      [name] BIOTIN 5000 PO Take 10,000 mcg by mouth daily.       . Calcium Carbonate-Vitamin D (CALCIUM 600+D) 600-200 MG-UNIT TABS Take 1 tablet by mouth daily.      . cholecalciferol (VITAMIN D) 1000 UNITS tablet Take 4,000 Units by mouth daily.      . citalopram (CELEXA) 20 MG tablet Take 20 mg by mouth daily.       [name]  doxylamine, Sleep, (UNISOM) 25 MG tablet Take 25 mg by mouth at bedtime as needed for sleep.      [name] glucosamine-chondroitin 500-400 MG tablet Take 1 tablet by mouth daily.      [name] losartan-hydrochlorothiazide (HYZAAR) 100-25 MG per tablet Take 1 tablet by mouth daily.      . Melatonin 3 MG TABS Take 3 mg by mouth at bedtime as needed (sleep).      . Milk Thistle 1000 MG CAPS Take 1 capsule by mouth daily.      . Omega-3 Fatty Acids (FISH OIL) 1000 MG CAPS Take 1 capsule by mouth daily.      . PredniSONE 1 MG TBEC Take 4 mg by mouth every morning. 2 mg at night, 81m in the morning      . simvastatin (ZOCOR) 40 MG tablet Take 40 mg by mouth every morning.        No current facility-administered medications for this visit.       Physical Exam: BP 129/90  Pulse 89  Resp 16  Ht _0  (1.676 m)  Wt 167 lb (75.751 kg)  BMI 26.97  kg/m2  SpO2 97%  General appearance: alert, cooperative and no distress Neurologic: intact Heart: regular rate and rhythm, S1, S2 normal, no murmur, click, rub or gallop Lungs: clear to auscultation bilaterally and normal percussion bilaterally Abdomen: soft, non-tender; bowel sounds normal; no masses,  no organomegaly Extremities: extremities normal, atraumatic, no cyanosis or edema and Homans sign is negative, no sign of DVT Patient has no cervical or supraclavicular adenopathy  Diagnostic Studies & Laboratory data:         Recent Radiology Findings: Ct Chest W Contrast  12/29/2013   CLINICAL DATA:  Follow-up right lung cancer diagnosed 2013, chemotherapy complete  EXAM: CT CHEST WITH CONTRAST  TECHNIQUE: Multidetector CT imaging of the chest was performed during intravenous contrast administration.  CONTRAST:  89mOMNIPAQUE IOHEXOL 300 MG/ML  SOLN  COMPARISON:  09/28/2013  FINDINGS: Status post right middle lobectomy. Additional right paramediastinal radiation changes.  Progression of bilateral pulmonary nodules, suspicious for metastases, including:  --7 mm right upper lobe nodule (series 5/ image 12), previously 6 mm  --7 x 4 mm left upper lobe nodule (series 5/image 13), previously 6 x 3 mm  --5 mm right upper lobe nodule (series 5/image 18), previously 2-3 mm  --5 mm subpleural left lower lobe nodule (series 5/ image 14), previously 2-3 mm  --4 mm subpleural right lower lobe nodule (series 5/ image 43), possibly new  Underlying mild centrilobular emphysematous changes. No pleural effusion or pneumothorax.  Visualized thyroid is unremarkable.  The heart is normal in size. No pericardial effusion. Mild coronary atherosclerosis in the LAD. Mild atherosclerotic calcifications of the aortic arch.  6 mm short axis right supraclavicular node (series 2/image 5), unchanged. No suspicious mediastinal, hilar, or axillary lymphadenopathy.  Visualized upper abdomen is notable for a small hiatal hernia,  cholelithiasis, and a stable 2.6 x 1.9 cm left adrenal nodule (series 2/ image 23), possibly reflecting an adrenal adenoma.  Degenerative changes of the visualized thoracolumbar spine.  IMPRESSION: Status post right middle lobectomy with right paramediastinal radiation changes.  Progression of bilateral pulmonary nodules, measuring up to 7 mm in the right upper lobe, suspicious for metastases.  Stable 6 mm short axis right supraclavicular node. No suspicious mediastinal, hilar, or axillary lymphadenopathy.   Electronically Signed   By: SrJulian Hy.D.   On: 12/29/2013 13:14  Recent Labs: Lab Results  Component Value Date   WBC 8.9 12/29/2013   HGB 10.8* 12/29/2013   HCT 33.6* 12/29/2013   PLT 143* 12/29/2013   GLUCOSE 97 12/29/2013   ALT 20 12/29/2013   AST 19 12/29/2013   NA 141 12/29/2013   K 3.9 12/29/2013   CL 99 02/11/2013   CREATININE 0.8 12/29/2013   BUN 13.9 12/29/2013   CO2 29 12/29/2013   INR 0.98 02/04/2013      Assessment / Plan:   Patient returns with followup CT scan suggestive of progressive bilateral pulmonary metastasis, measuring up to 7 mm in size with definite progression since the scan in December of 2014. I discussed these findings with the patient she has a followup appointment with Dr. Julien Nordmann next week. Original tumor resection revealed EGFR Exon19 mutation detected. I will discuss with Dr. Julien Nordmann the pros and cons of repeat biopsy or proceeding with targeted therapy.  Kalieb Freeland B 12/30/2013 3:33 PM

## 2014-01-05 ENCOUNTER — Telehealth: Payer: Self-pay | Admitting: *Deleted

## 2014-01-05 ENCOUNTER — Encounter: Payer: Self-pay | Admitting: *Deleted

## 2014-01-05 ENCOUNTER — Encounter: Payer: Self-pay | Admitting: Internal Medicine

## 2014-01-05 ENCOUNTER — Telehealth: Payer: Self-pay | Admitting: Internal Medicine

## 2014-01-05 ENCOUNTER — Ambulatory Visit (HOSPITAL_BASED_OUTPATIENT_CLINIC_OR_DEPARTMENT_OTHER): Payer: PRIVATE HEALTH INSURANCE | Admitting: Internal Medicine

## 2014-01-05 VITALS — BP 139/62 | HR 95 | Temp 98.6°F | Resp 18 | Ht 66.0 in | Wt 174.8 lb

## 2014-01-05 DIAGNOSIS — C3490 Malignant neoplasm of unspecified part of unspecified bronchus or lung: Secondary | ICD-10-CM

## 2014-01-05 DIAGNOSIS — C342 Malignant neoplasm of middle lobe, bronchus or lung: Secondary | ICD-10-CM

## 2014-01-05 MED ORDER — ERLOTINIB HCL 150 MG PO TABS: 150.0000 mg | ORAL_TABLET | Freq: Every day | ORAL | Status: DC

## 2014-01-05 NOTE — Telephone Encounter (Signed)
gv adn printed appt sched and avs for pt for April  °

## 2014-01-05 NOTE — Progress Notes (Signed)
Rx for Tarceva E-prescribed to [place].  Demographics faxed to [phone].  SLJ

## 2014-01-05 NOTE — Progress Notes (Signed)
Tarceva kit given to pt.  Pt is aware to call once she starts the tarceva so we can adjust her f/u appt if needed.  SLJ

## 2014-01-05 NOTE — Progress Notes (Signed)
Per [facility] outpatient pharmacy, rx for tarceva needs to be filled by [facility].  SLJ

## 2014-01-05 NOTE — Telephone Encounter (Signed)
Received prior authorization from [place] re: Tarceva needs to be filled by [place].  Gave form to care management.
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
## 2014-01-05 NOTE — Addendum Note (Signed)
Addended by: Britt Bottom on: 01/05/2014 01:26 PM   Modules accepted: Orders

## 2014-01-06 ENCOUNTER — Telehealth: Payer: Self-pay | Admitting: *Deleted

## 2014-01-06 ENCOUNTER — Encounter: Payer: Self-pay | Admitting: Internal Medicine

## 2014-01-06 ENCOUNTER — Other Ambulatory Visit: Payer: PRIVATE HEALTH INSURANCE

## 2014-01-06 NOTE — Telephone Encounter (Signed)
Pt called wanting to know whether she needed to go to chemo education.  She would prefer not to because of her work schedule.  She went with her 1st cycle of systemic chemo in the past.  She states she feels she has the information she needs after speaking with Dr [facility] RN regarding the drug during her office visit on 01/05/14.    Starter packet and drug information was given to her, and oral consent was signed.  Chemo education class cancelled.    SLJ

## 2014-01-06 NOTE — Progress Notes (Signed)
No date of treatment as of today.

## 2014-01-18 ENCOUNTER — Other Ambulatory Visit: Payer: Self-pay | Admitting: *Deleted

## 2014-01-18 NOTE — Telephone Encounter (Signed)
Received call from Patient, she has not received the Tarceva yet.  Called and spoke to [facility].  They did not receive the e-prescribe for Tarceva sent 01/05/14.  Called in the rx to [place], Software engineer at [facility].  He stated he was not sure why the e-prescribe was not sent through.  Informed patient, appt will be delayed by 2 weeks.  SLJ

## 2014-01-19 ENCOUNTER — Other Ambulatory Visit: Payer: PRIVATE HEALTH INSURANCE

## 2014-01-19 ENCOUNTER — Telehealth: Payer: Self-pay | Admitting: Internal Medicine

## 2014-01-19 ENCOUNTER — Ambulatory Visit: Payer: PRIVATE HEALTH INSURANCE | Admitting: Internal Medicine

## 2014-01-19 NOTE — Telephone Encounter (Signed)
lvm for pt regarding to May appt....mailed pt appt sched/letter

## 2014-01-20 ENCOUNTER — Encounter: Payer: Self-pay | Admitting: Internal Medicine

## 2014-01-20 ENCOUNTER — Telehealth: Payer: Self-pay | Admitting: *Deleted

## 2014-01-20 NOTE — Progress Notes (Signed)
[employer], [id number], approved tarceva 150mg  from 01/20/14-09/29/38

## 2014-01-20 NOTE — Telephone Encounter (Signed)
Received call that prior authorization is required for tarceva and to call [phone].  Msg forwarded to [facility] in medical mgmt.  SLJ

## 2014-01-21 ENCOUNTER — Telehealth: Payer: Self-pay | Admitting: *Deleted

## 2014-01-21 NOTE — Telephone Encounter (Signed)
Received call regarding the prior authorization for Tarceva.  [facility] is requesting a call to process the prior authorization.  Message forwarded to [facility]. SLJ

## 2014-02-08 ENCOUNTER — Other Ambulatory Visit (HOSPITAL_BASED_OUTPATIENT_CLINIC_OR_DEPARTMENT_OTHER): Payer: PRIVATE HEALTH INSURANCE

## 2014-02-08 ENCOUNTER — Telehealth: Payer: Self-pay | Admitting: Internal Medicine

## 2014-02-08 ENCOUNTER — Ambulatory Visit (HOSPITAL_BASED_OUTPATIENT_CLINIC_OR_DEPARTMENT_OTHER): Payer: PRIVATE HEALTH INSURANCE | Admitting: Internal Medicine

## 2014-02-08 ENCOUNTER — Encounter: Payer: Self-pay | Admitting: Internal Medicine

## 2014-02-08 VITALS — BP 137/74 | HR 100 | Temp 97.2°F | Resp 19 | Ht 66.0 in | Wt 174.7 lb

## 2014-02-08 DIAGNOSIS — C342 Malignant neoplasm of middle lobe, bronchus or lung: Secondary | ICD-10-CM

## 2014-02-08 DIAGNOSIS — C3490 Malignant neoplasm of unspecified part of unspecified bronchus or lung: Secondary | ICD-10-CM

## 2014-02-08 LAB — COMPREHENSIVE METABOLIC PANEL (CC13)
ALT: 16 U/L (ref 0–55)
ANION GAP: 13 meq/L — AB (ref 3–11)
AST: 21 U/L (ref 5–34)
Albumin: 3.9 g/dL (ref 3.5–5.0)
Alkaline Phosphatase: 79 U/L (ref 40–150)
BUN: 14.9 mg/dL (ref 7.0–26.0)
CO2: 26 mEq/L (ref 22–29)
CREATININE: 0.9 mg/dL (ref 0.6–1.1)
Calcium: 9.5 mg/dL (ref 8.4–10.4)
Chloride: 101 mEq/L (ref 98–109)
Glucose: 82 mg/dl (ref 70–140)
Potassium: 3.5 mEq/L (ref 3.5–5.1)
Sodium: 140 mEq/L (ref 136–145)
Total Bilirubin: 0.91 mg/dL (ref 0.20–1.20)
Total Protein: 7.1 g/dL (ref 6.4–8.3)

## 2014-02-08 LAB — CBC WITH DIFFERENTIAL/PLATELET
BASO%: 0.2 % (ref 0.0–2.0)
BASOS ABS: 0 10*3/uL (ref 0.0–0.1)
EOS ABS: 0 10*3/uL (ref 0.0–0.5)
EOS%: 0.2 % (ref 0.0–7.0)
HCT: 38 % (ref 34.8–46.6)
HEMOGLOBIN: 11.9 g/dL (ref 11.6–15.9)
LYMPH%: 12.9 % — AB (ref 14.0–49.7)
MCH: 28.3 pg (ref 25.1–34.0)
MCHC: 31.3 g/dL — ABNORMAL LOW (ref 31.5–36.0)
MCV: 90.3 fL (ref 79.5–101.0)
MONO#: 2.4 10*3/uL — ABNORMAL HIGH (ref 0.1–0.9)
MONO%: 24.6 % — AB (ref 0.0–14.0)
NEUT#: 6.1 10*3/uL (ref 1.5–6.5)
NEUT%: 62.1 % (ref 38.4–76.8)
Platelets: 142 10*3/uL — ABNORMAL LOW (ref 145–400)
RBC: 4.21 10*6/uL (ref 3.70–5.45)
RDW: 16.4 % — AB (ref 11.2–14.5)
WBC: 9.9 10*3/uL (ref 3.9–10.3)
lymph#: 1.3 10*3/uL (ref 0.9–3.3)
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
## 2014-02-08 NOTE — Telephone Encounter (Signed)
gv adn printed appt sched and avs for pt for May

## 2014-02-22 ENCOUNTER — Other Ambulatory Visit: Payer: Self-pay | Admitting: Medical Oncology

## 2014-02-22 DIAGNOSIS — C3490 Malignant neoplasm of unspecified part of unspecified bronchus or lung: Secondary | ICD-10-CM

## 2014-02-22 MED ORDER — ERLOTINIB HCL 150 MG PO TABS: 150.0000 mg | ORAL_TABLET | Freq: Every day | ORAL | Status: DC

## 2014-02-22 NOTE — Telephone Encounter (Signed)
Pt called and has not received tarceva. I sent rx to [facility] to go to [facility].

## 2014-02-23 ENCOUNTER — Encounter: Payer: Self-pay | Admitting: Physician Assistant

## 2014-02-23 ENCOUNTER — Ambulatory Visit (HOSPITAL_BASED_OUTPATIENT_CLINIC_OR_DEPARTMENT_OTHER): Payer: PRIVATE HEALTH INSURANCE | Admitting: Physician Assistant

## 2014-02-23 ENCOUNTER — Ambulatory Visit (HOSPITAL_COMMUNITY)
Admission: RE | Admit: 2014-02-23 | Discharge: 2014-02-23 | Disposition: A | Discharge status: Home / Self Care | Payer: PRIVATE HEALTH INSURANCE | Source: Ambulatory Visit | Attending: Physician Assistant | Admitting: Physician Assistant

## 2014-02-23 ENCOUNTER — Telehealth: Payer: Self-pay | Admitting: Internal Medicine

## 2014-02-23 ENCOUNTER — Other Ambulatory Visit (HOSPITAL_BASED_OUTPATIENT_CLINIC_OR_DEPARTMENT_OTHER): Payer: PRIVATE HEALTH INSURANCE

## 2014-02-23 VITALS — BP 137/74 | HR 97 | Temp 98.3°F | Resp 18 | Ht 66.0 in | Wt 173.2 lb

## 2014-02-23 DIAGNOSIS — Z9221 Personal history of antineoplastic chemotherapy: Secondary | ICD-10-CM | POA: Insufficient documentation

## 2014-02-23 DIAGNOSIS — M79609 Pain in unspecified limb: Secondary | ICD-10-CM

## 2014-02-23 DIAGNOSIS — C342 Malignant neoplasm of middle lobe, bronchus or lung: Secondary | ICD-10-CM

## 2014-02-23 DIAGNOSIS — R21 Rash and other nonspecific skin eruption: Secondary | ICD-10-CM

## 2014-02-23 DIAGNOSIS — M7989 Other specified soft tissue disorders: Secondary | ICD-10-CM | POA: Insufficient documentation

## 2014-02-23 DIAGNOSIS — R600 Localized edema: Secondary | ICD-10-CM

## 2014-02-23 DIAGNOSIS — C3490 Malignant neoplasm of unspecified part of unspecified bronchus or lung: Secondary | ICD-10-CM | POA: Insufficient documentation

## 2014-02-23 LAB — CBC WITH DIFFERENTIAL/PLATELET
BASO%: 0.5 % (ref 0.0–2.0)
BASOS ABS: 0 10*3/uL (ref 0.0–0.1)
EOS ABS: 0 10*3/uL (ref 0.0–0.5)
EOS%: 0.2 % (ref 0.0–7.0)
HCT: 37 % (ref 34.8–46.6)
HEMOGLOBIN: 12 g/dL (ref 11.6–15.9)
LYMPH#: 1 10*3/uL (ref 0.9–3.3)
LYMPH%: 11 % — ABNORMAL LOW (ref 14.0–49.7)
MCH: 28.6 pg (ref 25.1–34.0)
MCHC: 32.3 g/dL (ref 31.5–36.0)
MCV: 88.6 fL (ref 79.5–101.0)
MONO#: 2.6 10*3/uL — ABNORMAL HIGH (ref 0.1–0.9)
MONO%: 27.4 % — ABNORMAL HIGH (ref 0.0–14.0)
NEUT#: 5.7 10*3/uL (ref 1.5–6.5)
NEUT%: 60.9 % (ref 38.4–76.8)
Platelets: 168 10*3/uL (ref 145–400)
RBC: 4.17 10*6/uL (ref 3.70–5.45)
RDW: 16.5 % — AB (ref 11.2–14.5)
WBC: 9.3 10*3/uL (ref 3.9–10.3)

## 2014-02-23 LAB — COMPREHENSIVE METABOLIC PANEL (CC13)
ALBUMIN: 3.8 g/dL (ref 3.5–5.0)
ALK PHOS: 80 U/L (ref 40–150)
ALT: 26 U/L (ref 0–55)
AST: 25 U/L (ref 5–34)
Anion Gap: 10 mEq/L (ref 3–11)
BUN: 15.1 mg/dL (ref 7.0–26.0)
CALCIUM: 9.6 mg/dL (ref 8.4–10.4)
CHLORIDE: 102 meq/L (ref 98–109)
CO2: 29 mEq/L (ref 22–29)
Creatinine: 0.8 mg/dL (ref 0.6–1.1)
GLUCOSE: 98 mg/dL (ref 70–140)
POTASSIUM: 3.9 meq/L (ref 3.5–5.1)
SODIUM: 141 meq/L (ref 136–145)
TOTAL PROTEIN: 6.9 g/dL (ref 6.4–8.3)
Total Bilirubin: 0.84 mg/dL (ref 0.20–1.20)
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
## 2014-02-23 NOTE — Progress Notes (Signed)
VASCULAR LAB PRELIMINARY  PRELIMINARY  PRELIMINARY  PRELIMINARY  Right lower extremity venous duplex completed.    Preliminary report:  Right:  No evidence of DVT, superficial thrombosis, or Baker's cyst.  [place], RVS 02/23/2014, 1:42 PM

## 2014-02-23 NOTE — Telephone Encounter (Signed)
gv adn printed appt sched adn avs forpt for June.Marland KitchenMarland Kitchen

## 2014-02-24 NOTE — Patient Instructions (Signed)
Continue taking Tarceva 150 mg by mouth daily Apply warm compresses to the right calf. Notify us immediately if your symptoms persist or worsen Followup in one month with a restaging CT scan of your chest to reevaluate your disease

## 2014-03-21 ENCOUNTER — Other Ambulatory Visit: Payer: PRIVATE HEALTH INSURANCE

## 2014-03-21 ENCOUNTER — Encounter (HOSPITAL_COMMUNITY): Payer: Self-pay

## 2014-03-21 ENCOUNTER — Ambulatory Visit (HOSPITAL_COMMUNITY)
Admission: RE | Admit: 2014-03-21 | Discharge: 2014-03-21 | Disposition: A | Discharge status: Home / Self Care | Payer: PRIVATE HEALTH INSURANCE | Source: Ambulatory Visit | Attending: Physician Assistant | Admitting: Physician Assistant

## 2014-03-21 DIAGNOSIS — R918 Other nonspecific abnormal finding of lung field: Secondary | ICD-10-CM | POA: Insufficient documentation

## 2014-03-21 DIAGNOSIS — K449 Diaphragmatic hernia without obstruction or gangrene: Secondary | ICD-10-CM | POA: Insufficient documentation

## 2014-03-21 DIAGNOSIS — C3490 Malignant neoplasm of unspecified part of unspecified bronchus or lung: Secondary | ICD-10-CM

## 2014-03-21 DIAGNOSIS — K8020 Calculus of gallbladder without cholecystitis without obstruction: Secondary | ICD-10-CM | POA: Insufficient documentation

## 2014-03-21 DIAGNOSIS — E278 Other specified disorders of adrenal gland: Secondary | ICD-10-CM | POA: Insufficient documentation

## 2014-03-21 LAB — CBC WITH DIFFERENTIAL/PLATELET
BASO%: 0.3 % (ref 0.0–2.0)
BASOS ABS: 0 10*3/uL (ref 0.0–0.1)
EOS%: 0.5 % (ref 0.0–7.0)
Eosinophils Absolute: 0 10*3/uL (ref 0.0–0.5)
HCT: 38 % (ref 34.8–46.6)
HEMOGLOBIN: 11.9 g/dL (ref 11.6–15.9)
LYMPH#: 1.2 10*3/uL (ref 0.9–3.3)
LYMPH%: 20 % (ref 14.0–49.7)
MCH: 28.4 pg (ref 25.1–34.0)
MCHC: 31.3 g/dL — ABNORMAL LOW (ref 31.5–36.0)
MCV: 90.7 fL (ref 79.5–101.0)
MONO#: 1.4 10*3/uL — ABNORMAL HIGH (ref 0.1–0.9)
MONO%: 23.6 % — AB (ref 0.0–14.0)
NEUT%: 55.6 % (ref 38.4–76.8)
NEUTROS ABS: 3.3 10*3/uL (ref 1.5–6.5)
NRBC: 0 % (ref 0–0)
Platelets: 150 10*3/uL (ref 145–400)
RBC: 4.19 10*6/uL (ref 3.70–5.45)
RDW: 15.8 % — AB (ref 11.2–14.5)
WBC: 5.9 10*3/uL (ref 3.9–10.3)

## 2014-03-21 LAB — COMPREHENSIVE METABOLIC PANEL (CC13)
ALBUMIN: 3.9 g/dL (ref 3.5–5.0)
ALT: 22 U/L (ref 0–55)
AST: 23 U/L (ref 5–34)
Alkaline Phosphatase: 71 U/L (ref 40–150)
Anion Gap: 10 mEq/L (ref 3–11)
BILIRUBIN TOTAL: 0.87 mg/dL (ref 0.20–1.20)
BUN: 14 mg/dL (ref 7.0–26.0)
CO2: 29 mEq/L (ref 22–29)
Calcium: 9.6 mg/dL (ref 8.4–10.4)
Chloride: 101 mEq/L (ref 98–109)
Creatinine: 0.8 mg/dL (ref 0.6–1.1)
GLUCOSE: 108 mg/dL (ref 70–140)
POTASSIUM: 3.8 meq/L (ref 3.5–5.1)
SODIUM: 140 meq/L (ref 136–145)
TOTAL PROTEIN: 7 g/dL (ref 6.4–8.3)

## 2014-03-21 MED ORDER — IOHEXOL 300 MG/ML  SOLN
80.0000 mL | Freq: Once | INTRAMUSCULAR | Status: AC | PRN
  Administered 2014-03-21: 80 mL via INTRAVENOUS

## 2014-03-23 ENCOUNTER — Telehealth: Payer: Self-pay | Admitting: Internal Medicine

## 2014-03-23 ENCOUNTER — Ambulatory Visit (HOSPITAL_BASED_OUTPATIENT_CLINIC_OR_DEPARTMENT_OTHER): Payer: PRIVATE HEALTH INSURANCE | Admitting: Internal Medicine

## 2014-03-23 ENCOUNTER — Encounter: Payer: Self-pay | Admitting: Internal Medicine

## 2014-03-23 VITALS — BP 144/97 | HR 114 | Temp 98.3°F | Resp 18 | Ht 66.0 in | Wt 170.8 lb

## 2014-03-23 DIAGNOSIS — R21 Rash and other nonspecific skin eruption: Secondary | ICD-10-CM

## 2014-03-23 DIAGNOSIS — C3490 Malignant neoplasm of unspecified part of unspecified bronchus or lung: Secondary | ICD-10-CM

## 2014-03-23 DIAGNOSIS — R197 Diarrhea, unspecified: Secondary | ICD-10-CM

## 2014-03-23 DIAGNOSIS — C342 Malignant neoplasm of middle lobe, bronchus or lung: Secondary | ICD-10-CM

## 2014-03-23 MED ORDER — CLINDAMYCIN PHOSPHATE 1 % EX SOLN
Freq: Two times a day (BID) | CUTANEOUS | Status: DC
Start: 2014-03-23 — End: 2014-07-28

## 2014-03-23 MED ORDER — METHYLPREDNISOLONE (PAK) 4 MG PO TABS: ORAL_TABLET | ORAL | Status: DC
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
## 2014-03-23 NOTE — Telephone Encounter (Signed)
Gave pt appt for lab and MD for July 2015

## 2014-03-29 ENCOUNTER — Encounter: Payer: Self-pay | Admitting: Internal Medicine

## 2014-03-29 NOTE — Telephone Encounter (Signed)
Printed request for review.

## 2014-04-04 ENCOUNTER — Other Ambulatory Visit: Payer: Self-pay | Admitting: *Deleted

## 2014-04-04 NOTE — Telephone Encounter (Signed)
THIS REFILL REQUEST FOR TARCEVA WAS GIVEN TO DR.MOHAMED'S NURSE, DIANE BELL,RN.

## 2014-04-05 ENCOUNTER — Other Ambulatory Visit: Payer: Self-pay | Admitting: Medical Oncology

## 2014-04-05 DIAGNOSIS — C3490 Malignant neoplasm of unspecified part of unspecified bronchus or lung: Secondary | ICD-10-CM

## 2014-04-05 MED ORDER — ERLOTINIB HCL 150 MG PO TABS: 150.0000 mg | ORAL_TABLET | Freq: Every day | ORAL | Status: DC

## 2014-04-05 NOTE — Telephone Encounter (Signed)
traceva refilled.

## 2014-04-25 ENCOUNTER — Telehealth: Payer: Self-pay | Admitting: Internal Medicine

## 2014-04-25 ENCOUNTER — Encounter: Payer: Self-pay | Admitting: Internal Medicine

## 2014-04-25 ENCOUNTER — Ambulatory Visit (HOSPITAL_BASED_OUTPATIENT_CLINIC_OR_DEPARTMENT_OTHER): Payer: PRIVATE HEALTH INSURANCE | Admitting: Internal Medicine

## 2014-04-25 ENCOUNTER — Other Ambulatory Visit (HOSPITAL_BASED_OUTPATIENT_CLINIC_OR_DEPARTMENT_OTHER): Payer: PRIVATE HEALTH INSURANCE

## 2014-04-25 VITALS — BP 137/82 | HR 108 | Temp 98.0°F | Resp 18 | Ht 66.0 in | Wt 164.3 lb

## 2014-04-25 DIAGNOSIS — R21 Rash and other nonspecific skin eruption: Secondary | ICD-10-CM

## 2014-04-25 DIAGNOSIS — C342 Malignant neoplasm of middle lobe, bronchus or lung: Secondary | ICD-10-CM

## 2014-04-25 DIAGNOSIS — C3491 Malignant neoplasm of unspecified part of right bronchus or lung: Secondary | ICD-10-CM

## 2014-04-25 DIAGNOSIS — R197 Diarrhea, unspecified: Secondary | ICD-10-CM

## 2014-04-25 DIAGNOSIS — C3490 Malignant neoplasm of unspecified part of unspecified bronchus or lung: Secondary | ICD-10-CM

## 2014-04-25 LAB — CBC WITH DIFFERENTIAL/PLATELET
BASO%: 0.2 % (ref 0.0–2.0)
Basophils Absolute: 0 10*3/uL (ref 0.0–0.1)
EOS%: 0.3 % (ref 0.0–7.0)
Eosinophils Absolute: 0 10*3/uL (ref 0.0–0.5)
HEMATOCRIT: 39 % (ref 34.8–46.6)
HGB: 12.6 g/dL (ref 11.6–15.9)
LYMPH%: 11.7 % — AB (ref 14.0–49.7)
MCH: 28.5 pg (ref 25.1–34.0)
MCHC: 32.2 g/dL (ref 31.5–36.0)
MCV: 88.4 fL (ref 79.5–101.0)
MONO#: 2.6 10*3/uL — ABNORMAL HIGH (ref 0.1–0.9)
MONO%: 28.8 % — ABNORMAL HIGH (ref 0.0–14.0)
NEUT#: 5.3 10*3/uL (ref 1.5–6.5)
NEUT%: 59 % (ref 38.4–76.8)
PLATELETS: 179 10*3/uL (ref 145–400)
RBC: 4.41 10*6/uL (ref 3.70–5.45)
RDW: 16.7 % — ABNORMAL HIGH (ref 11.2–14.5)
WBC: 9 10*3/uL (ref 3.9–10.3)
lymph#: 1 10*3/uL (ref 0.9–3.3)

## 2014-04-25 LAB — COMPREHENSIVE METABOLIC PANEL (CC13)
ALT: 24 U/L (ref 0–55)
ANION GAP: 12 meq/L — AB (ref 3–11)
AST: 24 U/L (ref 5–34)
Albumin: 3.9 g/dL (ref 3.5–5.0)
Alkaline Phosphatase: 72 U/L (ref 40–150)
BUN: 13.8 mg/dL (ref 7.0–26.0)
CO2: 30 meq/L — AB (ref 22–29)
CREATININE: 1 mg/dL (ref 0.6–1.1)
Calcium: 9.9 mg/dL (ref 8.4–10.4)
Chloride: 99 mEq/L (ref 98–109)
Glucose: 99 mg/dl (ref 70–140)
Potassium: 3.2 mEq/L — ABNORMAL LOW (ref 3.5–5.1)
Sodium: 140 mEq/L (ref 136–145)
Total Bilirubin: 1.02 mg/dL (ref 0.20–1.20)
Total Protein: 7.1 g/dL (ref 6.4–8.3)

## 2014-04-25 LAB — TECHNOLOGIST REVIEW

## 2014-04-25 NOTE — Telephone Encounter (Signed)
Pt confirmed labs/ov per 07/27 POF, gave pt AVS....KJ
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
## 2014-04-25 NOTE — Progress Notes (Signed)
Quick Note:  Call patient with the result and encourage K rich diet ______ 

## 2014-04-29 ENCOUNTER — Telehealth: Payer: Self-pay | Admitting: Medical Oncology

## 2014-04-29 NOTE — Telephone Encounter (Signed)
I left a voice message for pt to add  K+ rich foods todiet including: bananas,oranges, avocado, squash, raisins, potatoes, cabbage,cauliflower, tuna,honey,beef,chicken,sardines,milk,turkey.

## 2014-04-29 NOTE — Telephone Encounter (Signed)
Message copied by Ardeen Garland on Fri Apr 29, 2014 11:17 AM ------      Message from: Curt Bears      Created: Mon Apr 25, 2014  1:26 PM       Call patient with the result and encourage K rich diet ------

## 2014-05-05 ENCOUNTER — Encounter: Payer: Self-pay | Admitting: Cardiothoracic Surgery

## 2014-05-05 ENCOUNTER — Ambulatory Visit (INDEPENDENT_AMBULATORY_CARE_PROVIDER_SITE_OTHER): Payer: PRIVATE HEALTH INSURANCE | Admitting: Cardiothoracic Surgery

## 2014-05-05 ENCOUNTER — Encounter: Payer: Self-pay | Admitting: *Deleted

## 2014-05-05 VITALS — BP 131/80 | HR 115 | Ht 66.0 in | Wt 164.0 lb

## 2014-05-05 DIAGNOSIS — C342 Malignant neoplasm of middle lobe, bronchus or lung: Secondary | ICD-10-CM

## 2014-05-05 NOTE — Progress Notes (Signed)
[facility] Record #[id number] Date of Birth: [birth year]  [name], MD  [name], MD  Chief Complaint:   PostOp Follow Up Visit DATE OF PROCEDURE: 02/08/2013  PREOPERATIVE DIAGNOSIS: Carcinoma of the right middle lobe, status post  radiation and chemotherapy treatment.  POSTOPERATIVE DIAGNOSIS: Carcinoma of the right middle lobe, status  post radiation and chemotherapy treatment.  SURGICAL PROCEDURE: Right video-assisted thoracoscopy and thoracotomy,  right middle lobectomy, wedge resection of superior segment of right  lower lobe, lymph node sampling, placement of On-Q device, and  bronchoscopy,  PATH:  Stage IB  ( ypT2a, ypN0, cM0) EGFR Exon19 mutation detected INVASIVE ADENOCARCINOMA (1.5 CM). Also BENIGN LUNG WITH NECROTIZING GRANULOMATA Lung cancer   Primary site: Lung (Right)   Staging method: AJCC 7th Edition   Clinical free text: Recurrent non-small cell lung cancer initially diagnosed as Stage IIIA NSCLC, with positive EGFR mutation   Clinical: Stage IIIA (T1b, N2, M0) signed by Curt Bears, MD on 02/08/2014 11:09 AM   Pathologic free text: ypT2a,ypN0,cM0  Stage IB   Summary: Stage IIIA (T1b, N2, M0)  History of Present Illness:      Patient presented with clinical Stage IIIA (T1b., N2, M0) non-small cell lung cancer suspicious for adenocarcinoma with EGFR mutation in exon 19 and negative ALK gene translocation, diagnosed in December of 2013.   She underwent a course of Neoadjuvant concurrent chemoradiation with chemotherapy in the form of weekly carboplatin for an AUC of 2 and paclitaxel 45 mg per meter squared, last dose was given 11/20/2012 with partial response.  On 02/08/2013 patient underwent surgical resection for invasive adenocarcinoma after treatment staged 1B. She also was noted in the specimen did have necrotizing granuloma. She saw infectious disease for that, no further treatment was warranted.  The  patient well with exception of side effects from Tarceva, some diarrhea, and skin irritations, particularly around the eyes. She has seen ophthalmology for this  Zubrod Score: At the time of surgery this patient's most appropriate activity status/level should be described as: $RemoveBefor'[]'VRVmQEjJgiBY$     0    Normal activity, no symptoms $RemoveBef'[x]'TXtvWrDrYN$     1    Restricted in physical strenuous activity but ambulatory, able to do out light work $RemoveBe'[]'GomsdXqgN$     2    Ambulatory and capable of self care, unable to do work activities, up and about >50 % of waking hours                              '[]'$     3    Only limited self care, in bed greater than 50% of waking hours $RemoveBefo'[]'OWZaFFyBLZv$     4    Completely disabled, no self care, confined to bed or chair $Remove'[]'SoWYiMl$     5    Moribund  History  Smoking status  . Former Smoker -- 0.30 packs/day for 36 years  . Types: Cigarettes  . Quit date: 09/23/2012  Smokeless tobacco  . Never Used       No Known Allergies  Current Outpatient Prescriptions  Medication Sig Dispense Refill  . acetaminophen (TYLENOL ARTHRITIS PAIN) 650 MG CR tablet Take 1,300 mg by mouth 3 (three) times daily as needed.       [name] b complex vitamins tablet Take 1 tablet by mouth daily.      [name]  BIOTIN 5000 PO Take 10,000 mcg by mouth daily.       . Calcium Carbonate-Vitamin D (CALCIUM 600+D) 600-200 MG-UNIT TABS Take 1 tablet by mouth daily.      . cholecalciferol (VITAMIN D) 1000 UNITS tablet Take 4,000 Units by mouth daily.      . citalopram (CELEXA) 20 MG tablet Take 20 mg by mouth daily.       . clindamycin (CLEOCIN-T) 1 % external solution Apply topically 2 (two) times daily.  30 mL  0  . erlotinib (TARCEVA) 150 MG tablet Take 1 tablet (150 mg total) by mouth daily. Take on an empty stomach 1 hour before meals or 2 hours after.  30 tablet  1  . losartan-hydrochlorothiazide (HYZAAR) 100-25 MG per tablet Take 1 tablet by mouth daily.      . Melatonin 3 MG TABS Take 5 mg by mouth at bedtime as needed (sleep).       . Milk Thistle 1000 MG  CAPS Take 1 capsule by mouth daily.      . Multiple Vitamins-Minerals (CENTRUM SILVER PO) Take 1 capsule by mouth daily.      . Omega-3 Fatty Acids (FISH OIL) 1000 MG CAPS Take 1 capsule by mouth daily.      . PredniSONE 1 MG TBEC Take 4 mg by mouth as directed. 2 mg at night, $RemoveB'3mg'EVqCxkVD$  in the morning      . loperamide (IMODIUM) 2 MG capsule Take by mouth as needed for diarrhea or loose stools.       No current facility-administered medications for this visit.       Physical Exam: BP 131/80  Pulse 115  Ht $R'5\' 6"'kT$  (1.676 m)  Wt 164 lb (74.39 kg)  BMI 26.48 kg/m2  SpO2 98%  General appearance: alert, cooperative and no distress Neurologic: intact Heart: regular rate and rhythm, S1, S2 normal, no murmur, click, rub or gallop Lungs: clear to auscultation bilaterally and normal percussion bilaterally Abdomen: soft, non-tender; bowel sounds normal; no masses,  no organomegaly Extremities: extremities normal, atraumatic, no cyanosis or edema and Homans sign is negative, no sign of DVT Patient has no cervical or supraclavicular adenopathy  Diagnostic Studies & Laboratory data:         Recent Radiology Findings: CLINICAL DATA: History of lung cancer diagnosed in 2013. Started on  chemotherapy 02/2014.  EXAM:  CT CHEST WITH CONTRAST  TECHNIQUE:  Multidetector CT imaging of the chest was performed during  intravenous contrast administration.  CONTRAST: 59mL OMNIPAQUE IOHEXOL 300 MG/ML SOLN  COMPARISON: CT chest 12/29/2013  FINDINGS:  Visualized thyroid is unremarkable. No enlarged axillary,  mediastinal or hilar lymphadenopathy. Normal heart size. No  pericardial effusion. Normal caliber aorta and main pulmonary  artery. Small hiatal hernia. Previously described 6 mm right  supraclavicular lymph node is no longer visualized.  Postsurgical change compatible with right middle lobectomy. The  majority of the previously described bilateral pulmonary nodules  have significantly decreased in  size in the interval. Reference 5 mm  right upper lobe nodule (image 15; series 5) is decreased in size  from 7 mm on prior where it was significantly more solid in  appearance. An additional 3 mm right upper lobe nodule (image 21;  series 5) is decreased in size from 5 mm on the prior. Previously  described 5 mm left lower lobe nodule is not visualized on today's  examination. Previously described 6 mm left upper lobe nodule as  decreased in size to 4 mm (image  15; series 5).  Visualization of the upper abdomen demonstrates cholelithiasis.  Additionally there is a stable 2.6 x 1.6 cm left adrenal nodule.  No aggressive or acute appearing osseous lesions.  IMPRESSION:  Interval decrease in size of multiple bilateral pulmonary nodules  when compared to recent prior examination.  Stable postsurgical changes.  Electronically Signed  By: Lovey Newcomer M.D.  On: 03/21/2014 10:37  Ct Chest W Contrast  12/29/2013   CLINICAL DATA:  Follow-up right lung cancer diagnosed 2013, chemotherapy complete  EXAM: CT CHEST WITH CONTRAST  TECHNIQUE: Multidetector CT imaging of the chest was performed during intravenous contrast administration.  CONTRAST:  41mL OMNIPAQUE IOHEXOL 300 MG/ML  SOLN  COMPARISON:  09/28/2013  FINDINGS: Status post right middle lobectomy. Additional right paramediastinal radiation changes.  Progression of bilateral pulmonary nodules, suspicious for metastases, including:  --7 mm right upper lobe nodule (series 5/ image 12), previously 6 mm  --7 x 4 mm left upper lobe nodule (series 5/image 13), previously 6 x 3 mm  --5 mm right upper lobe nodule (series 5/image 18), previously 2-3 mm  --5 mm subpleural left lower lobe nodule (series 5/ image 14), previously 2-3 mm  --4 mm subpleural right lower lobe nodule (series 5/ image 43), possibly new  Underlying mild centrilobular emphysematous changes. No pleural effusion or pneumothorax.  Visualized thyroid is unremarkable.  The heart is normal in  size. No pericardial effusion. Mild coronary atherosclerosis in the LAD. Mild atherosclerotic calcifications of the aortic arch.  6 mm short axis right supraclavicular node (series 2/image 5), unchanged. No suspicious mediastinal, hilar, or axillary lymphadenopathy.  Visualized upper abdomen is notable for a small hiatal hernia, cholelithiasis, and a stable 2.6 x 1.9 cm left adrenal nodule (series 2/ image 23), possibly reflecting an adrenal adenoma.  Degenerative changes of the visualized thoracolumbar spine.  IMPRESSION: Status post right middle lobectomy with right paramediastinal radiation changes.  Progression of bilateral pulmonary nodules, measuring up to 7 mm in the right upper lobe, suspicious for metastases.  Stable 6 mm short axis right supraclavicular node. No suspicious mediastinal, hilar, or axillary lymphadenopathy.   Electronically Signed   By: Julian Hy M.D.   On: 12/29/2013 13:14       Recent Labs: Lab Results  Component Value Date   WBC 9.0 04/25/2014   HGB 12.6 04/25/2014   HCT 39.0 04/25/2014   PLT 179 04/25/2014   GLUCOSE 99 04/25/2014   ALT 24 04/25/2014   AST 24 04/25/2014   NA 140 04/25/2014   K 3.2* 04/25/2014   CL 99 02/11/2013   CREATININE 1.0 04/25/2014   BUN 13.8 04/25/2014   CO2 30* 04/25/2014   INR 0.98 02/04/2013      Assessment / Plan:   Patient returns with followup CT scan done in June showing decrease in size of multiple small pulmonary nodules on Tarceva. The patient is followed on a monthly basis in the [facility], I've not made a return appointment to see me but would be glad to see her at her or Dr. Julien Nordmann to requested anytime.  Tryphena Perkovich B 05/05/2014 1:23 PM

## 2014-05-24 ENCOUNTER — Encounter: Payer: Self-pay | Admitting: Internal Medicine

## 2014-05-24 ENCOUNTER — Other Ambulatory Visit (HOSPITAL_BASED_OUTPATIENT_CLINIC_OR_DEPARTMENT_OTHER): Payer: PRIVATE HEALTH INSURANCE

## 2014-05-24 ENCOUNTER — Telehealth: Payer: Self-pay | Admitting: Internal Medicine

## 2014-05-24 ENCOUNTER — Ambulatory Visit (HOSPITAL_BASED_OUTPATIENT_CLINIC_OR_DEPARTMENT_OTHER): Payer: PRIVATE HEALTH INSURANCE | Admitting: Internal Medicine

## 2014-05-24 VITALS — BP 138/69 | HR 96 | Temp 98.3°F | Resp 18 | Ht 66.0 in | Wt 165.5 lb

## 2014-05-24 DIAGNOSIS — C342 Malignant neoplasm of middle lobe, bronchus or lung: Secondary | ICD-10-CM

## 2014-05-24 DIAGNOSIS — C3410 Malignant neoplasm of upper lobe, unspecified bronchus or lung: Secondary | ICD-10-CM

## 2014-05-24 DIAGNOSIS — C3491 Malignant neoplasm of unspecified part of right bronchus or lung: Secondary | ICD-10-CM

## 2014-05-24 LAB — CBC WITH DIFFERENTIAL/PLATELET
BASO%: 0.5 % (ref 0.0–2.0)
BASOS ABS: 0 10*3/uL (ref 0.0–0.1)
EOS ABS: 0 10*3/uL (ref 0.0–0.5)
EOS%: 0.5 % (ref 0.0–7.0)
HCT: 39.2 % (ref 34.8–46.6)
HEMOGLOBIN: 12.7 g/dL (ref 11.6–15.9)
LYMPH%: 13.3 % — ABNORMAL LOW (ref 14.0–49.7)
MCH: 28.9 pg (ref 25.1–34.0)
MCHC: 32.5 g/dL (ref 31.5–36.0)
MCV: 88.9 fL (ref 79.5–101.0)
MONO#: 2 10*3/uL — ABNORMAL HIGH (ref 0.1–0.9)
MONO%: 26.8 % — AB (ref 0.0–14.0)
NEUT%: 58.9 % (ref 38.4–76.8)
NEUTROS ABS: 4.4 10*3/uL (ref 1.5–6.5)
Platelets: 137 10*3/uL — ABNORMAL LOW (ref 145–400)
RBC: 4.4 10*6/uL (ref 3.70–5.45)
RDW: 17.3 % — AB (ref 11.2–14.5)
WBC: 7.5 10*3/uL (ref 3.9–10.3)
lymph#: 1 10*3/uL (ref 0.9–3.3)

## 2014-05-24 LAB — COMPREHENSIVE METABOLIC PANEL (CC13)
ALBUMIN: 3.8 g/dL (ref 3.5–5.0)
ALK PHOS: 72 U/L (ref 40–150)
ALT: 19 U/L (ref 0–55)
AST: 24 U/L (ref 5–34)
Anion Gap: 9 mEq/L (ref 3–11)
BUN: 16 mg/dL (ref 7.0–26.0)
CALCIUM: 9.4 mg/dL (ref 8.4–10.4)
CHLORIDE: 103 meq/L (ref 98–109)
CO2: 29 mEq/L (ref 22–29)
Creatinine: 0.8 mg/dL (ref 0.6–1.1)
GLUCOSE: 88 mg/dL (ref 70–140)
Potassium: 3.4 mEq/L — ABNORMAL LOW (ref 3.5–5.1)
Sodium: 140 mEq/L (ref 136–145)
Total Bilirubin: 0.89 mg/dL (ref 0.20–1.20)
Total Protein: 6.9 g/dL (ref 6.4–8.3)

## 2014-05-24 NOTE — Telephone Encounter (Signed)
Pt confirmed labs/ov per 08/25 POF, gave pt AVS....KJ
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
## 2014-06-02 ENCOUNTER — Other Ambulatory Visit: Payer: Self-pay | Admitting: Internal Medicine

## 2014-06-20 ENCOUNTER — Ambulatory Visit (HOSPITAL_COMMUNITY)
Admission: RE | Admit: 2014-06-20 | Discharge: 2014-06-20 | Disposition: A | Discharge status: Home / Self Care | Payer: PRIVATE HEALTH INSURANCE | Source: Ambulatory Visit | Attending: Internal Medicine | Admitting: Internal Medicine

## 2014-06-20 ENCOUNTER — Encounter (HOSPITAL_COMMUNITY): Payer: Self-pay

## 2014-06-20 ENCOUNTER — Other Ambulatory Visit (HOSPITAL_BASED_OUTPATIENT_CLINIC_OR_DEPARTMENT_OTHER): Payer: PRIVATE HEALTH INSURANCE

## 2014-06-20 DIAGNOSIS — R911 Solitary pulmonary nodule: Secondary | ICD-10-CM | POA: Insufficient documentation

## 2014-06-20 DIAGNOSIS — E279 Disorder of adrenal gland, unspecified: Secondary | ICD-10-CM | POA: Diagnosis not present

## 2014-06-20 DIAGNOSIS — C342 Malignant neoplasm of middle lobe, bronchus or lung: Secondary | ICD-10-CM

## 2014-06-20 DIAGNOSIS — Z9889 Other specified postprocedural states: Secondary | ICD-10-CM | POA: Diagnosis not present

## 2014-06-20 DIAGNOSIS — K4090 Unilateral inguinal hernia, without obstruction or gangrene, not specified as recurrent: Secondary | ICD-10-CM | POA: Insufficient documentation

## 2014-06-20 DIAGNOSIS — K8020 Calculus of gallbladder without cholecystitis without obstruction: Secondary | ICD-10-CM | POA: Insufficient documentation

## 2014-06-20 DIAGNOSIS — C3410 Malignant neoplasm of upper lobe, unspecified bronchus or lung: Secondary | ICD-10-CM

## 2014-06-20 DIAGNOSIS — C3490 Malignant neoplasm of unspecified part of unspecified bronchus or lung: Secondary | ICD-10-CM | POA: Insufficient documentation

## 2014-06-20 LAB — CBC WITH DIFFERENTIAL/PLATELET
BASO%: 0.7 % (ref 0.0–2.0)
Basophils Absolute: 0.1 10*3/uL (ref 0.0–0.1)
EOS%: 0.4 % (ref 0.0–7.0)
Eosinophils Absolute: 0 10*3/uL (ref 0.0–0.5)
HCT: 38.3 % (ref 34.8–46.6)
HGB: 12.4 g/dL (ref 11.6–15.9)
LYMPH%: 13.2 % — ABNORMAL LOW (ref 14.0–49.7)
MCH: 29.1 pg (ref 25.1–34.0)
MCHC: 32.4 g/dL (ref 31.5–36.0)
MCV: 89.6 fL (ref 79.5–101.0)
MONO#: 2.2 10*3/uL — AB (ref 0.1–0.9)
MONO%: 28.1 % — ABNORMAL HIGH (ref 0.0–14.0)
NEUT%: 57.6 % (ref 38.4–76.8)
NEUTROS ABS: 4.6 10*3/uL (ref 1.5–6.5)
Platelets: 130 10*3/uL — ABNORMAL LOW (ref 145–400)
RBC: 4.27 10*6/uL (ref 3.70–5.45)
RDW: 16.9 % — ABNORMAL HIGH (ref 11.2–14.5)
WBC: 7.9 10*3/uL (ref 3.9–10.3)
lymph#: 1 10*3/uL (ref 0.9–3.3)

## 2014-06-20 LAB — COMPREHENSIVE METABOLIC PANEL (CC13)
ALBUMIN: 3.9 g/dL (ref 3.5–5.0)
ALK PHOS: 72 U/L (ref 40–150)
ALT: 16 U/L (ref 0–55)
AST: 22 U/L (ref 5–34)
Anion Gap: 11 mEq/L (ref 3–11)
BUN: 14.2 mg/dL (ref 7.0–26.0)
CALCIUM: 9.5 mg/dL (ref 8.4–10.4)
CHLORIDE: 102 meq/L (ref 98–109)
CO2: 27 mEq/L (ref 22–29)
Creatinine: 0.8 mg/dL (ref 0.6–1.1)
Glucose: 88 mg/dl (ref 70–140)
Potassium: 3.4 mEq/L — ABNORMAL LOW (ref 3.5–5.1)
SODIUM: 141 meq/L (ref 136–145)
TOTAL PROTEIN: 6.9 g/dL (ref 6.4–8.3)
Total Bilirubin: 1.11 mg/dL (ref 0.20–1.20)

## 2014-06-20 MED ORDER — IOHEXOL 300 MG/ML  SOLN
100.0000 mL | Freq: Once | INTRAMUSCULAR | Status: AC | PRN
  Administered 2014-06-20: 100 mL via INTRAVENOUS

## 2014-06-22 ENCOUNTER — Encounter: Payer: Self-pay | Admitting: Internal Medicine

## 2014-06-22 ENCOUNTER — Other Ambulatory Visit: Payer: Self-pay | Admitting: *Deleted

## 2014-06-22 DIAGNOSIS — C3490 Malignant neoplasm of unspecified part of unspecified bronchus or lung: Secondary | ICD-10-CM

## 2014-06-22 MED ORDER — DOXYCYCLINE HYCLATE 100 MG PO TABS
100.0000 mg | ORAL_TABLET | Freq: Two times a day (BID) | ORAL | Status: DC
Start: 2014-06-22 — End: 2014-07-27

## 2014-06-22 NOTE — Telephone Encounter (Signed)
Patient portal message received reporting "eyes affected by Tarceva along with face and scalp broken out, dry and itching".  Verbal order received and read back from Dr. Julien Nordmann for Doxycycline 100 mg twice daily x 7 days.  If eyes are a problem se eye doctor.  Will notify patient via portal.

## 2014-06-22 NOTE — Telephone Encounter (Signed)
Printed for provider review.

## 2014-06-27 ENCOUNTER — Ambulatory Visit (HOSPITAL_BASED_OUTPATIENT_CLINIC_OR_DEPARTMENT_OTHER): Payer: PRIVATE HEALTH INSURANCE | Admitting: Internal Medicine

## 2014-06-27 ENCOUNTER — Telehealth: Payer: Self-pay | Admitting: Internal Medicine

## 2014-06-27 ENCOUNTER — Encounter: Payer: Self-pay | Admitting: Internal Medicine

## 2014-06-27 VITALS — BP 131/82 | HR 99 | Temp 98.6°F | Resp 18 | Ht 66.0 in | Wt 162.4 lb

## 2014-06-27 DIAGNOSIS — R911 Solitary pulmonary nodule: Secondary | ICD-10-CM

## 2014-06-27 DIAGNOSIS — C342 Malignant neoplasm of middle lobe, bronchus or lung: Secondary | ICD-10-CM

## 2014-06-27 DIAGNOSIS — R21 Rash and other nonspecific skin eruption: Secondary | ICD-10-CM

## 2014-06-27 DIAGNOSIS — C3410 Malignant neoplasm of upper lobe, unspecified bronchus or lung: Secondary | ICD-10-CM

## 2014-06-27 NOTE — Telephone Encounter (Signed)
, °
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
## 2014-06-28 ENCOUNTER — Encounter: Payer: Self-pay | Admitting: Internal Medicine

## 2014-07-01 ENCOUNTER — Encounter: Payer: Self-pay | Admitting: Internal Medicine

## 2014-07-07 ENCOUNTER — Encounter: Payer: Self-pay | Admitting: Internal Medicine

## 2014-07-07 NOTE — Telephone Encounter (Signed)
Printed for provider Dr. Julien Nordmann to review.

## 2014-07-20 ENCOUNTER — Other Ambulatory Visit: Payer: Self-pay | Admitting: *Deleted

## 2014-07-20 ENCOUNTER — Encounter: Payer: Self-pay | Admitting: Internal Medicine

## 2014-07-20 DIAGNOSIS — C3400 Malignant neoplasm of unspecified main bronchus: Secondary | ICD-10-CM

## 2014-07-20 MED ORDER — ERLOTINIB HCL 100 MG PO TABS: ORAL_TABLET | ORAL | Status: DC

## 2014-07-20 NOTE — Telephone Encounter (Signed)
MyChart message via patient portal reads: "Dr. Earlie Server, Please send prescription for reduced dosage 100mg  Tarceva. Thank you, Tracy Dodson 03-07-1951"  06-27-2014 office note per Dr. Julien Nordmann reads to fill next Tarceva refill at lower dose of 100 mg.

## 2014-07-27 ENCOUNTER — Encounter: Payer: Self-pay | Admitting: Internal Medicine

## 2014-07-27 ENCOUNTER — Ambulatory Visit (HOSPITAL_BASED_OUTPATIENT_CLINIC_OR_DEPARTMENT_OTHER): Payer: PRIVATE HEALTH INSURANCE | Admitting: Internal Medicine

## 2014-07-27 ENCOUNTER — Encounter: Payer: Self-pay | Admitting: Cardiothoracic Surgery

## 2014-07-27 ENCOUNTER — Other Ambulatory Visit (HOSPITAL_BASED_OUTPATIENT_CLINIC_OR_DEPARTMENT_OTHER): Payer: PRIVATE HEALTH INSURANCE

## 2014-07-27 ENCOUNTER — Telehealth: Payer: Self-pay | Admitting: Internal Medicine

## 2014-07-27 ENCOUNTER — Other Ambulatory Visit: Payer: Self-pay | Admitting: Internal Medicine

## 2014-07-27 VITALS — BP 162/83 | HR 96 | Temp 98.3°F | Resp 18 | Ht 66.0 in | Wt 159.2 lb

## 2014-07-27 DIAGNOSIS — C3410 Malignant neoplasm of upper lobe, unspecified bronchus or lung: Secondary | ICD-10-CM

## 2014-07-27 DIAGNOSIS — C342 Malignant neoplasm of middle lobe, bronchus or lung: Secondary | ICD-10-CM

## 2014-07-27 DIAGNOSIS — R21 Rash and other nonspecific skin eruption: Secondary | ICD-10-CM

## 2014-07-27 LAB — CBC WITH DIFFERENTIAL/PLATELET
BASO%: 0.6 % (ref 0.0–2.0)
Basophils Absolute: 0 10*3/uL (ref 0.0–0.1)
EOS%: 0.5 % (ref 0.0–7.0)
Eosinophils Absolute: 0 10*3/uL (ref 0.0–0.5)
HCT: 39.8 % (ref 34.8–46.6)
HGB: 12.8 g/dL (ref 11.6–15.9)
LYMPH%: 14 % (ref 14.0–49.7)
MCH: 29.5 pg (ref 25.1–34.0)
MCHC: 32.1 g/dL (ref 31.5–36.0)
MCV: 91.9 fL (ref 79.5–101.0)
MONO#: 2 10*3/uL — ABNORMAL HIGH (ref 0.1–0.9)
MONO%: 27 % — ABNORMAL HIGH (ref 0.0–14.0)
NEUT#: 4.2 10*3/uL (ref 1.5–6.5)
NEUT%: 57.9 % (ref 38.4–76.8)
PLATELETS: 153 10*3/uL (ref 145–400)
RBC: 4.33 10*6/uL (ref 3.70–5.45)
RDW: 15.7 % — ABNORMAL HIGH (ref 11.2–14.5)
WBC: 7.3 10*3/uL (ref 3.9–10.3)
lymph#: 1 10*3/uL (ref 0.9–3.3)

## 2014-07-27 LAB — COMPREHENSIVE METABOLIC PANEL (CC13)
ALT: 21 U/L (ref 0–55)
ANION GAP: 9 meq/L (ref 3–11)
AST: 22 U/L (ref 5–34)
Albumin: 3.9 g/dL (ref 3.5–5.0)
Alkaline Phosphatase: 74 U/L (ref 40–150)
BILIRUBIN TOTAL: 0.88 mg/dL (ref 0.20–1.20)
BUN: 10.2 mg/dL (ref 7.0–26.0)
CO2: 29 meq/L (ref 22–29)
Calcium: 9.6 mg/dL (ref 8.4–10.4)
Chloride: 102 mEq/L (ref 98–109)
Creatinine: 0.8 mg/dL (ref 0.6–1.1)
Glucose: 98 mg/dl (ref 70–140)
Potassium: 3.3 mEq/L — ABNORMAL LOW (ref 3.5–5.1)
SODIUM: 140 meq/L (ref 136–145)
TOTAL PROTEIN: 6.6 g/dL (ref 6.4–8.3)

## 2014-07-27 MED ORDER — METHYLPREDNISOLONE (PAK) 4 MG PO TABS: ORAL_TABLET | ORAL | Status: DC

## 2014-07-27 MED ORDER — DOXYCYCLINE HYCLATE 100 MG PO TABS: 100.0000 mg | ORAL_TABLET | Freq: Two times a day (BID) | ORAL | Status: DC
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
## 2014-07-27 NOTE — Telephone Encounter (Signed)
gv pt appt schedule for nov

## 2014-07-28 ENCOUNTER — Telehealth: Payer: Self-pay | Admitting: *Deleted

## 2014-07-28 MED ORDER — DOXYCYCLINE HYCLATE 100 MG PO TABS: 100.0000 mg | ORAL_TABLET | Freq: Two times a day (BID) | ORAL | Status: DC

## 2014-07-28 MED ORDER — CLINDAMYCIN PHOSPHATE 1 % EX SOLN: Freq: Two times a day (BID) | CUTANEOUS | Status: DC

## 2014-07-28 MED ORDER — METHYLPREDNISOLONE (PAK) 4 MG PO TABS: ORAL_TABLET | ORAL | Status: DC

## 2014-07-28 NOTE — Telephone Encounter (Signed)
Refills originally sent to wrong pharmacy. Corrected to [facility].

## 2014-07-28 NOTE — Telephone Encounter (Signed)
Left patient a message that we have corrected her refill request to [facility]. [facility] notified to cancel refills sent by Dr Julien Nordmann 07/27/14

## 2014-08-08 ENCOUNTER — Encounter: Payer: Self-pay | Admitting: Internal Medicine

## 2014-08-24 ENCOUNTER — Telehealth: Payer: Self-pay | Admitting: Internal Medicine

## 2014-08-24 ENCOUNTER — Ambulatory Visit (HOSPITAL_BASED_OUTPATIENT_CLINIC_OR_DEPARTMENT_OTHER): Payer: PRIVATE HEALTH INSURANCE | Admitting: Internal Medicine

## 2014-08-24 ENCOUNTER — Encounter: Payer: Self-pay | Admitting: Internal Medicine

## 2014-08-24 ENCOUNTER — Other Ambulatory Visit (HOSPITAL_BASED_OUTPATIENT_CLINIC_OR_DEPARTMENT_OTHER): Payer: PRIVATE HEALTH INSURANCE

## 2014-08-24 VITALS — BP 165/100 | HR 65 | Temp 97.7°F | Resp 18 | Ht 66.0 in | Wt 162.3 lb

## 2014-08-24 DIAGNOSIS — C342 Malignant neoplasm of middle lobe, bronchus or lung: Secondary | ICD-10-CM

## 2014-08-24 DIAGNOSIS — R21 Rash and other nonspecific skin eruption: Secondary | ICD-10-CM

## 2014-08-24 LAB — COMPREHENSIVE METABOLIC PANEL (CC13)
ALT: 24 U/L (ref 0–55)
AST: 26 U/L (ref 5–34)
Albumin: 3.9 g/dL (ref 3.5–5.0)
Alkaline Phosphatase: 79 U/L (ref 40–150)
Anion Gap: 8 mEq/L (ref 3–11)
BUN: 14.2 mg/dL (ref 7.0–26.0)
CO2: 29 mEq/L (ref 22–29)
Calcium: 9.4 mg/dL (ref 8.4–10.4)
Chloride: 103 mEq/L (ref 98–109)
Creatinine: 0.8 mg/dL (ref 0.6–1.1)
Glucose: 95 mg/dl (ref 70–140)
Potassium: 3.8 mEq/L (ref 3.5–5.1)
Sodium: 141 mEq/L (ref 136–145)
Total Bilirubin: 0.72 mg/dL (ref 0.20–1.20)
Total Protein: 6.7 g/dL (ref 6.4–8.3)

## 2014-08-24 LAB — CBC WITH DIFFERENTIAL/PLATELET
BASO%: 0.5 % (ref 0.0–2.0)
Basophils Absolute: 0 10*3/uL (ref 0.0–0.1)
EOS%: 0.3 % (ref 0.0–7.0)
Eosinophils Absolute: 0 10*3/uL (ref 0.0–0.5)
HCT: 38.8 % (ref 34.8–46.6)
HGB: 12.4 g/dL (ref 11.6–15.9)
LYMPH%: 16.4 % (ref 14.0–49.7)
MCH: 30.1 pg (ref 25.1–34.0)
MCHC: 32 g/dL (ref 31.5–36.0)
MCV: 94.2 fL (ref 79.5–101.0)
MONO#: 1.4 10*3/uL — ABNORMAL HIGH (ref 0.1–0.9)
MONO%: 23.9 % — AB (ref 0.0–14.0)
NEUT#: 3.5 10*3/uL (ref 1.5–6.5)
NEUT%: 58.9 % (ref 38.4–76.8)
Platelets: 164 10*3/uL (ref 145–400)
RBC: 4.12 10*6/uL (ref 3.70–5.45)
RDW: 14.6 % — ABNORMAL HIGH (ref 11.2–14.5)
WBC: 6 10*3/uL (ref 3.9–10.3)
lymph#: 1 10*3/uL (ref 0.9–3.3)
nRBC: 0 % (ref 0–0)
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
## 2014-08-24 NOTE — Telephone Encounter (Signed)
Gave avs & cal for Dec. °

## 2014-08-25 ENCOUNTER — Encounter: Payer: Self-pay | Admitting: Internal Medicine

## 2014-08-26 ENCOUNTER — Other Ambulatory Visit: Payer: Self-pay | Admitting: *Deleted

## 2014-08-26 ENCOUNTER — Telehealth: Payer: Self-pay | Admitting: Internal Medicine

## 2014-08-26 NOTE — Telephone Encounter (Signed)
s.w. pt and advised on 12.21 lab moved b4 ct at 8am..Marland KitchenMarland KitchenMarland Kitchenpt ok and aware

## 2014-09-12 ENCOUNTER — Encounter: Payer: Self-pay | Admitting: Internal Medicine

## 2014-09-13 ENCOUNTER — Other Ambulatory Visit: Payer: Self-pay | Admitting: *Deleted

## 2014-09-13 DIAGNOSIS — C342 Malignant neoplasm of middle lobe, bronchus or lung: Secondary | ICD-10-CM

## 2014-09-13 MED ORDER — METHYLPREDNISOLONE 4 MG PO KIT: PACK | ORAL | Status: DC

## 2014-09-13 NOTE — Telephone Encounter (Signed)
MyChart request from patient that her skin is irritated and would like Prednisone authorized by Dr. Julien Nordmann.

## 2014-09-19 ENCOUNTER — Encounter (HOSPITAL_COMMUNITY): Payer: Self-pay

## 2014-09-19 ENCOUNTER — Ambulatory Visit (HOSPITAL_BASED_OUTPATIENT_CLINIC_OR_DEPARTMENT_OTHER): Payer: PRIVATE HEALTH INSURANCE | Admitting: Lab

## 2014-09-19 ENCOUNTER — Ambulatory Visit (HOSPITAL_COMMUNITY)
Admission: RE | Admit: 2014-09-19 | Discharge: 2014-09-19 | Disposition: A | Discharge status: Home / Self Care | Payer: PRIVATE HEALTH INSURANCE | Source: Ambulatory Visit | Attending: Internal Medicine | Admitting: Internal Medicine

## 2014-09-19 ENCOUNTER — Other Ambulatory Visit: Payer: PRIVATE HEALTH INSURANCE | Admitting: Lab

## 2014-09-19 DIAGNOSIS — Z9221 Personal history of antineoplastic chemotherapy: Secondary | ICD-10-CM | POA: Diagnosis not present

## 2014-09-19 DIAGNOSIS — C342 Malignant neoplasm of middle lobe, bronchus or lung: Secondary | ICD-10-CM

## 2014-09-19 DIAGNOSIS — Z923 Personal history of irradiation: Secondary | ICD-10-CM | POA: Diagnosis not present

## 2014-09-19 LAB — COMPREHENSIVE METABOLIC PANEL (CC13)
ALBUMIN: 4.1 g/dL (ref 3.5–5.0)
ALT: 27 U/L (ref 0–55)
AST: 26 U/L (ref 5–34)
Alkaline Phosphatase: 71 U/L (ref 40–150)
Anion Gap: 12 mEq/L — ABNORMAL HIGH (ref 3–11)
BUN: 14.5 mg/dL (ref 7.0–26.0)
CALCIUM: 9.6 mg/dL (ref 8.4–10.4)
CO2: 29 mEq/L (ref 22–29)
Chloride: 99 mEq/L (ref 98–109)
Creatinine: 0.8 mg/dL (ref 0.6–1.1)
EGFR: 83 mL/min/{1.73_m2} — ABNORMAL LOW (ref >90)
Glucose: 98 mg/dl (ref 70–140)
POTASSIUM: 3.4 meq/L — AB (ref 3.5–5.1)
Sodium: 140 mEq/L (ref 136–145)
TOTAL PROTEIN: 7 g/dL (ref 6.4–8.3)
Total Bilirubin: 0.78 mg/dL (ref 0.20–1.20)

## 2014-09-19 LAB — CBC WITH DIFFERENTIAL/PLATELET
BASO%: 0.1 % (ref 0.0–2.0)
Basophils Absolute: 0 10*3/uL (ref 0.0–0.1)
EOS%: 0 % (ref 0.0–7.0)
Eosinophils Absolute: 0 10*3/uL (ref 0.0–0.5)
HCT: 39.9 % (ref 34.8–46.6)
HGB: 12.8 g/dL (ref 11.6–15.9)
LYMPH%: 10.5 % — ABNORMAL LOW (ref 14.0–49.7)
MCH: 30.4 pg (ref 25.1–34.0)
MCHC: 32.1 g/dL (ref 31.5–36.0)
MCV: 94.8 fL (ref 79.5–101.0)
MONO#: 1.5 10*3/uL — AB (ref 0.1–0.9)
MONO%: 12.8 % (ref 0.0–14.0)
NEUT#: 9.1 10*3/uL — ABNORMAL HIGH (ref 1.5–6.5)
NEUT%: 76.6 % (ref 38.4–76.8)
NRBC: 0 % (ref 0–0)
Platelets: 168 10*3/uL (ref 145–400)
RBC: 4.21 10*6/uL (ref 3.70–5.45)
RDW: 14.6 % — AB (ref 11.2–14.5)
WBC: 11.9 10*3/uL — AB (ref 3.9–10.3)
lymph#: 1.3 10*3/uL (ref 0.9–3.3)

## 2014-09-19 MED ORDER — IOHEXOL 300 MG/ML  SOLN
80.0000 mL | Freq: Once | INTRAMUSCULAR | Status: AC | PRN
  Administered 2014-09-19: 100 mL via INTRAVENOUS

## 2014-09-26 ENCOUNTER — Ambulatory Visit (HOSPITAL_BASED_OUTPATIENT_CLINIC_OR_DEPARTMENT_OTHER): Payer: PRIVATE HEALTH INSURANCE | Admitting: Internal Medicine

## 2014-09-26 ENCOUNTER — Telehealth: Payer: Self-pay | Admitting: Internal Medicine

## 2014-09-26 ENCOUNTER — Encounter: Payer: Self-pay | Admitting: Internal Medicine

## 2014-09-26 VITALS — BP 148/84 | HR 90 | Temp 98.6°F | Resp 18 | Ht 66.0 in | Wt 156.5 lb

## 2014-09-26 DIAGNOSIS — C342 Malignant neoplasm of middle lobe, bronchus or lung: Secondary | ICD-10-CM

## 2014-09-26 DIAGNOSIS — R21 Rash and other nonspecific skin eruption: Secondary | ICD-10-CM

## 2014-09-26 NOTE — Telephone Encounter (Signed)
, °
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
## 2014-10-25 ENCOUNTER — Encounter: Payer: Self-pay | Admitting: Physician Assistant

## 2014-10-25 ENCOUNTER — Other Ambulatory Visit (HOSPITAL_BASED_OUTPATIENT_CLINIC_OR_DEPARTMENT_OTHER): Payer: PRIVATE HEALTH INSURANCE

## 2014-10-25 ENCOUNTER — Telehealth: Payer: Self-pay | Admitting: Internal Medicine

## 2014-10-25 ENCOUNTER — Ambulatory Visit (HOSPITAL_BASED_OUTPATIENT_CLINIC_OR_DEPARTMENT_OTHER): Payer: PRIVATE HEALTH INSURANCE | Admitting: Physician Assistant

## 2014-10-25 VITALS — BP 134/89 | HR 99 | Temp 98.5°F | Resp 18 | Ht 66.0 in | Wt 158.7 lb

## 2014-10-25 DIAGNOSIS — C342 Malignant neoplasm of middle lobe, bronchus or lung: Secondary | ICD-10-CM

## 2014-10-25 DIAGNOSIS — Z23 Encounter for immunization: Secondary | ICD-10-CM

## 2014-10-25 LAB — CBC WITH DIFFERENTIAL/PLATELET
BASO%: 0.2 % (ref 0.0–2.0)
Basophils Absolute: 0 10*3/uL (ref 0.0–0.1)
EOS%: 0.3 % (ref 0.0–7.0)
Eosinophils Absolute: 0 10*3/uL (ref 0.0–0.5)
HCT: 39.6 % (ref 34.8–46.6)
HEMOGLOBIN: 12.8 g/dL (ref 11.6–15.9)
LYMPH%: 9.5 % — ABNORMAL LOW (ref 14.0–49.7)
MCH: 30.4 pg (ref 25.1–34.0)
MCHC: 32.3 g/dL (ref 31.5–36.0)
MCV: 94.1 fL (ref 79.5–101.0)
MONO#: 2.9 10*3/uL — ABNORMAL HIGH (ref 0.1–0.9)
MONO%: 32.2 % — AB (ref 0.0–14.0)
NEUT%: 57.8 % (ref 38.4–76.8)
NEUTROS ABS: 5.2 10*3/uL (ref 1.5–6.5)
PLATELETS: 180 10*3/uL (ref 145–400)
RBC: 4.21 10*6/uL (ref 3.70–5.45)
RDW: 14.5 % (ref 11.2–14.5)
WBC: 9 10*3/uL (ref 3.9–10.3)
lymph#: 0.9 10*3/uL (ref 0.9–3.3)
nRBC: 0 % (ref 0–0)

## 2014-10-25 LAB — COMPREHENSIVE METABOLIC PANEL (CC13)
ALT: 40 U/L (ref 0–55)
ANION GAP: 11 meq/L (ref 3–11)
AST: 42 U/L — AB (ref 5–34)
Albumin: 3.9 g/dL (ref 3.5–5.0)
Alkaline Phosphatase: 93 U/L (ref 40–150)
BILIRUBIN TOTAL: 1.08 mg/dL (ref 0.20–1.20)
BUN: 11 mg/dL (ref 7.0–26.0)
CO2: 28 meq/L (ref 22–29)
Calcium: 9.1 mg/dL (ref 8.4–10.4)
Chloride: 101 mEq/L (ref 98–109)
Creatinine: 0.8 mg/dL (ref 0.6–1.1)
EGFR: 79 mL/min/{1.73_m2} — ABNORMAL LOW (ref >90)
GLUCOSE: 84 mg/dL (ref 70–140)
POTASSIUM: 3.4 meq/L — AB (ref 3.5–5.1)
Sodium: 140 mEq/L (ref 136–145)
Total Protein: 6.9 g/dL (ref 6.4–8.3)

## 2014-10-25 MED ORDER — INFLUENZA VAC SPLIT QUAD 0.5 ML IM SUSY
0.5000 mL | PREFILLED_SYRINGE | Freq: Once | INTRAMUSCULAR | Status: AC
  Administered 2014-10-25: 0.5 mL via INTRAMUSCULAR
  Filled 2014-10-25: qty 0.5
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
## 2014-10-25 NOTE — Patient Instructions (Addendum)

## 2014-10-25 NOTE — Telephone Encounter (Signed)
gv and printed appt sched and avs for pt for Feb

## 2014-11-22 ENCOUNTER — Ambulatory Visit (HOSPITAL_BASED_OUTPATIENT_CLINIC_OR_DEPARTMENT_OTHER): Payer: PRIVATE HEALTH INSURANCE | Admitting: Internal Medicine

## 2014-11-22 ENCOUNTER — Other Ambulatory Visit (HOSPITAL_BASED_OUTPATIENT_CLINIC_OR_DEPARTMENT_OTHER): Payer: PRIVATE HEALTH INSURANCE

## 2014-11-22 ENCOUNTER — Telehealth: Payer: Self-pay | Admitting: Internal Medicine

## 2014-11-22 ENCOUNTER — Encounter: Payer: Self-pay | Admitting: Internal Medicine

## 2014-11-22 VITALS — BP 144/77 | HR 80 | Temp 97.6°F | Resp 18 | Ht 66.0 in | Wt 158.4 lb

## 2014-11-22 DIAGNOSIS — R21 Rash and other nonspecific skin eruption: Secondary | ICD-10-CM

## 2014-11-22 DIAGNOSIS — C342 Malignant neoplasm of middle lobe, bronchus or lung: Secondary | ICD-10-CM

## 2014-11-22 LAB — CBC WITH DIFFERENTIAL/PLATELET
BASO%: 0.2 % (ref 0.0–2.0)
BASOS ABS: 0 10*3/uL (ref 0.0–0.1)
EOS%: 0.3 % (ref 0.0–7.0)
Eosinophils Absolute: 0 10*3/uL (ref 0.0–0.5)
HCT: 40 % (ref 34.8–46.6)
HGB: 12.9 g/dL (ref 11.6–15.9)
LYMPH%: 14.3 % (ref 14.0–49.7)
MCH: 30.4 pg (ref 25.1–34.0)
MCHC: 32.3 g/dL (ref 31.5–36.0)
MCV: 94.3 fL (ref 79.5–101.0)
MONO#: 1.7 10*3/uL — ABNORMAL HIGH (ref 0.1–0.9)
MONO%: 27.2 % — ABNORMAL HIGH (ref 0.0–14.0)
NEUT#: 3.5 10*3/uL (ref 1.5–6.5)
NEUT%: 58 % (ref 38.4–76.8)
Platelets: 141 10*3/uL — ABNORMAL LOW (ref 145–400)
RBC: 4.24 10*6/uL (ref 3.70–5.45)
RDW: 14.6 % — ABNORMAL HIGH (ref 11.2–14.5)
WBC: 6.1 10*3/uL (ref 3.9–10.3)
lymph#: 0.9 10*3/uL (ref 0.9–3.3)

## 2014-11-22 LAB — COMPREHENSIVE METABOLIC PANEL (CC13)
ALBUMIN: 3.9 g/dL (ref 3.5–5.0)
ALK PHOS: 81 U/L (ref 40–150)
ALT: 17 U/L (ref 0–55)
AST: 22 U/L (ref 5–34)
Anion Gap: 9 mEq/L (ref 3–11)
BUN: 11.2 mg/dL (ref 7.0–26.0)
CALCIUM: 9.2 mg/dL (ref 8.4–10.4)
CHLORIDE: 101 meq/L (ref 98–109)
CO2: 30 mEq/L — ABNORMAL HIGH (ref 22–29)
Creatinine: 0.8 mg/dL (ref 0.6–1.1)
EGFR: 84 mL/min/{1.73_m2} — ABNORMAL LOW (ref >90)
Glucose: 86 mg/dl (ref 70–140)
Potassium: 3.5 mEq/L (ref 3.5–5.1)
SODIUM: 140 meq/L (ref 136–145)
TOTAL PROTEIN: 6.6 g/dL (ref 6.4–8.3)
Total Bilirubin: 0.96 mg/dL (ref 0.20–1.20)
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
## 2014-11-22 NOTE — Telephone Encounter (Signed)
Pt confirmed labs/ov per 02/23 POF, gave pt AVS... KJ

## 2014-12-02 ENCOUNTER — Telehealth: Payer: Self-pay | Admitting: *Deleted

## 2014-12-02 ENCOUNTER — Encounter: Payer: Self-pay | Admitting: Internal Medicine

## 2014-12-02 ENCOUNTER — Other Ambulatory Visit: Payer: Self-pay | Admitting: Physician Assistant

## 2014-12-02 DIAGNOSIS — C342 Malignant neoplasm of middle lobe, bronchus or lung: Secondary | ICD-10-CM

## 2014-12-02 MED ORDER — DOXYCYCLINE HYCLATE 100 MG PO TABS: 100.0000 mg | ORAL_TABLET | Freq: Two times a day (BID) | ORAL | Status: DC

## 2014-12-02 MED ORDER — METHYLPREDNISOLONE 4 MG PO KIT: PACK | ORAL | Status: DC

## 2014-12-02 NOTE — Telephone Encounter (Signed)
Pt sent electronic message: skin rash to chest, back, scalp and face.  Called pt to discuss. Pt advised her eyes are "weepy/crusty".  Reviewed with Adrena,  PA VO for medrol dose pack and Doxycycline 100 BID x 10 days. Pt to stop Tarceva 100mg  over weekend, resume on Monday.  Pt advised she will pick up rx after work tonight. No further concerns at this time.

## 2014-12-19 ENCOUNTER — Ambulatory Visit (HOSPITAL_COMMUNITY)
Admission: RE | Admit: 2014-12-19 | Discharge: 2014-12-19 | Disposition: A | Discharge status: Home / Self Care | Payer: PRIVATE HEALTH INSURANCE | Source: Ambulatory Visit | Attending: Internal Medicine | Admitting: Internal Medicine

## 2014-12-19 ENCOUNTER — Encounter (HOSPITAL_COMMUNITY): Payer: Self-pay

## 2014-12-19 ENCOUNTER — Other Ambulatory Visit (HOSPITAL_BASED_OUTPATIENT_CLINIC_OR_DEPARTMENT_OTHER): Payer: PRIVATE HEALTH INSURANCE

## 2014-12-19 DIAGNOSIS — Z9221 Personal history of antineoplastic chemotherapy: Secondary | ICD-10-CM | POA: Insufficient documentation

## 2014-12-19 DIAGNOSIS — Z923 Personal history of irradiation: Secondary | ICD-10-CM | POA: Diagnosis not present

## 2014-12-19 DIAGNOSIS — C342 Malignant neoplasm of middle lobe, bronchus or lung: Secondary | ICD-10-CM

## 2014-12-19 LAB — CBC WITH DIFFERENTIAL/PLATELET
BASO%: 0.3 % (ref 0.0–2.0)
Basophils Absolute: 0 10*3/uL (ref 0.0–0.1)
EOS%: 0.2 % (ref 0.0–7.0)
Eosinophils Absolute: 0 10*3/uL (ref 0.0–0.5)
HCT: 39.7 % (ref 34.8–46.6)
HGB: 12.6 g/dL (ref 11.6–15.9)
LYMPH%: 7.6 % — AB (ref 14.0–49.7)
MCH: 29.3 pg (ref 25.1–34.0)
MCHC: 31.6 g/dL (ref 31.5–36.0)
MCV: 92.7 fL (ref 79.5–101.0)
MONO#: 2.8 10*3/uL — ABNORMAL HIGH (ref 0.1–0.9)
MONO%: 26.1 % — ABNORMAL HIGH (ref 0.0–14.0)
NEUT#: 7 10*3/uL — ABNORMAL HIGH (ref 1.5–6.5)
NEUT%: 65.8 % (ref 38.4–76.8)
PLATELETS: 159 10*3/uL (ref 145–400)
RBC: 4.29 10*6/uL (ref 3.70–5.45)
RDW: 15.1 % — ABNORMAL HIGH (ref 11.2–14.5)
WBC: 10.7 10*3/uL — ABNORMAL HIGH (ref 3.9–10.3)
lymph#: 0.8 10*3/uL — ABNORMAL LOW (ref 0.9–3.3)

## 2014-12-19 LAB — COMPREHENSIVE METABOLIC PANEL (CC13)
ALT: 26 U/L (ref 0–55)
AST: 28 U/L (ref 5–34)
Albumin: 4 g/dL (ref 3.5–5.0)
Alkaline Phosphatase: 77 U/L (ref 40–150)
Anion Gap: 10 mEq/L (ref 3–11)
BUN: 15.2 mg/dL (ref 7.0–26.0)
CO2: 27 mEq/L (ref 22–29)
Calcium: 9.3 mg/dL (ref 8.4–10.4)
Chloride: 101 mEq/L (ref 98–109)
Creatinine: 0.8 mg/dL (ref 0.6–1.1)
EGFR: 78 mL/min/{1.73_m2} — ABNORMAL LOW (ref >90)
GLUCOSE: 100 mg/dL (ref 70–140)
POTASSIUM: 3.5 meq/L (ref 3.5–5.1)
Sodium: 137 mEq/L (ref 136–145)
Total Bilirubin: 1.14 mg/dL (ref 0.20–1.20)
Total Protein: 6.8 g/dL (ref 6.4–8.3)

## 2014-12-19 MED ORDER — IOHEXOL 300 MG/ML  SOLN
80.0000 mL | Freq: Once | INTRAMUSCULAR | Status: AC | PRN
  Administered 2014-12-19: 80 mL via INTRAVENOUS

## 2014-12-21 ENCOUNTER — Encounter: Payer: Self-pay | Admitting: Internal Medicine

## 2014-12-21 ENCOUNTER — Telehealth: Payer: Self-pay

## 2014-12-21 ENCOUNTER — Encounter: Payer: Self-pay | Admitting: Medical Oncology

## 2014-12-21 ENCOUNTER — Telehealth: Payer: Self-pay | Admitting: Medical Oncology

## 2014-12-21 NOTE — Telephone Encounter (Signed)
Done

## 2014-12-21 NOTE — Telephone Encounter (Signed)
Rep from [facility] called to let us know she has attempted 4 times in last week to contact the patient about her next shipment of [place]. The pt will have to call [employer] at [phone] for next shipment to be sent.  lvm with pt. S/w diane and she has been e-mailing pt today. She e-mailed pt with information as I spoke with her.

## 2014-12-26 ENCOUNTER — Telehealth: Payer: Self-pay | Admitting: Internal Medicine

## 2014-12-26 ENCOUNTER — Ambulatory Visit (HOSPITAL_BASED_OUTPATIENT_CLINIC_OR_DEPARTMENT_OTHER): Payer: PRIVATE HEALTH INSURANCE | Admitting: Internal Medicine

## 2014-12-26 ENCOUNTER — Encounter: Payer: Self-pay | Admitting: Internal Medicine

## 2014-12-26 VITALS — BP 144/75 | HR 97 | Temp 98.4°F | Resp 19 | Ht 66.0 in | Wt 158.5 lb

## 2014-12-26 DIAGNOSIS — C342 Malignant neoplasm of middle lobe, bronchus or lung: Secondary | ICD-10-CM

## 2014-12-26 DIAGNOSIS — R21 Rash and other nonspecific skin eruption: Secondary | ICD-10-CM | POA: Diagnosis not present

## 2014-12-26 NOTE — Telephone Encounter (Signed)
Pt confirmed labs/ov per 03/28 POF, gave pt AVS and calendar.... KJ
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
## 2014-12-26 NOTE — Progress Notes (Signed)
Note faxed to Dr Trudie Reed

## 2015-01-24 ENCOUNTER — Encounter: Payer: Self-pay | Admitting: Internal Medicine

## 2015-01-24 ENCOUNTER — Telehealth: Payer: Self-pay | Admitting: Medical Oncology

## 2015-01-24 DIAGNOSIS — C342 Malignant neoplasm of middle lobe, bronchus or lung: Secondary | ICD-10-CM

## 2015-01-24 MED ORDER — METHYLPREDNISOLONE 4 MG PO TBPK: ORAL_TABLET | ORAL | Status: DC

## 2015-01-24 NOTE — Telephone Encounter (Signed)
Pt sent email requesting prednisone for drug rash. Dr Julien Nordmann ordered medrol dose pak

## 2015-01-25 ENCOUNTER — Other Ambulatory Visit (HOSPITAL_BASED_OUTPATIENT_CLINIC_OR_DEPARTMENT_OTHER): Payer: PRIVATE HEALTH INSURANCE

## 2015-01-25 ENCOUNTER — Telehealth: Payer: Self-pay | Admitting: Internal Medicine

## 2015-01-25 ENCOUNTER — Encounter: Payer: Self-pay | Admitting: Internal Medicine

## 2015-01-25 ENCOUNTER — Ambulatory Visit (HOSPITAL_BASED_OUTPATIENT_CLINIC_OR_DEPARTMENT_OTHER): Payer: PRIVATE HEALTH INSURANCE | Admitting: Internal Medicine

## 2015-01-25 VITALS — BP 139/81 | HR 92 | Temp 98.4°F | Resp 18 | Ht 66.0 in | Wt 156.8 lb

## 2015-01-25 DIAGNOSIS — C3431 Malignant neoplasm of lower lobe, right bronchus or lung: Secondary | ICD-10-CM | POA: Diagnosis not present

## 2015-01-25 DIAGNOSIS — C342 Malignant neoplasm of middle lobe, bronchus or lung: Secondary | ICD-10-CM

## 2015-01-25 LAB — CBC WITH DIFFERENTIAL/PLATELET
BASO%: 0.1 % (ref 0.0–2.0)
BASOS ABS: 0 10*3/uL (ref 0.0–0.1)
EOS%: 0.5 % (ref 0.0–7.0)
Eosinophils Absolute: 0 10*3/uL (ref 0.0–0.5)
HCT: 39.5 % (ref 34.8–46.6)
HGB: 12.9 g/dL (ref 11.6–15.9)
LYMPH#: 1 10*3/uL (ref 0.9–3.3)
LYMPH%: 13.4 % — AB (ref 14.0–49.7)
MCH: 30.7 pg (ref 25.1–34.0)
MCHC: 32.7 g/dL (ref 31.5–36.0)
MCV: 94 fL (ref 79.5–101.0)
MONO#: 2.5 10*3/uL — AB (ref 0.1–0.9)
MONO%: 32.9 % — ABNORMAL HIGH (ref 0.0–14.0)
NEUT#: 4.1 10*3/uL (ref 1.5–6.5)
NEUT%: 53.1 % (ref 38.4–76.8)
PLATELETS: 157 10*3/uL (ref 145–400)
RBC: 4.2 10*6/uL (ref 3.70–5.45)
RDW: 14.6 % — ABNORMAL HIGH (ref 11.2–14.5)
WBC: 7.6 10*3/uL (ref 3.9–10.3)

## 2015-01-25 LAB — COMPREHENSIVE METABOLIC PANEL (CC13)
ALK PHOS: 75 U/L (ref 40–150)
ALT: 17 U/L (ref 0–55)
AST: 25 U/L (ref 5–34)
Albumin: 3.9 g/dL (ref 3.5–5.0)
Anion Gap: 11 mEq/L (ref 3–11)
BILIRUBIN TOTAL: 1.18 mg/dL (ref 0.20–1.20)
BUN: 10.3 mg/dL (ref 7.0–26.0)
CO2: 28 mEq/L (ref 22–29)
CREATININE: 0.8 mg/dL (ref 0.6–1.1)
Calcium: 9.1 mg/dL (ref 8.4–10.4)
Chloride: 99 mEq/L (ref 98–109)
EGFR: 83 mL/min/{1.73_m2} — ABNORMAL LOW (ref >90)
Glucose: 81 mg/dl (ref 70–140)
Potassium: 3.3 mEq/L — ABNORMAL LOW (ref 3.5–5.1)
Sodium: 138 mEq/L (ref 136–145)
Total Protein: 6.8 g/dL (ref 6.4–8.3)

## 2015-01-25 NOTE — Telephone Encounter (Signed)
Gave avs & calenedar for May.
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
## 2015-02-22 ENCOUNTER — Telehealth: Payer: Self-pay | Admitting: Internal Medicine

## 2015-02-22 ENCOUNTER — Encounter: Payer: Self-pay | Admitting: Internal Medicine

## 2015-02-22 ENCOUNTER — Other Ambulatory Visit (HOSPITAL_BASED_OUTPATIENT_CLINIC_OR_DEPARTMENT_OTHER): Payer: PRIVATE HEALTH INSURANCE

## 2015-02-22 ENCOUNTER — Ambulatory Visit (HOSPITAL_BASED_OUTPATIENT_CLINIC_OR_DEPARTMENT_OTHER): Payer: PRIVATE HEALTH INSURANCE | Admitting: Internal Medicine

## 2015-02-22 VITALS — BP 157/88 | HR 79 | Temp 98.1°F | Resp 19 | Ht 66.0 in | Wt 158.0 lb

## 2015-02-22 DIAGNOSIS — C342 Malignant neoplasm of middle lobe, bronchus or lung: Secondary | ICD-10-CM

## 2015-02-22 DIAGNOSIS — R21 Rash and other nonspecific skin eruption: Secondary | ICD-10-CM | POA: Diagnosis not present

## 2015-02-22 DIAGNOSIS — R197 Diarrhea, unspecified: Secondary | ICD-10-CM | POA: Diagnosis not present

## 2015-02-22 LAB — COMPREHENSIVE METABOLIC PANEL (CC13)
ALBUMIN: 3.9 g/dL (ref 3.5–5.0)
ALK PHOS: 74 U/L (ref 40–150)
ALT: 16 U/L (ref 0–55)
AST: 23 U/L (ref 5–34)
Anion Gap: 11 mEq/L (ref 3–11)
BILIRUBIN TOTAL: 0.83 mg/dL (ref 0.20–1.20)
BUN: 13.6 mg/dL (ref 7.0–26.0)
CHLORIDE: 101 meq/L (ref 98–109)
CO2: 27 meq/L (ref 22–29)
Calcium: 8.5 mg/dL (ref 8.4–10.4)
Creatinine: 0.7 mg/dL (ref 0.6–1.1)
EGFR: 88 mL/min/{1.73_m2} — ABNORMAL LOW (ref >90)
GLUCOSE: 91 mg/dL (ref 70–140)
Potassium: 3.6 mEq/L (ref 3.5–5.1)
Sodium: 139 mEq/L (ref 136–145)
Total Protein: 6.6 g/dL (ref 6.4–8.3)

## 2015-02-22 LAB — CBC WITH DIFFERENTIAL/PLATELET
BASO%: 0.2 % (ref 0.0–2.0)
Basophils Absolute: 0 10*3/uL (ref 0.0–0.1)
EOS%: 0.3 % (ref 0.0–7.0)
Eosinophils Absolute: 0 10*3/uL (ref 0.0–0.5)
HCT: 37.6 % (ref 34.8–46.6)
HGB: 12.4 g/dL (ref 11.6–15.9)
LYMPH#: 0.9 10*3/uL (ref 0.9–3.3)
LYMPH%: 15.6 % (ref 14.0–49.7)
MCH: 30.7 pg (ref 25.1–34.0)
MCHC: 33 g/dL (ref 31.5–36.0)
MCV: 93.1 fL (ref 79.5–101.0)
MONO#: 1.2 10*3/uL — AB (ref 0.1–0.9)
MONO%: 20.3 % — ABNORMAL HIGH (ref 0.0–14.0)
NEUT#: 3.8 10*3/uL (ref 1.5–6.5)
NEUT%: 63.6 % (ref 38.4–76.8)
NRBC: 0 % (ref 0–0)
PLATELETS: 165 10*3/uL (ref 145–400)
RBC: 4.04 10*6/uL (ref 3.70–5.45)
RDW: 14.7 % — AB (ref 11.2–14.5)
WBC: 6 10*3/uL (ref 3.9–10.3)

## 2015-02-22 MED ORDER — METHYLPREDNISOLONE 4 MG PO TBPK: ORAL_TABLET | ORAL | Status: DC
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
## 2015-02-22 NOTE — Telephone Encounter (Signed)
per pof to sch pt appt-gave tp copy of sch

## 2015-02-22 NOTE — Telephone Encounter (Signed)
per pof to sch pt appt-gave pt copy of sch °

## 2015-03-21 ENCOUNTER — Ambulatory Visit (HOSPITAL_COMMUNITY)
Admission: RE | Admit: 2015-03-21 | Discharge: 2015-03-21 | Disposition: A | Discharge status: Home / Self Care | Payer: PRIVATE HEALTH INSURANCE | Source: Ambulatory Visit | Attending: Internal Medicine | Admitting: Internal Medicine

## 2015-03-21 ENCOUNTER — Other Ambulatory Visit (HOSPITAL_BASED_OUTPATIENT_CLINIC_OR_DEPARTMENT_OTHER): Payer: PRIVATE HEALTH INSURANCE

## 2015-03-21 ENCOUNTER — Other Ambulatory Visit: Payer: PRIVATE HEALTH INSURANCE

## 2015-03-21 ENCOUNTER — Encounter (HOSPITAL_COMMUNITY): Payer: Self-pay

## 2015-03-21 DIAGNOSIS — Z923 Personal history of irradiation: Secondary | ICD-10-CM | POA: Insufficient documentation

## 2015-03-21 DIAGNOSIS — C342 Malignant neoplasm of middle lobe, bronchus or lung: Secondary | ICD-10-CM

## 2015-03-21 DIAGNOSIS — Z9889 Other specified postprocedural states: Secondary | ICD-10-CM | POA: Insufficient documentation

## 2015-03-21 DIAGNOSIS — Z87891 Personal history of nicotine dependence: Secondary | ICD-10-CM | POA: Diagnosis not present

## 2015-03-21 LAB — CBC WITH DIFFERENTIAL/PLATELET
BASO%: 0.6 % (ref 0.0–2.0)
Basophils Absolute: 0 10*3/uL (ref 0.0–0.1)
EOS%: 0.5 % (ref 0.0–7.0)
Eosinophils Absolute: 0 10*3/uL (ref 0.0–0.5)
HCT: 39.9 % (ref 34.8–46.6)
HGB: 13.4 g/dL (ref 11.6–15.9)
LYMPH#: 1.2 10*3/uL (ref 0.9–3.3)
LYMPH%: 17.2 % (ref 14.0–49.7)
MCH: 30.9 pg (ref 25.1–34.0)
MCHC: 33.5 g/dL (ref 31.5–36.0)
MCV: 92.2 fL (ref 79.5–101.0)
MONO#: 1.6 10*3/uL — AB (ref 0.1–0.9)
MONO%: 23.1 % — ABNORMAL HIGH (ref 0.0–14.0)
NEUT#: 4 10*3/uL (ref 1.5–6.5)
NEUT%: 58.6 % (ref 38.4–76.8)
Platelets: 186 10*3/uL (ref 145–400)
RBC: 4.32 10*6/uL (ref 3.70–5.45)
RDW: 15.4 % — AB (ref 11.2–14.5)
WBC: 6.7 10*3/uL (ref 3.9–10.3)

## 2015-03-21 LAB — COMPREHENSIVE METABOLIC PANEL (CC13)
ALBUMIN: 4.1 g/dL (ref 3.5–5.0)
ALT: 20 U/L (ref 0–55)
AST: 24 U/L (ref 5–34)
Alkaline Phosphatase: 69 U/L (ref 40–150)
Anion Gap: 8 mEq/L (ref 3–11)
BILIRUBIN TOTAL: 0.94 mg/dL (ref 0.20–1.20)
BUN: 15 mg/dL (ref 7.0–26.0)
CO2: 30 mEq/L — ABNORMAL HIGH (ref 22–29)
Calcium: 9.2 mg/dL (ref 8.4–10.4)
Chloride: 101 mEq/L (ref 98–109)
Creatinine: 0.7 mg/dL (ref 0.6–1.1)
EGFR: 87 mL/min/{1.73_m2} — AB (ref >90)
GLUCOSE: 85 mg/dL (ref 70–140)
POTASSIUM: 3.5 meq/L (ref 3.5–5.1)
Sodium: 139 mEq/L (ref 136–145)
TOTAL PROTEIN: 6.9 g/dL (ref 6.4–8.3)

## 2015-03-21 LAB — TECHNOLOGIST REVIEW

## 2015-03-21 MED ORDER — IOHEXOL 300 MG/ML  SOLN
100.0000 mL | Freq: Once | INTRAMUSCULAR | Status: AC | PRN
  Administered 2015-03-21: 80 mL via INTRAVENOUS

## 2015-03-28 ENCOUNTER — Encounter: Payer: Self-pay | Admitting: Internal Medicine

## 2015-03-28 ENCOUNTER — Ambulatory Visit (HOSPITAL_BASED_OUTPATIENT_CLINIC_OR_DEPARTMENT_OTHER): Payer: PRIVATE HEALTH INSURANCE | Admitting: Internal Medicine

## 2015-03-28 VITALS — BP 151/86 | HR 87 | Temp 97.8°F | Resp 18 | Ht 66.0 in | Wt 156.8 lb

## 2015-03-28 DIAGNOSIS — C3431 Malignant neoplasm of lower lobe, right bronchus or lung: Secondary | ICD-10-CM | POA: Diagnosis not present

## 2015-03-28 DIAGNOSIS — C342 Malignant neoplasm of middle lobe, bronchus or lung: Secondary | ICD-10-CM | POA: Diagnosis not present

## 2015-03-28 DIAGNOSIS — R21 Rash and other nonspecific skin eruption: Secondary | ICD-10-CM
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
## 2015-05-18 ENCOUNTER — Encounter: Payer: Self-pay | Admitting: Physician Assistant

## 2015-05-19 ENCOUNTER — Telehealth: Payer: Self-pay | Admitting: Internal Medicine

## 2015-05-19 ENCOUNTER — Other Ambulatory Visit: Payer: Self-pay | Admitting: Medical Oncology

## 2015-05-19 NOTE — Telephone Encounter (Signed)
Lft msg for pt confirming labs/ov per 08/19 POF, mailed schedule to pt.[name] KJ

## 2015-05-26 ENCOUNTER — Other Ambulatory Visit (HOSPITAL_BASED_OUTPATIENT_CLINIC_OR_DEPARTMENT_OTHER): Payer: PRIVATE HEALTH INSURANCE

## 2015-05-26 ENCOUNTER — Encounter: Payer: Self-pay | Admitting: Physician Assistant

## 2015-05-26 ENCOUNTER — Telehealth: Payer: Self-pay | Admitting: Physician Assistant

## 2015-05-26 ENCOUNTER — Ambulatory Visit (HOSPITAL_BASED_OUTPATIENT_CLINIC_OR_DEPARTMENT_OTHER): Payer: PRIVATE HEALTH INSURANCE | Admitting: Physician Assistant

## 2015-05-26 VITALS — BP 146/86 | HR 87 | Temp 98.1°F | Resp 18 | Ht 66.0 in | Wt 153.6 lb

## 2015-05-26 DIAGNOSIS — C3431 Malignant neoplasm of lower lobe, right bronchus or lung: Secondary | ICD-10-CM | POA: Diagnosis not present

## 2015-05-26 DIAGNOSIS — C342 Malignant neoplasm of middle lobe, bronchus or lung: Secondary | ICD-10-CM

## 2015-05-26 LAB — COMPREHENSIVE METABOLIC PANEL (CC13)
ALT: 18 U/L (ref 0–55)
ANION GAP: 6 meq/L (ref 3–11)
AST: 23 U/L (ref 5–34)
Albumin: 3.9 g/dL (ref 3.5–5.0)
Alkaline Phosphatase: 77 U/L (ref 40–150)
BILIRUBIN TOTAL: 0.75 mg/dL (ref 0.20–1.20)
BUN: 13.1 mg/dL (ref 7.0–26.0)
CO2: 32 meq/L — AB (ref 22–29)
CREATININE: 0.8 mg/dL (ref 0.6–1.1)
Calcium: 9.3 mg/dL (ref 8.4–10.4)
Chloride: 102 mEq/L (ref 98–109)
EGFR: 84 mL/min/{1.73_m2} — ABNORMAL LOW (ref >90)
GLUCOSE: 82 mg/dL (ref 70–140)
Potassium: 4 mEq/L (ref 3.5–5.1)
SODIUM: 140 meq/L (ref 136–145)
TOTAL PROTEIN: 6.7 g/dL (ref 6.4–8.3)

## 2015-05-26 LAB — CBC WITH DIFFERENTIAL/PLATELET
BASO%: 0.3 % (ref 0.0–2.0)
Basophils Absolute: 0 10*3/uL (ref 0.0–0.1)
EOS%: 0.6 % (ref 0.0–7.0)
Eosinophils Absolute: 0.1 10*3/uL (ref 0.0–0.5)
HCT: 39.1 % (ref 34.8–46.6)
HGB: 12.9 g/dL (ref 11.6–15.9)
LYMPH#: 1.3 10*3/uL (ref 0.9–3.3)
LYMPH%: 14.4 % (ref 14.0–49.7)
MCH: 30.7 pg (ref 25.1–34.0)
MCHC: 33 g/dL (ref 31.5–36.0)
MCV: 92.8 fL (ref 79.5–101.0)
MONO#: 1.9 10*3/uL — AB (ref 0.1–0.9)
MONO%: 21.1 % — ABNORMAL HIGH (ref 0.0–14.0)
NEUT%: 63.6 % (ref 38.4–76.8)
NEUTROS ABS: 5.7 10*3/uL (ref 1.5–6.5)
PLATELETS: 183 10*3/uL (ref 145–400)
RBC: 4.21 10*6/uL (ref 3.70–5.45)
RDW: 14.6 % — ABNORMAL HIGH (ref 11.2–14.5)
WBC: 8.9 10*3/uL (ref 3.9–10.3)
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
## 2015-05-26 NOTE — Telephone Encounter (Signed)
Pt confirmed labs/ov per 08/26 POF, gave pt avs and calendar... KJ

## 2015-05-30 ENCOUNTER — Encounter: Payer: Self-pay | Admitting: Physician Assistant

## 2015-06-01 NOTE — Patient Instructions (Signed)
Continue Tarceva at the current dose Continue taking doxycycline as prescribed by your dermatologist Follow-up in one month

## 2015-06-23 ENCOUNTER — Encounter (HOSPITAL_COMMUNITY): Payer: Self-pay

## 2015-06-23 ENCOUNTER — Ambulatory Visit (HOSPITAL_COMMUNITY)
Admission: RE | Admit: 2015-06-23 | Discharge: 2015-06-23 | Disposition: A | Discharge status: Home / Self Care | Payer: PRIVATE HEALTH INSURANCE | Source: Ambulatory Visit | Attending: Physician Assistant | Admitting: Physician Assistant

## 2015-06-23 DIAGNOSIS — C342 Malignant neoplasm of middle lobe, bronchus or lung: Secondary | ICD-10-CM | POA: Diagnosis not present

## 2015-06-23 DIAGNOSIS — E279 Disorder of adrenal gland, unspecified: Secondary | ICD-10-CM | POA: Insufficient documentation

## 2015-06-23 DIAGNOSIS — I2510 Atherosclerotic heart disease of native coronary artery without angina pectoris: Secondary | ICD-10-CM | POA: Diagnosis not present

## 2015-06-23 DIAGNOSIS — K8020 Calculus of gallbladder without cholecystitis without obstruction: Secondary | ICD-10-CM | POA: Insufficient documentation

## 2015-06-23 DIAGNOSIS — K449 Diaphragmatic hernia without obstruction or gangrene: Secondary | ICD-10-CM | POA: Insufficient documentation

## 2015-06-23 DIAGNOSIS — R911 Solitary pulmonary nodule: Secondary | ICD-10-CM | POA: Insufficient documentation

## 2015-06-23 MED ORDER — IOHEXOL 300 MG/ML  SOLN
75.0000 mL | Freq: Once | INTRAMUSCULAR | Status: AC | PRN
  Administered 2015-06-23: 75 mL via INTRAVENOUS

## 2015-06-26 ENCOUNTER — Telehealth: Payer: Self-pay | Admitting: Internal Medicine

## 2015-06-26 ENCOUNTER — Other Ambulatory Visit (HOSPITAL_BASED_OUTPATIENT_CLINIC_OR_DEPARTMENT_OTHER): Payer: PRIVATE HEALTH INSURANCE

## 2015-06-26 ENCOUNTER — Encounter: Payer: Self-pay | Admitting: Internal Medicine

## 2015-06-26 ENCOUNTER — Ambulatory Visit (HOSPITAL_BASED_OUTPATIENT_CLINIC_OR_DEPARTMENT_OTHER): Payer: PRIVATE HEALTH INSURANCE | Admitting: Internal Medicine

## 2015-06-26 VITALS — BP 134/85 | HR 88 | Temp 97.9°F | Resp 16 | Ht 66.0 in | Wt 151.0 lb

## 2015-06-26 DIAGNOSIS — C342 Malignant neoplasm of middle lobe, bronchus or lung: Secondary | ICD-10-CM

## 2015-06-26 LAB — CBC WITH DIFFERENTIAL/PLATELET
BASO%: 0.1 % (ref 0.0–2.0)
BASOS ABS: 0 10*3/uL (ref 0.0–0.1)
EOS ABS: 0 10*3/uL (ref 0.0–0.5)
EOS%: 0.4 % (ref 0.0–7.0)
HEMATOCRIT: 39 % (ref 34.8–46.6)
HEMOGLOBIN: 12.7 g/dL (ref 11.6–15.9)
LYMPH#: 0.9 10*3/uL (ref 0.9–3.3)
LYMPH%: 13.1 % — ABNORMAL LOW (ref 14.0–49.7)
MCH: 30.9 pg (ref 25.1–34.0)
MCHC: 32.6 g/dL (ref 31.5–36.0)
MCV: 94.9 fL (ref 79.5–101.0)
MONO#: 2 10*3/uL — AB (ref 0.1–0.9)
MONO%: 28.5 % — ABNORMAL HIGH (ref 0.0–14.0)
NEUT#: 4.1 10*3/uL (ref 1.5–6.5)
NEUT%: 57.9 % (ref 38.4–76.8)
NRBC: 0 % (ref 0–0)
Platelets: 150 10*3/uL (ref 145–400)
RBC: 4.11 10*6/uL (ref 3.70–5.45)
RDW: 14 % (ref 11.2–14.5)
WBC: 7.1 10*3/uL (ref 3.9–10.3)

## 2015-06-26 LAB — COMPREHENSIVE METABOLIC PANEL (CC13)
ALBUMIN: 4.1 g/dL (ref 3.5–5.0)
ALK PHOS: 76 U/L (ref 40–150)
ALT: 20 U/L (ref 0–55)
AST: 24 U/L (ref 5–34)
Anion Gap: 9 mEq/L (ref 3–11)
BILIRUBIN TOTAL: 0.99 mg/dL (ref 0.20–1.20)
BUN: 12.7 mg/dL (ref 7.0–26.0)
CALCIUM: 9.3 mg/dL (ref 8.4–10.4)
CO2: 29 mEq/L (ref 22–29)
Chloride: 101 mEq/L (ref 98–109)
Creatinine: 0.8 mg/dL (ref 0.6–1.1)
EGFR: 81 mL/min/{1.73_m2} — AB (ref >90)
GLUCOSE: 91 mg/dL (ref 70–140)
Potassium: 3.3 mEq/L — ABNORMAL LOW (ref 3.5–5.1)
SODIUM: 140 meq/L (ref 136–145)
TOTAL PROTEIN: 6.8 g/dL (ref 6.4–8.3)

## 2015-06-26 MED ORDER — HYDROCODONE-HOMATROPINE 5-1.5 MG/5ML PO SYRP: 5.0000 mL | ORAL_SOLUTION | Freq: Four times a day (QID) | ORAL | Status: DC | PRN

## 2015-06-26 NOTE — Telephone Encounter (Signed)
Gave adn printed appt sched and avs for pt for OCT °
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
## 2015-07-21 ENCOUNTER — Other Ambulatory Visit: Payer: Self-pay | Admitting: Internal Medicine

## 2015-07-21 DIAGNOSIS — C342 Malignant neoplasm of middle lobe, bronchus or lung: Secondary | ICD-10-CM

## 2015-07-21 MED ORDER — HYDROCODONE-HOMATROPINE 5-1.5 MG/5ML PO SYRP: 5.0000 mL | ORAL_SOLUTION | Freq: Four times a day (QID) | ORAL | Status: DC | PRN

## 2015-07-24 ENCOUNTER — Other Ambulatory Visit (HOSPITAL_BASED_OUTPATIENT_CLINIC_OR_DEPARTMENT_OTHER): Payer: PRIVATE HEALTH INSURANCE

## 2015-07-24 ENCOUNTER — Telehealth: Payer: Self-pay | Admitting: Internal Medicine

## 2015-07-24 ENCOUNTER — Ambulatory Visit (HOSPITAL_BASED_OUTPATIENT_CLINIC_OR_DEPARTMENT_OTHER): Payer: PRIVATE HEALTH INSURANCE | Admitting: Internal Medicine

## 2015-07-24 ENCOUNTER — Encounter: Payer: Self-pay | Admitting: Internal Medicine

## 2015-07-24 VITALS — BP 149/82 | HR 82 | Temp 97.8°F | Resp 18 | Ht 66.0 in | Wt 152.2 lb

## 2015-07-24 DIAGNOSIS — C342 Malignant neoplasm of middle lobe, bronchus or lung: Secondary | ICD-10-CM

## 2015-07-24 DIAGNOSIS — R05 Cough: Secondary | ICD-10-CM

## 2015-07-24 LAB — CBC WITH DIFFERENTIAL/PLATELET
BASO%: 0.4 % (ref 0.0–2.0)
Basophils Absolute: 0 10*3/uL (ref 0.0–0.1)
EOS%: 0.6 % (ref 0.0–7.0)
Eosinophils Absolute: 0 10*3/uL (ref 0.0–0.5)
HCT: 39.9 % (ref 34.8–46.6)
HGB: 13.2 g/dL (ref 11.6–15.9)
LYMPH#: 1 10*3/uL (ref 0.9–3.3)
LYMPH%: 19.1 % (ref 14.0–49.7)
MCH: 30.9 pg (ref 25.1–34.0)
MCHC: 33 g/dL (ref 31.5–36.0)
MCV: 93.7 fL (ref 79.5–101.0)
MONO#: 1.1 10*3/uL — AB (ref 0.1–0.9)
MONO%: 20.7 % — ABNORMAL HIGH (ref 0.0–14.0)
NEUT%: 59.2 % (ref 38.4–76.8)
NEUTROS ABS: 3.1 10*3/uL (ref 1.5–6.5)
PLATELETS: 140 10*3/uL — AB (ref 145–400)
RBC: 4.26 10*6/uL (ref 3.70–5.45)
RDW: 14.7 % — ABNORMAL HIGH (ref 11.2–14.5)
WBC: 5.3 10*3/uL (ref 3.9–10.3)

## 2015-07-24 LAB — COMPREHENSIVE METABOLIC PANEL (CC13)
ALT: 13 U/L (ref 0–55)
ANION GAP: 8 meq/L (ref 3–11)
AST: 21 U/L (ref 5–34)
Albumin: 4.2 g/dL (ref 3.5–5.0)
Alkaline Phosphatase: 71 U/L (ref 40–150)
BILIRUBIN TOTAL: 1.27 mg/dL — AB (ref 0.20–1.20)
BUN: 13.3 mg/dL (ref 7.0–26.0)
CO2: 29 meq/L (ref 22–29)
CREATININE: 0.8 mg/dL (ref 0.6–1.1)
Calcium: 9.6 mg/dL (ref 8.4–10.4)
Chloride: 102 mEq/L (ref 98–109)
EGFR: 83 mL/min/{1.73_m2} — ABNORMAL LOW (ref >90)
GLUCOSE: 89 mg/dL (ref 70–140)
Potassium: 3.7 mEq/L (ref 3.5–5.1)
Sodium: 139 mEq/L (ref 136–145)
TOTAL PROTEIN: 7 g/dL (ref 6.4–8.3)
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
## 2015-07-24 NOTE — Telephone Encounter (Signed)
Gave adn printd appt sched and avs for pt for NOV

## 2015-07-31 ENCOUNTER — Encounter: Payer: Self-pay | Admitting: Internal Medicine

## 2015-08-17 ENCOUNTER — Encounter: Payer: Self-pay | Admitting: Internal Medicine

## 2015-08-17 ENCOUNTER — Telehealth: Payer: Self-pay | Admitting: *Deleted

## 2015-08-17 DIAGNOSIS — C3400 Malignant neoplasm of unspecified main bronchus: Secondary | ICD-10-CM

## 2015-08-17 MED ORDER — ERLOTINIB HCL 100 MG PO TABS: ORAL_TABLET | ORAL | Status: DC

## 2015-08-17 NOTE — Telephone Encounter (Signed)
Rx for Tarceva called into [facility]. Spoke with Toula Moos, PharmMD with Verbal Order for Tarceva refill. Toula Moos advised they will send this overnight for pt.

## 2015-08-17 NOTE — Telephone Encounter (Signed)
[facility] called with urgent refill request for Tarceva.  Order can be called to [phone] or faxed to [phone], or eRx.  Request forwarded to [phone].

## 2015-08-21 ENCOUNTER — Encounter (HOSPITAL_COMMUNITY): Payer: Self-pay

## 2015-08-21 ENCOUNTER — Other Ambulatory Visit (HOSPITAL_BASED_OUTPATIENT_CLINIC_OR_DEPARTMENT_OTHER): Payer: PRIVATE HEALTH INSURANCE

## 2015-08-21 ENCOUNTER — Ambulatory Visit (HOSPITAL_COMMUNITY)
Admission: RE | Admit: 2015-08-21 | Discharge: 2015-08-21 | Disposition: A | Discharge status: Home / Self Care | Payer: PRIVATE HEALTH INSURANCE | Source: Ambulatory Visit | Attending: Internal Medicine | Admitting: Internal Medicine

## 2015-08-21 DIAGNOSIS — C342 Malignant neoplasm of middle lobe, bronchus or lung: Secondary | ICD-10-CM | POA: Insufficient documentation

## 2015-08-21 DIAGNOSIS — R911 Solitary pulmonary nodule: Secondary | ICD-10-CM | POA: Insufficient documentation

## 2015-08-21 LAB — CBC WITH DIFFERENTIAL/PLATELET
BASO%: 0.2 % (ref 0.0–2.0)
BASOS ABS: 0 10*3/uL (ref 0.0–0.1)
EOS%: 0.5 % (ref 0.0–7.0)
Eosinophils Absolute: 0 10*3/uL (ref 0.0–0.5)
HCT: 40.4 % (ref 34.8–46.6)
HGB: 13.1 g/dL (ref 11.6–15.9)
LYMPH#: 1.3 10*3/uL (ref 0.9–3.3)
LYMPH%: 19.5 % (ref 14.0–49.7)
MCH: 30.8 pg (ref 25.1–34.0)
MCHC: 32.4 g/dL (ref 31.5–36.0)
MCV: 95.1 fL (ref 79.5–101.0)
MONO#: 1.5 10*3/uL — ABNORMAL HIGH (ref 0.1–0.9)
MONO%: 22.1 % — AB (ref 0.0–14.0)
NEUT#: 3.8 10*3/uL (ref 1.5–6.5)
NEUT%: 57.7 % (ref 38.4–76.8)
Platelets: 156 10*3/uL (ref 145–400)
RBC: 4.25 10*6/uL (ref 3.70–5.45)
RDW: 14 % (ref 11.2–14.5)
WBC: 6.6 10*3/uL (ref 3.9–10.3)
nRBC: 0 % (ref 0–0)

## 2015-08-21 LAB — COMPREHENSIVE METABOLIC PANEL (CC13)
ALK PHOS: 66 U/L (ref 40–150)
ALT: 17 U/L (ref 0–55)
AST: 23 U/L (ref 5–34)
Albumin: 4.1 g/dL (ref 3.5–5.0)
Anion Gap: 10 mEq/L (ref 3–11)
BUN: 14.7 mg/dL (ref 7.0–26.0)
CHLORIDE: 101 meq/L (ref 98–109)
CO2: 30 meq/L — AB (ref 22–29)
Calcium: 9.7 mg/dL (ref 8.4–10.4)
Creatinine: 0.8 mg/dL (ref 0.6–1.1)
EGFR: 83 mL/min/{1.73_m2} — AB (ref >90)
GLUCOSE: 88 mg/dL (ref 70–140)
POTASSIUM: 3.8 meq/L (ref 3.5–5.1)
SODIUM: 140 meq/L (ref 136–145)
Total Bilirubin: 0.83 mg/dL (ref 0.20–1.20)
Total Protein: 6.6 g/dL (ref 6.4–8.3)

## 2015-08-21 MED ORDER — IOHEXOL 300 MG/ML  SOLN
75.0000 mL | Freq: Once | INTRAMUSCULAR | Status: AC | PRN
  Administered 2015-08-21: 75 mL via INTRAVENOUS

## 2015-08-23 ENCOUNTER — Telehealth: Payer: Self-pay | Admitting: *Deleted

## 2015-08-23 NOTE — Telephone Encounter (Signed)
FYI Call received from [place].  "There is some confusion about what medication Tracy Dodson needs to be taking.  Is this patient is to continue Tarceva or is the medication being changed?  The 08-17-2015 order has not been shipped because the patient said she doesn't need it, has some on hand, plans to see Dr. Julien Nordmann 08-28-2015 and will clarify orders."  Advised the October office note plan reads to continue therapy with her only side effect mild diarrhea and a verbal order was received by the nurse to refill on 08-17-2015.  Carley Hammed will contact patient with this information and may wait until next office visit for further clarification.

## 2015-08-23 NOTE — Telephone Encounter (Signed)
Opened in error, see addendum to 08-17-2015 note.

## 2015-08-28 ENCOUNTER — Telehealth: Payer: Self-pay | Admitting: Internal Medicine

## 2015-08-28 ENCOUNTER — Encounter: Payer: Self-pay | Admitting: Internal Medicine

## 2015-08-28 ENCOUNTER — Ambulatory Visit (HOSPITAL_BASED_OUTPATIENT_CLINIC_OR_DEPARTMENT_OTHER): Payer: PRIVATE HEALTH INSURANCE | Admitting: Internal Medicine

## 2015-08-28 VITALS — BP 131/69 | HR 93 | Temp 98.2°F | Resp 18 | Ht 66.0 in | Wt 153.4 lb

## 2015-08-28 DIAGNOSIS — C342 Malignant neoplasm of middle lobe, bronchus or lung: Secondary | ICD-10-CM

## 2015-08-28 DIAGNOSIS — C3491 Malignant neoplasm of unspecified part of right bronchus or lung: Secondary | ICD-10-CM | POA: Diagnosis not present

## 2015-08-28 DIAGNOSIS — R918 Other nonspecific abnormal finding of lung field: Secondary | ICD-10-CM

## 2015-08-28 NOTE — Telephone Encounter (Signed)
per pof to sch pt appt-gave pt copy of avs-adv [facility] will call to sch sca
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
## 2015-08-30 ENCOUNTER — Other Ambulatory Visit: Payer: Self-pay | Admitting: Nurse Practitioner

## 2015-09-01 ENCOUNTER — Other Ambulatory Visit: Payer: Self-pay | Admitting: *Deleted

## 2015-09-01 DIAGNOSIS — C342 Malignant neoplasm of middle lobe, bronchus or lung: Secondary | ICD-10-CM

## 2015-09-01 MED ORDER — HYDROCODONE-HOMATROPINE 5-1.5 MG/5ML PO SYRP: 5.0000 mL | ORAL_SOLUTION | Freq: Four times a day (QID) | ORAL | Status: DC | PRN

## 2015-09-06 ENCOUNTER — Telehealth: Payer: Self-pay | Admitting: Medical Oncology

## 2015-09-06 ENCOUNTER — Ambulatory Visit (HOSPITAL_COMMUNITY): Payer: PRIVATE HEALTH INSURANCE

## 2015-09-06 ENCOUNTER — Encounter: Payer: Self-pay | Admitting: Internal Medicine

## 2015-09-06 NOTE — Telephone Encounter (Signed)
Pt stated that her insurance cancelled her PET scan. I sent message to [place].

## 2015-09-07 ENCOUNTER — Telehealth: Payer: Self-pay | Admitting: Internal Medicine

## 2015-09-07 NOTE — Telephone Encounter (Signed)
returned call and s.w. pt and fwd to c.s. to see if auth recieved for PET scan

## 2015-09-08 ENCOUNTER — Telehealth: Payer: Self-pay | Admitting: Medical Oncology

## 2015-09-08 ENCOUNTER — Encounter: Payer: Self-pay | Admitting: Internal Medicine

## 2015-09-08 ENCOUNTER — Other Ambulatory Visit: Payer: Self-pay | Admitting: Medical Oncology

## 2015-09-08 DIAGNOSIS — C3400 Malignant neoplasm of unspecified main bronchus: Secondary | ICD-10-CM

## 2015-09-08 MED ORDER — ERLOTINIB HCL 100 MG PO TABS: ORAL_TABLET | ORAL | Status: DC

## 2015-09-08 NOTE — Telephone Encounter (Signed)
refill faxed

## 2015-09-12 ENCOUNTER — Other Ambulatory Visit: Payer: Self-pay | Admitting: Medical Oncology

## 2015-09-12 ENCOUNTER — Ambulatory Visit: Payer: PRIVATE HEALTH INSURANCE | Admitting: Internal Medicine

## 2015-09-12 ENCOUNTER — Telehealth: Payer: Self-pay | Admitting: Internal Medicine

## 2015-09-12 NOTE — Telephone Encounter (Signed)
lvm for pt regarding to 12.13 appt cx and moved to 12.22.Marland KitchenMarland KitchenMarland Kitchen

## 2015-09-18 ENCOUNTER — Telehealth: Payer: Self-pay | Admitting: Internal Medicine

## 2015-09-18 NOTE — Telephone Encounter (Signed)
Per MM moved 12/22 f/u from 1:15 pm to 10 am. Left message for patient re change and mailed schedule.

## 2015-09-19 ENCOUNTER — Ambulatory Visit (HOSPITAL_COMMUNITY)
Admission: RE | Admit: 2015-09-19 | Discharge: 2015-09-19 | Disposition: A | Discharge status: Home / Self Care | Payer: PRIVATE HEALTH INSURANCE | Source: Ambulatory Visit | Attending: Internal Medicine | Admitting: Internal Medicine

## 2015-09-19 DIAGNOSIS — C342 Malignant neoplasm of middle lobe, bronchus or lung: Secondary | ICD-10-CM | POA: Insufficient documentation

## 2015-09-19 DIAGNOSIS — K449 Diaphragmatic hernia without obstruction or gangrene: Secondary | ICD-10-CM | POA: Insufficient documentation

## 2015-09-19 DIAGNOSIS — K8020 Calculus of gallbladder without cholecystitis without obstruction: Secondary | ICD-10-CM | POA: Insufficient documentation

## 2015-09-19 DIAGNOSIS — I700 Atherosclerosis of aorta: Secondary | ICD-10-CM | POA: Diagnosis not present

## 2015-09-19 DIAGNOSIS — E279 Disorder of adrenal gland, unspecified: Secondary | ICD-10-CM | POA: Insufficient documentation

## 2015-09-19 DIAGNOSIS — R918 Other nonspecific abnormal finding of lung field: Secondary | ICD-10-CM | POA: Insufficient documentation

## 2015-09-19 LAB — GLUCOSE, CAPILLARY: Glucose-Capillary: 93 mg/dL (ref 65–99)

## 2015-09-19 MED ORDER — FLUDEOXYGLUCOSE F - 18 (FDG) INJECTION
7.3000 | Freq: Once | INTRAVENOUS | Status: AC | PRN
  Administered 2015-09-19: 7.3 via INTRAVENOUS

## 2015-09-21 ENCOUNTER — Encounter: Payer: Self-pay | Admitting: Internal Medicine

## 2015-09-21 ENCOUNTER — Other Ambulatory Visit: Payer: Self-pay | Admitting: Internal Medicine

## 2015-09-21 ENCOUNTER — Telehealth: Payer: Self-pay | Admitting: Internal Medicine

## 2015-09-21 ENCOUNTER — Ambulatory Visit (HOSPITAL_BASED_OUTPATIENT_CLINIC_OR_DEPARTMENT_OTHER): Payer: PRIVATE HEALTH INSURANCE | Admitting: Internal Medicine

## 2015-09-21 ENCOUNTER — Ambulatory Visit (HOSPITAL_BASED_OUTPATIENT_CLINIC_OR_DEPARTMENT_OTHER): Payer: PRIVATE HEALTH INSURANCE

## 2015-09-21 VITALS — BP 154/91 | HR 94 | Temp 97.8°F | Resp 18 | Ht 66.0 in | Wt 150.5 lb

## 2015-09-21 DIAGNOSIS — C342 Malignant neoplasm of middle lobe, bronchus or lung: Secondary | ICD-10-CM

## 2015-09-21 DIAGNOSIS — R21 Rash and other nonspecific skin eruption: Secondary | ICD-10-CM

## 2015-09-21 DIAGNOSIS — C3431 Malignant neoplasm of lower lobe, right bronchus or lung: Secondary | ICD-10-CM | POA: Diagnosis not present

## 2015-09-21 DIAGNOSIS — R05 Cough: Secondary | ICD-10-CM | POA: Diagnosis not present

## 2015-09-21 LAB — COMPREHENSIVE METABOLIC PANEL
ALBUMIN: 4.1 g/dL (ref 3.5–5.0)
ALT: 18 U/L (ref 0–55)
ANION GAP: 9 meq/L (ref 3–11)
AST: 24 U/L (ref 5–34)
Alkaline Phosphatase: 72 U/L (ref 40–150)
BILIRUBIN TOTAL: 1.08 mg/dL (ref 0.20–1.20)
BUN: 11.4 mg/dL (ref 7.0–26.0)
CO2: 30 meq/L — AB (ref 22–29)
CREATININE: 0.8 mg/dL (ref 0.6–1.1)
Calcium: 9.7 mg/dL (ref 8.4–10.4)
Chloride: 105 mEq/L (ref 98–109)
EGFR: 75 mL/min/{1.73_m2} — ABNORMAL LOW (ref >90)
Glucose: 87 mg/dl (ref 70–140)
Potassium: 3.6 mEq/L (ref 3.5–5.1)
Sodium: 144 mEq/L (ref 136–145)
TOTAL PROTEIN: 7.1 g/dL (ref 6.4–8.3)

## 2015-09-21 LAB — CBC WITH DIFFERENTIAL/PLATELET
BASO%: 0.2 % (ref 0.0–2.0)
Basophils Absolute: 0 10*3/uL (ref 0.0–0.1)
EOS%: 0.3 % (ref 0.0–7.0)
Eosinophils Absolute: 0 10*3/uL (ref 0.0–0.5)
HEMATOCRIT: 40.4 % (ref 34.8–46.6)
HEMOGLOBIN: 13.1 g/dL (ref 11.6–15.9)
LYMPH#: 1.1 10*3/uL (ref 0.9–3.3)
LYMPH%: 11.8 % — ABNORMAL LOW (ref 14.0–49.7)
MCH: 31.1 pg (ref 25.1–34.0)
MCHC: 32.4 g/dL (ref 31.5–36.0)
MCV: 96 fL (ref 79.5–101.0)
MONO#: 2.8 10*3/uL — ABNORMAL HIGH (ref 0.1–0.9)
MONO%: 29.8 % — ABNORMAL HIGH (ref 0.0–14.0)
NEUT%: 57.9 % (ref 38.4–76.8)
NEUTROS ABS: 5.4 10*3/uL (ref 1.5–6.5)
NRBC: 0 % (ref 0–0)
PLATELETS: 170 10*3/uL (ref 145–400)
RBC: 4.21 10*6/uL (ref 3.70–5.45)
RDW: 14.2 % (ref 11.2–14.5)
WBC: 9.3 10*3/uL (ref 3.9–10.3)

## 2015-09-21 LAB — TECHNOLOGIST REVIEW
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
## 2015-09-21 NOTE — Telephone Encounter (Signed)
PATIENT SENT BACK TO LAB AND GIVEN AVS REPORT AND APPOINTMENTS FOR January.

## 2015-09-22 ENCOUNTER — Ambulatory Visit: Payer: PRIVATE HEALTH INSURANCE

## 2015-09-23 ENCOUNTER — Other Ambulatory Visit: Payer: Self-pay | Admitting: Internal Medicine

## 2015-09-23 DIAGNOSIS — R21 Rash and other nonspecific skin eruption: Secondary | ICD-10-CM | POA: Insufficient documentation

## 2015-09-23 MED ORDER — METHYLPREDNISOLONE 4 MG PO TBPK: ORAL_TABLET | ORAL | Status: DC

## 2015-09-28 ENCOUNTER — Telehealth: Payer: Self-pay | Admitting: Internal Medicine

## 2015-09-28 ENCOUNTER — Other Ambulatory Visit: Payer: Self-pay | Admitting: Internal Medicine

## 2015-09-28 DIAGNOSIS — C342 Malignant neoplasm of middle lobe, bronchus or lung: Secondary | ICD-10-CM

## 2015-09-28 NOTE — Telephone Encounter (Signed)
Called and left a message with new appointment per pof

## 2015-10-05 ENCOUNTER — Ambulatory Visit: Payer: PRIVATE HEALTH INSURANCE | Admitting: Internal Medicine

## 2015-10-05 ENCOUNTER — Other Ambulatory Visit: Payer: PRIVATE HEALTH INSURANCE

## 2015-10-08 ENCOUNTER — Telehealth: Payer: Self-pay | Admitting: Internal Medicine

## 2015-10-08 NOTE — Telephone Encounter (Signed)
Per email from lab mgr cxd 1/19 lab at the request of Dr. Julien Nordmann. Left message for patient re 1/19 lab being cxd and she will have f/u w/MM only on 1/19  11 am.

## 2015-10-09 ENCOUNTER — Other Ambulatory Visit: Payer: Self-pay | Admitting: Radiology

## 2015-10-10 ENCOUNTER — Ambulatory Visit (HOSPITAL_COMMUNITY)
Admission: RE | Admit: 2015-10-10 | Discharge: 2015-10-10 | Disposition: A | Discharge status: Home / Self Care | Payer: PRIVATE HEALTH INSURANCE | Source: Ambulatory Visit | Attending: Internal Medicine | Admitting: Internal Medicine

## 2015-10-10 ENCOUNTER — Encounter (HOSPITAL_COMMUNITY): Payer: Self-pay

## 2015-10-10 ENCOUNTER — Ambulatory Visit (HOSPITAL_COMMUNITY)
Admission: RE | Admit: 2015-10-10 | Discharge: 2015-10-10 | Disposition: A | Discharge status: Home / Self Care | Payer: PRIVATE HEALTH INSURANCE | Source: Ambulatory Visit | Attending: Interventional Radiology | Admitting: Interventional Radiology

## 2015-10-10 DIAGNOSIS — C3431 Malignant neoplasm of lower lobe, right bronchus or lung: Secondary | ICD-10-CM | POA: Diagnosis not present

## 2015-10-10 DIAGNOSIS — R911 Solitary pulmonary nodule: Secondary | ICD-10-CM | POA: Insufficient documentation

## 2015-10-10 DIAGNOSIS — Z9889 Other specified postprocedural states: Secondary | ICD-10-CM | POA: Insufficient documentation

## 2015-10-10 DIAGNOSIS — C3400 Malignant neoplasm of unspecified main bronchus: Secondary | ICD-10-CM | POA: Diagnosis not present

## 2015-10-10 DIAGNOSIS — C342 Malignant neoplasm of middle lobe, bronchus or lung: Secondary | ICD-10-CM

## 2015-10-10 LAB — CBC
HEMATOCRIT: 42.7 % (ref 36.0–46.0)
HEMOGLOBIN: 13.6 g/dL (ref 12.0–15.0)
MCH: 30.8 pg (ref 26.0–34.0)
MCHC: 31.9 g/dL (ref 30.0–36.0)
MCV: 96.6 fL (ref 78.0–100.0)
Platelets: 164 10*3/uL (ref 150–400)
RBC: 4.42 MIL/uL (ref 3.87–5.11)
RDW: 14.2 % (ref 11.5–15.5)
WBC: 10 10*3/uL (ref 4.0–10.5)

## 2015-10-10 LAB — PROTIME-INR
INR: 0.99 (ref 0.00–1.49)
PROTHROMBIN TIME: 13.3 s (ref 11.6–15.2)

## 2015-10-10 LAB — APTT: aPTT: 27 seconds (ref 24–37)

## 2015-10-10 MED ORDER — FENTANYL CITRATE (PF) 100 MCG/2ML IJ SOLN
INTRAMUSCULAR | Status: AC | PRN
  Administered 2015-10-10 (×2): 25 ug via INTRAVENOUS

## 2015-10-10 MED ORDER — MIDAZOLAM HCL 2 MG/2ML IJ SOLN
INTRAMUSCULAR | Status: AC | PRN
  Administered 2015-10-10 (×2): 0.5 mg via INTRAVENOUS

## 2015-10-10 MED ORDER — LIDOCAINE-EPINEPHRINE 1 %-1:100000 IJ SOLN
INTRAMUSCULAR | Status: AC
  Filled 2015-10-10: qty 1

## 2015-10-10 MED ORDER — FENTANYL CITRATE (PF) 100 MCG/2ML IJ SOLN
INTRAMUSCULAR | Status: AC
  Filled 2015-10-10: qty 2

## 2015-10-10 MED ORDER — SODIUM CHLORIDE 0.9 % IV SOLN: Freq: Once | INTRAVENOUS | Status: DC

## 2015-10-10 MED ORDER — MIDAZOLAM HCL 2 MG/2ML IJ SOLN
INTRAMUSCULAR | Status: AC
  Filled 2015-10-10: qty 2

## 2015-10-10 NOTE — Sedation Documentation (Signed)
Patient denies pain and is resting comfortably.  
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
## 2015-10-10 NOTE — Procedures (Signed)
Technically successful CT guided biopsy of dominant right lower lobe pulmonary nodule.  No immediate post procedural complications.   Ronny Bacon, MD Pager #: [phone]

## 2015-10-10 NOTE — Discharge Instructions (Signed)

## 2015-10-13 ENCOUNTER — Telehealth: Payer: Self-pay | Admitting: *Deleted

## 2015-10-13 NOTE — Telephone Encounter (Signed)
Pt left message wondering if Dr Julien Nordmann has biopsy results yet... Follow up appt 10/19/15

## 2015-10-17 ENCOUNTER — Encounter (HOSPITAL_COMMUNITY): Payer: Self-pay

## 2015-10-18 ENCOUNTER — Telehealth: Payer: Self-pay | Admitting: Medical Oncology

## 2015-10-18 ENCOUNTER — Encounter (HOSPITAL_COMMUNITY): Payer: Self-pay

## 2015-10-18 NOTE — Telephone Encounter (Signed)
-----   Message from Curt Bears, MD sent at 10/17/2015  6:07 PM EST ----- Regarding: RE: bx results It is the same previous lung cancer but Molecular studies are still pending. ----- Message -----    From: Ardeen Garland, RN    Sent: 10/17/2015   4:15 PM      To: Curt Bears, MD Subject: bx results                                     appt Thursday - she called today for results.

## 2015-10-18 NOTE — Telephone Encounter (Signed)
I left message for pt to return my call.

## 2015-10-19 ENCOUNTER — Ambulatory Visit (HOSPITAL_BASED_OUTPATIENT_CLINIC_OR_DEPARTMENT_OTHER): Payer: PRIVATE HEALTH INSURANCE | Admitting: Internal Medicine

## 2015-10-19 ENCOUNTER — Encounter (HOSPITAL_COMMUNITY): Payer: Self-pay

## 2015-10-19 ENCOUNTER — Encounter: Payer: Self-pay | Admitting: Internal Medicine

## 2015-10-19 ENCOUNTER — Other Ambulatory Visit: Payer: Self-pay | Admitting: Medical Oncology

## 2015-10-19 ENCOUNTER — Other Ambulatory Visit: Payer: PRIVATE HEALTH INSURANCE

## 2015-10-19 ENCOUNTER — Telehealth: Payer: Self-pay | Admitting: Internal Medicine

## 2015-10-19 VITALS — BP 157/88 | HR 82 | Temp 97.9°F | Resp 18 | Ht 65.0 in | Wt 152.3 lb

## 2015-10-19 DIAGNOSIS — C342 Malignant neoplasm of middle lobe, bronchus or lung: Secondary | ICD-10-CM

## 2015-10-19 DIAGNOSIS — C7801 Secondary malignant neoplasm of right lung: Secondary | ICD-10-CM | POA: Diagnosis not present

## 2015-10-19 DIAGNOSIS — Z5111 Encounter for antineoplastic chemotherapy: Secondary | ICD-10-CM | POA: Insufficient documentation

## 2015-10-19 HISTORY — DX: Encounter for antineoplastic chemotherapy: Z51.11

## 2015-10-19 MED ORDER — HYDROCODONE-HOMATROPINE 5-1.5 MG/5ML PO SYRP: 5.0000 mL | ORAL_SOLUTION | Freq: Four times a day (QID) | ORAL | Status: DC | PRN

## 2015-10-19 MED ORDER — AFATINIB DIMALEATE 30 MG PO TABS: 30.0000 mg | ORAL_TABLET | Freq: Every day | ORAL | Status: DC

## 2015-10-19 NOTE — Telephone Encounter (Signed)
per pof to sch pt appt-gave pt copy of avs °

## 2015-10-19 NOTE — Progress Notes (Signed)
Hycodan rx given to pt. 
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
## 2015-10-24 ENCOUNTER — Telehealth: Payer: Self-pay | Admitting: *Deleted

## 2015-10-24 NOTE — Telephone Encounter (Signed)
Oncology Nurse Navigator Documentation  Oncology Nurse Navigator Flowsheets 10/24/2015  Navigator Encounter Type Telephone/I called and left Ms. Berquist a vm message.  I asked if she has gotten her Gilotrif or if she needs help with getting medication.  I asked that she call if needed.   Treatment Phase Treatment  Interventions Coordination of Care  Acuity Level 1  Time Spent with Patient 15

## 2015-10-25 NOTE — Addendum Note (Signed)
Addended by: Lucile Crater on: 10/25/2015 11:22 AM   Modules accepted: Medications

## 2015-10-25 NOTE — Telephone Encounter (Addendum)
Fax received from [facility], [facility] will need to fill Rx as [facility] has a limited supply. Called [facility], spoke with Care representative, pt's rx has been received, insurance will be verified and upon completion they will fill Rx.  Requested order be expedited.  No further concerns. Notified pt.

## 2015-10-25 NOTE — Telephone Encounter (Addendum)
"  I received a call yesterday asking how I am doing with the Gilotrif.  I haven't received it yet.  [facility] notified me Tracy Dodson is on limited distribution, gave me [facility]'s number to call as [facility] has sent the order to [facility]. I will call [facility] today.  I continue to use Tarceva.  Three day supply left.  Hoping to receive Gilotrif by then."

## 2015-10-27 ENCOUNTER — Encounter: Payer: Self-pay | Admitting: Internal Medicine

## 2015-10-27 ENCOUNTER — Telehealth: Payer: Self-pay | Admitting: *Deleted

## 2015-10-27 NOTE — Progress Notes (Signed)
Placed prior auth form for gilotrif on desk for dr. Julien Nordmann.

## 2015-10-27 NOTE — Telephone Encounter (Signed)
Call to pt pharmacy, PA# is needed prior to filling Gilotrif. Requested Form. Pharmacy faxed and information placed in Raquel's bin; managed care.

## 2015-10-31 ENCOUNTER — Encounter: Payer: Self-pay | Admitting: Internal Medicine

## 2015-10-31 NOTE — Progress Notes (Signed)
Per [facility] gilotrif approved 10/30/15-10/29/16 case# [id number]

## 2015-11-01 ENCOUNTER — Telehealth: Payer: Self-pay | Admitting: *Deleted

## 2015-11-01 NOTE — Telephone Encounter (Signed)
Oncology Nurse Navigator Documentation  Oncology Nurse Navigator Flowsheets 11/01/2015  Navigator Encounter Type Telephone/I called Tracy Dodson today to see about her medication.  She took her first does today.  She is doing well without complaints.   Telephone Outgoing Call  Treatment Phase Treatment  Barriers/Navigation Needs Education  Interventions Education Method  Education Method Verbal  Acuity Level 2  Time Spent with Patient 15

## 2015-11-01 NOTE — Telephone Encounter (Signed)
Oncology Nurse Navigator Documentation  Oncology Nurse Navigator Flowsheets 11/01/2015  Navigator Encounter Type Telephone/I called to follow up with patient to see if she had received her Gilotrif.  I left my name and phone number to call   Treatment Phase Treatment  Interventions Other  Acuity Level 1  Time Spent with Patient 15

## 2015-11-07 ENCOUNTER — Encounter: Payer: Self-pay | Admitting: Internal Medicine

## 2015-11-09 ENCOUNTER — Telehealth: Payer: Self-pay

## 2015-11-09 NOTE — Telephone Encounter (Signed)
Pt reports problems with stomach since starting Gilotif last week.  Let pt know it is ok for her to take a probiotic and she can increase the amount of immodium she is taking not to exceed 8/day.  Pt to return call to clinic if symptoms do not improve.  Pt voiced understanding.

## 2015-11-20 ENCOUNTER — Ambulatory Visit (HOSPITAL_BASED_OUTPATIENT_CLINIC_OR_DEPARTMENT_OTHER): Payer: PRIVATE HEALTH INSURANCE

## 2015-11-20 ENCOUNTER — Telehealth: Payer: Self-pay | Admitting: Internal Medicine

## 2015-11-20 ENCOUNTER — Encounter: Payer: Self-pay | Admitting: Internal Medicine

## 2015-11-20 ENCOUNTER — Ambulatory Visit (HOSPITAL_BASED_OUTPATIENT_CLINIC_OR_DEPARTMENT_OTHER): Payer: PRIVATE HEALTH INSURANCE | Admitting: Internal Medicine

## 2015-11-20 VITALS — BP 132/67 | HR 83 | Temp 98.1°F | Resp 18 | Ht 65.0 in | Wt 151.6 lb

## 2015-11-20 DIAGNOSIS — C3431 Malignant neoplasm of lower lobe, right bronchus or lung: Secondary | ICD-10-CM

## 2015-11-20 DIAGNOSIS — C342 Malignant neoplasm of middle lobe, bronchus or lung: Secondary | ICD-10-CM

## 2015-11-20 DIAGNOSIS — Z5111 Encounter for antineoplastic chemotherapy: Secondary | ICD-10-CM

## 2015-11-20 LAB — COMPREHENSIVE METABOLIC PANEL
ALT: 13 U/L (ref 0–55)
AST: 19 U/L (ref 5–34)
Albumin: 4.1 g/dL (ref 3.5–5.0)
Alkaline Phosphatase: 70 U/L (ref 40–150)
Anion Gap: 10 mEq/L (ref 3–11)
BILIRUBIN TOTAL: 0.37 mg/dL (ref 0.20–1.20)
BUN: 18 mg/dL (ref 7.0–26.0)
CHLORIDE: 102 meq/L (ref 98–109)
CO2: 27 meq/L (ref 22–29)
CREATININE: 0.9 mg/dL (ref 0.6–1.1)
Calcium: 9.1 mg/dL (ref 8.4–10.4)
EGFR: 72 mL/min/{1.73_m2} — ABNORMAL LOW (ref >90)
GLUCOSE: 89 mg/dL (ref 70–140)
Potassium: 3.5 mEq/L (ref 3.5–5.1)
SODIUM: 139 meq/L (ref 136–145)
TOTAL PROTEIN: 6.8 g/dL (ref 6.4–8.3)

## 2015-11-20 LAB — CBC WITH DIFFERENTIAL/PLATELET
BASO%: 0.1 % (ref 0.0–2.0)
Basophils Absolute: 0 10*3/uL (ref 0.0–0.1)
EOS ABS: 0 10*3/uL (ref 0.0–0.5)
EOS%: 0.4 % (ref 0.0–7.0)
HCT: 37.8 % (ref 34.8–46.6)
HGB: 12.4 g/dL (ref 11.6–15.9)
LYMPH%: 9.9 % — AB (ref 14.0–49.7)
MCH: 31.2 pg (ref 25.1–34.0)
MCHC: 32.8 g/dL (ref 31.5–36.0)
MCV: 95.2 fL (ref 79.5–101.0)
MONO#: 2.6 10*3/uL — ABNORMAL HIGH (ref 0.1–0.9)
MONO%: 28.6 % — AB (ref 0.0–14.0)
NEUT%: 61 % (ref 38.4–76.8)
NEUTROS ABS: 5.5 10*3/uL (ref 1.5–6.5)
NRBC: 0 % (ref 0–0)
PLATELETS: 165 10*3/uL (ref 145–400)
RBC: 3.97 10*6/uL (ref 3.70–5.45)
RDW: 14.2 % (ref 11.2–14.5)
WBC: 9 10*3/uL (ref 3.9–10.3)
lymph#: 0.9 10*3/uL (ref 0.9–3.3)

## 2015-11-20 LAB — TECHNOLOGIST REVIEW

## 2015-11-20 NOTE — Telephone Encounter (Signed)
Patient sent back to lab and given avs report and appointments for March. [facility] will call re ct - patient aware.

## 2015-11-20 NOTE — Telephone Encounter (Signed)
Gave patient avs report and appointments for March. [facility] will call patient re ct - patient aware.
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
## 2015-12-22 ENCOUNTER — Ambulatory Visit (HOSPITAL_COMMUNITY)
Admission: RE | Admit: 2015-12-22 | Discharge: 2015-12-22 | Disposition: A | Discharge status: Home / Self Care | Payer: PRIVATE HEALTH INSURANCE | Source: Ambulatory Visit | Attending: Internal Medicine | Admitting: Internal Medicine

## 2015-12-22 ENCOUNTER — Encounter (HOSPITAL_COMMUNITY): Payer: Self-pay

## 2015-12-22 ENCOUNTER — Other Ambulatory Visit (HOSPITAL_BASED_OUTPATIENT_CLINIC_OR_DEPARTMENT_OTHER): Payer: PRIVATE HEALTH INSURANCE

## 2015-12-22 DIAGNOSIS — C342 Malignant neoplasm of middle lobe, bronchus or lung: Secondary | ICD-10-CM | POA: Diagnosis not present

## 2015-12-22 DIAGNOSIS — Z5111 Encounter for antineoplastic chemotherapy: Secondary | ICD-10-CM

## 2015-12-22 LAB — CBC WITH DIFFERENTIAL/PLATELET
BASO%: 0.8 % (ref 0.0–2.0)
Basophils Absolute: 0.1 10*3/uL (ref 0.0–0.1)
EOS ABS: 0 10*3/uL (ref 0.0–0.5)
EOS%: 0.5 % (ref 0.0–7.0)
HCT: 36.6 % (ref 34.8–46.6)
HGB: 12 g/dL (ref 11.6–15.9)
LYMPH%: 14.1 % (ref 14.0–49.7)
MCH: 30.4 pg (ref 25.1–34.0)
MCHC: 32.7 g/dL (ref 31.5–36.0)
MCV: 92.9 fL (ref 79.5–101.0)
MONO#: 1.9 10*3/uL — ABNORMAL HIGH (ref 0.1–0.9)
MONO%: 28.6 % — AB (ref 0.0–14.0)
NEUT%: 56 % (ref 38.4–76.8)
NEUTROS ABS: 3.7 10*3/uL (ref 1.5–6.5)
PLATELETS: 158 10*3/uL (ref 145–400)
RBC: 3.94 10*6/uL (ref 3.70–5.45)
RDW: 14.9 % — ABNORMAL HIGH (ref 11.2–14.5)
WBC: 6.7 10*3/uL (ref 3.9–10.3)
lymph#: 0.9 10*3/uL (ref 0.9–3.3)

## 2015-12-22 LAB — COMPREHENSIVE METABOLIC PANEL
ALT: 26 U/L (ref 0–55)
ANION GAP: 10 meq/L (ref 3–11)
AST: 27 U/L (ref 5–34)
Albumin: 3.8 g/dL (ref 3.5–5.0)
Alkaline Phosphatase: 75 U/L (ref 40–150)
BILIRUBIN TOTAL: 0.45 mg/dL (ref 0.20–1.20)
BUN: 11.2 mg/dL (ref 7.0–26.0)
CO2: 28 meq/L (ref 22–29)
Calcium: 8.9 mg/dL (ref 8.4–10.4)
Chloride: 104 mEq/L (ref 98–109)
Creatinine: 0.8 mg/dL (ref 0.6–1.1)
EGFR: 78 mL/min/{1.73_m2} — AB (ref >90)
GLUCOSE: 89 mg/dL (ref 70–140)
Potassium: 4.2 mEq/L (ref 3.5–5.1)
SODIUM: 142 meq/L (ref 136–145)
TOTAL PROTEIN: 6.6 g/dL (ref 6.4–8.3)

## 2015-12-22 LAB — TECHNOLOGIST REVIEW

## 2015-12-22 MED ORDER — IOPAMIDOL (ISOVUE-300) INJECTION 61%
75.0000 mL | Freq: Once | INTRAVENOUS | Status: AC | PRN
  Administered 2015-12-22: 75 mL via INTRAVENOUS

## 2015-12-26 ENCOUNTER — Encounter: Payer: Self-pay | Admitting: *Deleted

## 2015-12-26 ENCOUNTER — Telehealth: Payer: Self-pay | Admitting: *Deleted

## 2015-12-26 ENCOUNTER — Encounter: Payer: Self-pay | Admitting: Internal Medicine

## 2015-12-26 ENCOUNTER — Ambulatory Visit (HOSPITAL_BASED_OUTPATIENT_CLINIC_OR_DEPARTMENT_OTHER): Payer: PRIVATE HEALTH INSURANCE | Admitting: Internal Medicine

## 2015-12-26 ENCOUNTER — Telehealth: Payer: Self-pay | Admitting: Internal Medicine

## 2015-12-26 ENCOUNTER — Ambulatory Visit (HOSPITAL_BASED_OUTPATIENT_CLINIC_OR_DEPARTMENT_OTHER): Payer: PRIVATE HEALTH INSURANCE

## 2015-12-26 VITALS — BP 134/73 | HR 105 | Temp 98.2°F | Resp 18 | Ht 65.0 in | Wt 150.2 lb

## 2015-12-26 DIAGNOSIS — C342 Malignant neoplasm of middle lobe, bronchus or lung: Secondary | ICD-10-CM

## 2015-12-26 DIAGNOSIS — C3431 Malignant neoplasm of lower lobe, right bronchus or lung: Secondary | ICD-10-CM

## 2015-12-26 MED ORDER — LIDOCAINE-PRILOCAINE 2.5-2.5 % EX CREA: 1.0000 "application " | TOPICAL_CREAM | CUTANEOUS | Status: DC | PRN

## 2015-12-26 MED ORDER — PROCHLORPERAZINE MALEATE 10 MG PO TABS: 10.0000 mg | ORAL_TABLET | Freq: Four times a day (QID) | ORAL | Status: DC | PRN

## 2015-12-26 MED ORDER — DEXAMETHASONE 4 MG PO TABS: ORAL_TABLET | ORAL | Status: DC

## 2015-12-26 MED ORDER — CYANOCOBALAMIN 1000 MCG/ML IJ SOLN
1000.0000 ug | Freq: Once | INTRAMUSCULAR | Status: AC
  Administered 2015-12-26: 1000 ug via INTRAMUSCULAR

## 2015-12-26 MED ORDER — FOLIC ACID 1 MG PO TABS: 1.0000 mg | ORAL_TABLET | Freq: Every day | ORAL | Status: DC

## 2015-12-26 NOTE — Telephone Encounter (Signed)
Per staff message and POF I have scheduled appts. Advised scheduler of appts. JMW  

## 2015-12-26 NOTE — Telephone Encounter (Signed)
per pof to sch pt appt-MW sch trmt-gave pt copy of avs

## 2015-12-26 NOTE — Progress Notes (Signed)
Oncology Nurse Navigator Documentation  Oncology Nurse Navigator Flowsheets 12/26/2015  Navigator Encounter Type Clinic/MDC  Treatment Phase Treatment  Barriers/Navigation Needs Education  Education Other  Interventions Education Method  Education Method Verbal  Acuity Level 2  Time Spent with Patient 30   Spoke with patient today at [facility].  She is having treatment change.  I updated her on her pre medications for chemo side effects.
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
## 2015-12-28 ENCOUNTER — Encounter: Payer: Self-pay | Admitting: Internal Medicine

## 2015-12-29 ENCOUNTER — Encounter: Payer: Self-pay | Admitting: Medical Oncology

## 2016-01-03 ENCOUNTER — Other Ambulatory Visit: Payer: Self-pay | Admitting: Medical Oncology

## 2016-01-03 DIAGNOSIS — C342 Malignant neoplasm of middle lobe, bronchus or lung: Secondary | ICD-10-CM

## 2016-01-03 MED ORDER — HYDROCODONE-HOMATROPINE 5-1.5 MG/5ML PO SYRP: 5.0000 mL | ORAL_SOLUTION | Freq: Four times a day (QID) | ORAL | Status: DC | PRN

## 2016-01-04 ENCOUNTER — Other Ambulatory Visit (HOSPITAL_BASED_OUTPATIENT_CLINIC_OR_DEPARTMENT_OTHER): Payer: PRIVATE HEALTH INSURANCE

## 2016-01-04 ENCOUNTER — Other Ambulatory Visit: Payer: Self-pay | Admitting: *Deleted

## 2016-01-04 ENCOUNTER — Telehealth: Payer: Self-pay | Admitting: *Deleted

## 2016-01-04 ENCOUNTER — Ambulatory Visit (HOSPITAL_BASED_OUTPATIENT_CLINIC_OR_DEPARTMENT_OTHER): Payer: PRIVATE HEALTH INSURANCE

## 2016-01-04 DIAGNOSIS — C342 Malignant neoplasm of middle lobe, bronchus or lung: Secondary | ICD-10-CM

## 2016-01-04 DIAGNOSIS — Z5111 Encounter for antineoplastic chemotherapy: Secondary | ICD-10-CM | POA: Diagnosis not present

## 2016-01-04 DIAGNOSIS — I878 Other specified disorders of veins: Secondary | ICD-10-CM

## 2016-01-04 DIAGNOSIS — E876 Hypokalemia: Secondary | ICD-10-CM

## 2016-01-04 LAB — CBC WITH DIFFERENTIAL/PLATELET
BASO%: 0.1 % (ref 0.0–2.0)
BASOS ABS: 0 10*3/uL (ref 0.0–0.1)
EOS ABS: 0 10*3/uL (ref 0.0–0.5)
EOS%: 0 % (ref 0.0–7.0)
HEMATOCRIT: 37.9 % (ref 34.8–46.6)
HEMOGLOBIN: 12.4 g/dL (ref 11.6–15.9)
LYMPH#: 0.8 10*3/uL — AB (ref 0.9–3.3)
LYMPH%: 4.3 % — ABNORMAL LOW (ref 14.0–49.7)
MCH: 30.6 pg (ref 25.1–34.0)
MCHC: 32.7 g/dL (ref 31.5–36.0)
MCV: 93.6 fL (ref 79.5–101.0)
MONO#: 1.3 10*3/uL — ABNORMAL HIGH (ref 0.1–0.9)
MONO%: 7.2 % (ref 0.0–14.0)
NEUT%: 88.4 % — ABNORMAL HIGH (ref 38.4–76.8)
NEUTROS ABS: 16 10*3/uL — AB (ref 1.5–6.5)
NRBC: 0 % (ref 0–0)
PLATELETS: 230 10*3/uL (ref 145–400)
RBC: 4.05 10*6/uL (ref 3.70–5.45)
RDW: 13.7 % (ref 11.2–14.5)
WBC: 18.1 10*3/uL — AB (ref 3.9–10.3)

## 2016-01-04 LAB — COMPREHENSIVE METABOLIC PANEL
ALK PHOS: 78 U/L (ref 40–150)
ALT: 17 U/L (ref 0–55)
ANION GAP: 13 meq/L — AB (ref 3–11)
AST: 17 U/L (ref 5–34)
Albumin: 4.1 g/dL (ref 3.5–5.0)
BILIRUBIN TOTAL: 0.5 mg/dL (ref 0.20–1.20)
BUN: 16.8 mg/dL (ref 7.0–26.0)
CALCIUM: 10 mg/dL (ref 8.4–10.4)
CHLORIDE: 100 meq/L (ref 98–109)
CO2: 27 mEq/L (ref 22–29)
CREATININE: 0.9 mg/dL (ref 0.6–1.1)
EGFR: 70 mL/min/{1.73_m2} — ABNORMAL LOW (ref >90)
GLUCOSE: 178 mg/dL — AB (ref 70–140)
POTASSIUM: 3.1 meq/L — AB (ref 3.5–5.1)
Sodium: 140 mEq/L (ref 136–145)
TOTAL PROTEIN: 7.4 g/dL (ref 6.4–8.3)

## 2016-01-04 MED ORDER — SODIUM CHLORIDE 0.9 % IV SOLN
Freq: Once | INTRAVENOUS | Status: AC
  Administered 2016-01-04: 15:00:00 via INTRAVENOUS

## 2016-01-04 MED ORDER — PALONOSETRON HCL INJECTION 0.25 MG/5ML
0.2500 mg | Freq: Once | INTRAVENOUS | Status: AC
  Administered 2016-01-04: 0.25 mg via INTRAVENOUS

## 2016-01-04 MED ORDER — POTASSIUM CHLORIDE CRYS ER 20 MEQ PO TBCR: 20.0000 meq | EXTENDED_RELEASE_TABLET | Freq: Once | ORAL | Status: DC

## 2016-01-04 MED ORDER — SODIUM CHLORIDE 0.9 % IV SOLN
502.0000 mg/m2 | Freq: Once | INTRAVENOUS | Status: AC
  Administered 2016-01-04: 900 mg via INTRAVENOUS
  Filled 2016-01-04: qty 32

## 2016-01-04 MED ORDER — SODIUM CHLORIDE 0.9 % IV SOLN
517.0000 mg | Freq: Once | INTRAVENOUS | Status: AC
  Administered 2016-01-04: 520 mg via INTRAVENOUS
  Filled 2016-01-04: qty 52

## 2016-01-04 MED ORDER — SODIUM CHLORIDE 0.9 % IV SOLN
10.0000 mg | Freq: Once | INTRAVENOUS | Status: AC
  Administered 2016-01-04: 10 mg via INTRAVENOUS
  Filled 2016-01-04: qty 1

## 2016-01-04 MED ORDER — PALONOSETRON HCL INJECTION 0.25 MG/5ML
INTRAVENOUS | Status: AC
  Filled 2016-01-04: qty 5

## 2016-01-04 NOTE — Patient Instructions (Signed)
[facility] Discharge Instructions for Patients Receiving Chemotherapy  Today you received the following chemotherapy agents: Alimta & Carboplatin.  To help prevent nausea and vomiting after your treatment, we encourage you to take your nausea medication as prescribed.  If you develop nausea and vomiting that is not controlled by your nausea medication, call the clinic.   BELOW ARE SYMPTOMS THAT SHOULD BE REPORTED IMMEDIATELY:  *FEVER GREATER THAN 100.5 F  *CHILLS WITH OR WITHOUT FEVER  NAUSEA AND VOMITING THAT IS NOT CONTROLLED WITH YOUR NAUSEA MEDICATION  *UNUSUAL SHORTNESS OF BREATH  *UNUSUAL BRUISING OR BLEEDING  TENDERNESS IN MOUTH AND THROAT WITH OR WITHOUT PRESENCE OF ULCERS  *URINARY PROBLEMS  *BOWEL PROBLEMS  UNUSUAL RASH Items with * indicate a potential emergency and should be followed up as soon as possible.  Feel free to call the clinic you have any questions or concerns. The clinic phone number is [phone].  Please show the [facility] at check-in to the Emergency Department and triage nurse.  Pemetrexed injection What is this medicine? PEMETREXED (PEM e TREX ed) is a chemotherapy drug. This medicine affects cells that are rapidly growing, such as cancer cells and cells in your mouth and stomach. It is usually used to treat lung cancers like non-small cell lung cancer and mesothelioma. It may also be used to treat other cancers. This medicine may be used for other purposes; ask your health care provider or pharmacist if you have questions. What should I tell my health care provider before I take this medicine? They need to know if you have any of these conditions: -if you frequently drink alcohol containing beverages -infection (especially a virus infection such as chickenpox, cold sores, or herpes) -kidney disease -liver disease -low blood counts, like low platelets, red bloods, or white blood cells -an unusual or allergic reaction  to pemetrexed, mannitol, other medicines, foods, dyes, or preservatives -pregnant or trying to get pregnant -breast-feeding How should I use this medicine? This drug is given as an infusion into a vein. It is administered in a hospital or clinic by a specially trained health care professional. Talk to your pediatrician regarding the use of this medicine in children. Special care may be needed. Overdosage: If you think you have taken too much of this medicine contact a poison control center or emergency room at once. NOTE: This medicine is only for you. Do not share this medicine with others. What if I miss a dose? It is important not to miss your dose. Call your doctor or health care professional if you are unable to keep an appointment. What may interact with this medicine? -aspirin and aspirin-like medicines -medicines to increase blood counts like filgrastim, pegfilgrastim, sargramostim -methotrexate -NSAIDS, medicines for pain and inflammation, like ibuprofen or naproxen -probenecid -pyrimethamine -vaccines Talk to your doctor or health care professional before taking any of these medicines: -acetaminophen -aspirin -ibuprofen -ketoprofen -naproxen This list may not describe all possible interactions. Give your health care provider a list of all the medicines, herbs, non-prescription drugs, or dietary supplements you use. Also tell them if you smoke, drink alcohol, or use illegal drugs. Some items may interact with your medicine. What should I watch for while using this medicine? Visit your doctor for checks on your progress. This drug may make you feel generally unwell. This is not uncommon, as chemotherapy can affect healthy cells as well as cancer cells. Report any side effects. Continue your course of treatment even though you feel ill  unless your doctor tells you to stop. In some cases, you may be given additional medicines to help with side effects. Follow all directions for their  use. Call your doctor or health care professional for advice if you get a fever, chills or sore throat, or other symptoms of a cold or flu. Do not treat yourself. This drug decreases your body's ability to fight infections. Try to avoid being around people who are sick. This medicine may increase your risk to bruise or bleed. Call your doctor or health care professional if you notice any unusual bleeding. Be careful brushing and flossing your teeth or using a toothpick because you may get an infection or bleed more easily. If you have any dental work done, tell your dentist you are receiving this medicine. Avoid taking products that contain aspirin, acetaminophen, ibuprofen, naproxen, or ketoprofen unless instructed by your doctor. These medicines may hide a fever. Call your doctor or health care professional if you get diarrhea or mouth sores. Do not treat yourself. To protect your kidneys, drink water or other fluids as directed while you are taking this medicine. Men and women must use effective birth control while taking this medicine. You may also need to continue using effective birth control for a time after stopping this medicine. Do not become pregnant while taking this medicine. Tell your doctor right away if you think that you or your partner might be pregnant. There is a potential for serious side effects to an unborn child. Talk to your health care professional or pharmacist for more information. Do not breast-feed an infant while taking this medicine. This medicine may lower sperm counts. What side effects may I notice from receiving this medicine? Side effects that you should report to your doctor or health care professional as soon as possible: -allergic reactions like skin rash, itching or hives, swelling of the face, lips, or tongue -low blood counts - this medicine may decrease the number of white blood cells, red blood cells and platelets. You may be at increased risk for infections  and bleeding. -signs of infection - fever or chills, cough, sore throat, pain or difficulty passing urine -signs of decreased platelets or bleeding - bruising, pinpoint red spots on the skin, black, tarry stools, blood in the urine -signs of decreased red blood cells - unusually weak or tired, fainting spells, lightheadedness -breathing problems, like a dry cough -changes in emotions or moods -chest pain -confusion -diarrhea -high blood pressure -mouth or throat sores or ulcers -pain, swelling, warmth in the leg -pain on swallowing -swelling of the ankles, feet, hands -trouble passing urine or change in the amount of urine -vomiting -yellowing of the eyes or skin Side effects that usually do not require medical attention (report to your doctor or health care professional if they continue or are bothersome): -hair loss -loss of appetite -nausea -stomach upset This list may not describe all possible side effects. Call your doctor for medical advice about side effects. You may report side effects to FDA at 1-800-FDA-1088. Where should I keep my medicine? This drug is given in a hospital or clinic and will not be stored at home. NOTE: This sheet is a summary. It may not cover all possible information. If you have questions about this medicine, talk to your doctor, pharmacist, or health care provider.    2016, Elsevier/Gold Standard. (2008-04-19 13:24:03)  Carboplatin injection What is this medicine? CARBOPLATIN (KAR boe pla tin) is a chemotherapy drug. It targets fast dividing cells, like  cancer cells, and causes these cells to die. This medicine is used to treat ovarian cancer and many other cancers. This medicine may be used for other purposes; ask your health care provider or pharmacist if you have questions. What should I tell my health care provider before I take this medicine? They need to know if you have any of these conditions: -blood disorders -hearing problems -kidney  disease -recent or ongoing radiation therapy -an unusual or allergic reaction to carboplatin, cisplatin, other chemotherapy, other medicines, foods, dyes, or preservatives -pregnant or trying to get pregnant -breast-feeding How should I use this medicine? This drug is usually given as an infusion into a vein. It is administered in a hospital or clinic by a specially trained health care professional. Talk to your pediatrician regarding the use of this medicine in children. Special care may be needed. Overdosage: If you think you have taken too much of this medicine contact a poison control center or emergency room at once. NOTE: This medicine is only for you. Do not share this medicine with others. What if I miss a dose? It is important not to miss a dose. Call your doctor or health care professional if you are unable to keep an appointment. What may interact with this medicine? -medicines for seizures -medicines to increase blood counts like filgrastim, pegfilgrastim, sargramostim -some antibiotics like amikacin, gentamicin, neomycin, streptomycin, tobramycin -vaccines Talk to your doctor or health care professional before taking any of these medicines: -acetaminophen -aspirin -ibuprofen -ketoprofen -naproxen This list may not describe all possible interactions. Give your health care provider a list of all the medicines, herbs, non-prescription drugs, or dietary supplements you use. Also tell them if you smoke, drink alcohol, or use illegal drugs. Some items may interact with your medicine. What should I watch for while using this medicine? Your condition will be monitored carefully while you are receiving this medicine. You will need important blood work done while you are taking this medicine. This drug may make you feel generally unwell. This is not uncommon, as chemotherapy can affect healthy cells as well as cancer cells. Report any side effects. Continue your course of treatment even  though you feel ill unless your doctor tells you to stop. In some cases, you may be given additional medicines to help with side effects. Follow all directions for their use. Call your doctor or health care professional for advice if you get a fever, chills or sore throat, or other symptoms of a cold or flu. Do not treat yourself. This drug decreases your body's ability to fight infections. Try to avoid being around people who are sick. This medicine may increase your risk to bruise or bleed. Call your doctor or health care professional if you notice any unusual bleeding. Be careful brushing and flossing your teeth or using a toothpick because you may get an infection or bleed more easily. If you have any dental work done, tell your dentist you are receiving this medicine. Avoid taking products that contain aspirin, acetaminophen, ibuprofen, naproxen, or ketoprofen unless instructed by your doctor. These medicines may hide a fever. Do not become pregnant while taking this medicine. Women should inform their doctor if they wish to become pregnant or think they might be pregnant. There is a potential for serious side effects to an unborn child. Talk to your health care professional or pharmacist for more information. Do not breast-feed an infant while taking this medicine. What side effects may I notice from receiving this medicine? Side  effects that you should report to your doctor or health care professional as soon as possible: -allergic reactions like skin rash, itching or hives, swelling of the face, lips, or tongue -signs of infection - fever or chills, cough, sore throat, pain or difficulty passing urine -signs of decreased platelets or bleeding - bruising, pinpoint red spots on the skin, black, tarry stools, nosebleeds -signs of decreased red blood cells - unusually weak or tired, fainting spells, lightheadedness -breathing problems -changes in hearing -changes in vision -chest pain -high  blood pressure -low blood counts - This drug may decrease the number of white blood cells, red blood cells and platelets. You may be at increased risk for infections and bleeding. -nausea and vomiting -pain, swelling, redness or irritation at the injection site -pain, tingling, numbness in the hands or feet -problems with balance, talking, walking -trouble passing urine or change in the amount of urine Side effects that usually do not require medical attention (report to your doctor or health care professional if they continue or are bothersome): -hair loss -loss of appetite -metallic taste in the mouth or changes in taste This list may not describe all possible side effects. Call your doctor for medical advice about side effects. You may report side effects to FDA at 1-800-FDA-1088. Where should I keep my medicine? This drug is given in a hospital or clinic and will not be stored at home. NOTE: This sheet is a summary. It may not cover all possible information. If you have questions about this medicine, talk to your doctor, pharmacist, or health care provider.    2016, Elsevier/Gold Standard. (2007-12-22 14:38:05)

## 2016-01-04 NOTE — Telephone Encounter (Signed)
Notified pt of K+ results and to pick up Rx at pharmacy

## 2016-01-04 NOTE — Progress Notes (Signed)
Quick Note:  Call patient with the result and order K Dur 20 meq po qd x7 days ______ 

## 2016-01-04 NOTE — Telephone Encounter (Signed)
-----   Message from Curt Bears, MD sent at 01/04/2016  4:23 PM EDT ----- Call patient with the result and order K Dur 20 meq po qd x 7 days.

## 2016-01-05 ENCOUNTER — Telehealth: Payer: Self-pay

## 2016-01-05 NOTE — Telephone Encounter (Signed)
-----   Message from [name], RN sent at 01/04/2016  5:03 PM EDT ----- Regarding: Chemo follow-up call: Tracy Dodson: [phone] Patient had treatment 1 of alimta/carbo. Tolerated treatment with no complication or complaints. Please call patient to follow up on 4/7.

## 2016-01-05 NOTE — Telephone Encounter (Signed)
lvm this is f/u chemo call. Call if any problems or concerns.

## 2016-01-08 ENCOUNTER — Telehealth: Payer: Self-pay | Admitting: Medical Oncology

## 2016-01-08 ENCOUNTER — Encounter: Payer: Self-pay | Admitting: Medical Oncology

## 2016-01-08 NOTE — Telephone Encounter (Addendum)
"  extreme headache all weekend , cannot sleep . i am not a headache person" .has taken Tylenol twice yesterday and today. She is taking nausea med.I called pt back and she feels like she "has an ice pick from the top of her head through  right eyeball to jawbone. "  It has subsided somewhat. She left early today because of it.Note to [place].

## 2016-01-08 NOTE — Telephone Encounter (Signed)
Per Julien Nordmann I told her to [facility] for now , take Tylenol and call back tomorrow with update.

## 2016-01-09 ENCOUNTER — Other Ambulatory Visit: Payer: Self-pay | Admitting: Medical Oncology

## 2016-01-09 ENCOUNTER — Encounter: Payer: Self-pay | Admitting: Medical Oncology

## 2016-01-09 DIAGNOSIS — E876 Hypokalemia: Secondary | ICD-10-CM

## 2016-01-09 MED ORDER — POTASSIUM CHLORIDE CRYS ER 20 MEQ PO TBCR: 20.0000 meq | EXTENDED_RELEASE_TABLET | Freq: Every day | ORAL | Status: DC

## 2016-01-09 NOTE — Progress Notes (Signed)
Took compazine this am and got a little confused and disoriented and lacked focus.Tracy Dodson Her headache is minimal. She requests antiemetic to take so she can drive and work. Note to [place].

## 2016-01-16 ENCOUNTER — Other Ambulatory Visit: Payer: Self-pay | Admitting: Radiology

## 2016-01-17 ENCOUNTER — Other Ambulatory Visit: Payer: Self-pay | Admitting: Radiology

## 2016-01-17 ENCOUNTER — Other Ambulatory Visit: Payer: Self-pay | Admitting: General Surgery

## 2016-01-18 ENCOUNTER — Ambulatory Visit (HOSPITAL_COMMUNITY)
Admission: RE | Admit: 2016-01-18 | Discharge: 2016-01-18 | Disposition: A | Discharge status: Home / Self Care | Payer: PRIVATE HEALTH INSURANCE | Source: Ambulatory Visit | Attending: Family Medicine | Admitting: Family Medicine

## 2016-01-18 ENCOUNTER — Ambulatory Visit (HOSPITAL_COMMUNITY)
Admission: RE | Admit: 2016-01-18 | Discharge: 2016-01-18 | Disposition: A | Discharge status: Home / Self Care | Payer: PRIVATE HEALTH INSURANCE | Source: Ambulatory Visit | Attending: Internal Medicine | Admitting: Internal Medicine

## 2016-01-18 ENCOUNTER — Encounter (HOSPITAL_COMMUNITY): Payer: Self-pay

## 2016-01-18 ENCOUNTER — Other Ambulatory Visit: Payer: Self-pay | Admitting: Internal Medicine

## 2016-01-18 DIAGNOSIS — R918 Other nonspecific abnormal finding of lung field: Secondary | ICD-10-CM | POA: Diagnosis not present

## 2016-01-18 DIAGNOSIS — Z79899 Other long term (current) drug therapy: Secondary | ICD-10-CM | POA: Insufficient documentation

## 2016-01-18 DIAGNOSIS — Z923 Personal history of irradiation: Secondary | ICD-10-CM | POA: Diagnosis not present

## 2016-01-18 DIAGNOSIS — Z902 Acquired absence of lung [part of]: Secondary | ICD-10-CM | POA: Insufficient documentation

## 2016-01-18 DIAGNOSIS — I10 Essential (primary) hypertension: Secondary | ICD-10-CM | POA: Diagnosis not present

## 2016-01-18 DIAGNOSIS — C342 Malignant neoplasm of middle lobe, bronchus or lung: Secondary | ICD-10-CM

## 2016-01-18 DIAGNOSIS — M353 Polymyalgia rheumatica: Secondary | ICD-10-CM | POA: Diagnosis not present

## 2016-01-18 DIAGNOSIS — Z9221 Personal history of antineoplastic chemotherapy: Secondary | ICD-10-CM | POA: Diagnosis not present

## 2016-01-18 DIAGNOSIS — Z7952 Long term (current) use of systemic steroids: Secondary | ICD-10-CM | POA: Insufficient documentation

## 2016-01-18 DIAGNOSIS — E785 Hyperlipidemia, unspecified: Secondary | ICD-10-CM | POA: Insufficient documentation

## 2016-01-18 LAB — CBC WITH DIFFERENTIAL/PLATELET
BASOS PCT: 0 %
Basophils Absolute: 0 10*3/uL (ref 0.0–0.1)
EOS ABS: 0 10*3/uL (ref 0.0–0.7)
EOS PCT: 0 %
HCT: 31.9 % — ABNORMAL LOW (ref 36.0–46.0)
HEMOGLOBIN: 10.6 g/dL — AB (ref 12.0–15.0)
LYMPHS ABS: 0.8 10*3/uL (ref 0.7–4.0)
Lymphocytes Relative: 21 %
MCH: 30.2 pg (ref 26.0–34.0)
MCHC: 33.2 g/dL (ref 30.0–36.0)
MCV: 90.9 fL (ref 78.0–100.0)
MONOS PCT: 35 %
Monocytes Absolute: 1.4 10*3/uL — ABNORMAL HIGH (ref 0.1–1.0)
NEUTROS PCT: 44 %
Neutro Abs: 1.8 10*3/uL (ref 1.7–7.7)
PLATELETS: 76 10*3/uL — AB (ref 150–400)
RBC: 3.51 MIL/uL — ABNORMAL LOW (ref 3.87–5.11)
RDW: 13 % (ref 11.5–15.5)
WBC: 4 10*3/uL (ref 4.0–10.5)

## 2016-01-18 LAB — PROTIME-INR
INR: 1.09 (ref 0.00–1.49)
PROTHROMBIN TIME: 13.8 s (ref 11.6–15.2)

## 2016-01-18 LAB — BASIC METABOLIC PANEL
Anion gap: 9 (ref 5–15)
BUN: 14 mg/dL (ref 6–20)
CALCIUM: 9.3 mg/dL (ref 8.9–10.3)
CO2: 33 mmol/L — AB (ref 22–32)
CREATININE: 0.79 mg/dL (ref 0.44–1.00)
Chloride: 97 mmol/L — ABNORMAL LOW (ref 101–111)
GFR calc non Af Amer: >60 mL/min (ref >60)
Glucose, Bld: 100 mg/dL — ABNORMAL HIGH (ref 65–99)
Potassium: 3.1 mmol/L — ABNORMAL LOW (ref 3.5–5.1)
SODIUM: 139 mmol/L (ref 135–145)

## 2016-01-18 MED ORDER — FENTANYL CITRATE (PF) 100 MCG/2ML IJ SOLN
INTRAMUSCULAR | Status: DC
Start: 2016-01-18 — End: 2016-01-19
  Filled 2016-01-18: qty 2

## 2016-01-18 MED ORDER — MIDAZOLAM HCL 2 MG/2ML IJ SOLN
INTRAMUSCULAR | Status: AC
  Filled 2016-01-18: qty 4

## 2016-01-18 MED ORDER — FENTANYL CITRATE (PF) 100 MCG/2ML IJ SOLN
INTRAMUSCULAR | Status: AC | PRN
  Administered 2016-01-18 (×2): 25 ug via INTRAVENOUS
  Administered 2016-01-18: 50 ug via INTRAVENOUS

## 2016-01-18 MED ORDER — HEPARIN SOD (PORK) LOCK FLUSH 100 UNIT/ML IV SOLN
INTRAVENOUS | Status: AC
  Filled 2016-01-18: qty 5

## 2016-01-18 MED ORDER — LIDOCAINE HCL 1 % IJ SOLN
INTRAMUSCULAR | Status: AC | PRN
  Administered 2016-01-18: 10 mL via INTRADERMAL

## 2016-01-18 MED ORDER — MIDAZOLAM HCL 2 MG/2ML IJ SOLN
INTRAMUSCULAR | Status: AC | PRN
  Administered 2016-01-18 (×3): 0.5 mg via INTRAVENOUS
  Administered 2016-01-18: 1 mg via INTRAVENOUS
  Administered 2016-01-18 (×2): 0.5 mg via INTRAVENOUS

## 2016-01-18 MED ORDER — LIDOCAINE HCL 1 % IJ SOLN
INTRAMUSCULAR | Status: AC
  Filled 2016-01-18: qty 20

## 2016-01-18 MED ORDER — CEFAZOLIN SODIUM-DEXTROSE 2-4 GM/100ML-% IV SOLN
2.0000 g | INTRAVENOUS | Status: AC
  Administered 2016-01-18: 2 g via INTRAVENOUS
  Filled 2016-01-18: qty 100

## 2016-01-18 MED ORDER — HEPARIN SOD (PORK) LOCK FLUSH 100 UNIT/ML IV SOLN
INTRAVENOUS | Status: AC | PRN
  Administered 2016-01-18: 500 [IU]

## 2016-01-18 MED ORDER — SODIUM CHLORIDE 0.9 % IV SOLN
INTRAVENOUS | Status: DC
  Administered 2016-01-18: 13:00:00 via INTRAVENOUS

## 2016-01-18 MED ORDER — LIDOCAINE-EPINEPHRINE (PF) 2 %-1:200000 IJ SOLN
INTRAMUSCULAR | Status: AC
  Filled 2016-01-18: qty 20

## 2016-01-18 NOTE — Sedation Documentation (Signed)
Patient denies pain and is resting comfortably.  
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
## 2016-01-18 NOTE — Procedures (Signed)
Interventional Radiology Procedure Note  Procedure: Placement of a right IJ approach single lumen PowerPort.  Tip is positioned at the superior cavoatrial junction and catheter is ready for immediate use.  Complications: No immediate Recommendations:  - Ok to shower tomorrow - Do not submerge for 7 days - Routine line care   Signed,  Heath K. McCullough, MD   

## 2016-01-18 NOTE — Discharge Instructions (Signed)
Implanted Port Insertion, Care After °Refer to this sheet in the next few weeks. These instructions provide you with information on caring for yourself after your procedure. Your health care provider may also give you more specific instructions. Your treatment has been planned according to current medical practices, but problems sometimes occur. Call your health care provider if you have any problems or questions after your procedure. °WHAT TO EXPECT AFTER THE PROCEDURE °After your procedure, it is typical to have the following:  °· Discomfort at the port insertion site. Ice packs to the area will help. °· Bruising on the skin over the port. This will subside in 3-4 days. °HOME CARE INSTRUCTIONS °· After your port is placed, you will get a manufacturer's information card. The card has information about your port. Keep this card with you at all times.   °· Know what kind of port you have. There are many types of ports available.   °· Wear a medical alert bracelet in case of an emergency. This can help alert health care workers that you have a port.   °· The port can stay in for as long as your health care provider believes it is necessary.   °· A home health care nurse may give medicines and take care of the port.   °· You or a family member can get special training and directions for giving medicine and taking care of the port at home.   °SEEK MEDICAL CARE IF:  °· Your port does not flush or you are unable to get a blood return.   °· You have a fever or chills. °SEEK IMMEDIATE MEDICAL CARE IF: °· You have new fluid or pus coming from your incision.   °· You notice a bad smell coming from your incision site.   °· You have swelling, pain, or more redness at the incision or port site.   °· You have chest pain or shortness of breath. °  °This information is not intended to replace advice given to you by your health care provider. Make sure you discuss any questions you have with your health care provider. °  °Document  Released: 07/07/2013 Document Revised: 09/21/2013 Document Reviewed: 07/07/2013 °Elsevier Interactive Patient Education ©2016 [employer] °Implanted Port Home Guide °An implanted port is a type of central line that is placed under the skin. Central lines are used to provide IV access when treatment or nutrition needs to be given through a person's veins. Implanted ports are used for long-term IV access. An implanted port may be placed because:  °· You need IV medicine that would be irritating to the small veins in your hands or arms.   °· You need long-term IV medicines, such as antibiotics.   °· You need IV nutrition for a long period.   °· You need frequent blood draws for lab tests.   °· You need dialysis.   °Implanted ports are usually placed in the chest area, but they can also be placed in the upper arm, the abdomen, or the leg. An implanted port has two main parts:  °· Reservoir. The reservoir is round and will appear as a small, raised area under your skin. The reservoir is the part where a needle is inserted to give medicines or draw blood.   °· Catheter. The catheter is a thin, flexible tube that extends from the reservoir. The catheter is placed into a large vein. Medicine that is inserted into the reservoir goes into the catheter and then into the vein.   °HOW WILL I CARE FOR MY INCISION SITE? °Do not get the   incision site wet. Bathe or shower as directed by your health care provider.  °HOW IS MY PORT ACCESSED? °Special steps must be taken to access the port:  °· Before the port is accessed, a numbing cream can be placed on the skin. This helps numb the skin over the port site.   °· Your health care provider uses a sterile technique to access the port. °· Your health care provider must put on a mask and sterile gloves. °· The skin over your port is cleaned carefully with an antiseptic and allowed to dry. °· The port is gently pinched between sterile gloves, and a needle is inserted into the  port. °· Only "non-coring" port needles should be used to access the port. Once the port is accessed, a blood return should be checked. This helps ensure that the port is in the vein and is not clogged.   °· If your port needs to remain accessed for a constant infusion, a clear (transparent) bandage will be placed over the needle site. The bandage and needle will need to be changed every week, or as directed by your health care provider.   °· Keep the bandage covering the needle clean and dry. Do not get it wet. Follow your health care provider's instructions on how to take a shower or bath while the port is accessed.   °· If your port does not need to stay accessed, no bandage is needed over the port.   °WHAT IS FLUSHING? °Flushing helps keep the port from getting clogged. Follow your health care provider's instructions on how and when to flush the port. Ports are usually flushed with saline solution or a medicine called heparin. The need for flushing will depend on how the port is used.  °· If the port is used for intermittent medicines or blood draws, the port will need to be flushed:   °· After medicines have been given.   °· After blood has been drawn.   °· As part of routine maintenance.   °· If a constant infusion is running, the port may not need to be flushed.   °HOW LONG WILL MY PORT STAY IMPLANTED? °The port can stay in for as long as your health care provider thinks it is needed. When it is time for the port to come out, surgery will be done to remove it. The procedure is similar to the one performed when the port was put in.  °WHEN SHOULD I SEEK IMMEDIATE MEDICAL CARE? °When you have an implanted port, you should seek immediate medical care if:  °· You notice a bad smell coming from the incision site.   °· You have swelling, redness, or drainage at the incision site.   °· You have more swelling or pain at the port site or the surrounding area.   °· You have a fever that is not controlled with  medicine. °  °This information is not intended to replace advice given to you by your health care provider. Make sure you discuss any questions you have with your health care provider. °  °Document Released: 09/16/2005 Document Revised: 07/07/2013 Document Reviewed: 05/24/2013 °Elsevier Interactive Patient Education ©2016 [employer] ° ° °Moderate Conscious Sedation, Adult, Care After °Refer to this sheet in the next few weeks. These instructions provide you with information on caring for yourself after your procedure. Your health care provider may also give you more specific instructions. Your treatment has been planned according to current medical practices, but problems sometimes occur. Call your health care provider if you have any problems or questions   after your procedure. °WHAT TO EXPECT AFTER THE PROCEDURE  °After your procedure: °· You may feel sleepy, clumsy, and have poor balance for several hours. °· Vomiting may occur if you eat too soon after the procedure. °HOME CARE INSTRUCTIONS °· Do not participate in any activities where you could become injured for at least 24 hours. Do not: °¨ Drive. °¨ Swim. °¨ Ride a bicycle. °¨ Operate heavy machinery. °¨ Cook. °¨ Use power tools. °¨ Climb ladders. °¨ Work from a high place. °· Do not make important decisions or sign legal documents until you are improved. °· If you vomit, drink water, juice, or soup when you can drink without vomiting. Make sure you have little or no nausea before eating solid foods. °· Only take over-the-counter or prescription medicines for pain, discomfort, or fever as directed by your health care provider. °· Make sure you and your family fully understand everything about the medicines given to you, including what side effects may occur. °· You should not drink alcohol, take sleeping pills, or take medicines that cause drowsiness for at least 24 hours. °· If you smoke, do not smoke without supervision. °· If you are feeling better, you  may resume normal activities 24 hours after you were sedated. °· Keep all appointments with your health care provider. °SEEK MEDICAL CARE IF: °· Your skin is pale or bluish in color. °· You continue to feel nauseous or vomit. °· Your pain is getting worse and is not helped by medicine. °· You have bleeding or swelling. °· You are still sleepy or feeling clumsy after 24 hours. °SEEK IMMEDIATE MEDICAL CARE IF: °· You develop a rash. °· You have difficulty breathing. °· You develop any type of allergic problem. °· You have a fever. °MAKE SURE YOU: °· Understand these instructions. °· Will watch your condition. °· Will get help right away if you are not doing well or get worse. °  °This information is not intended to replace advice given to you by your health care provider. Make sure you discuss any questions you have with your health care provider. °  °Document Released: 07/07/2013 Document Revised: 10/07/2014 Document Reviewed: 07/07/2013 °Elsevier Interactive Patient Education ©2016 [employer] Inc. ° °

## 2016-01-25 ENCOUNTER — Ambulatory Visit (HOSPITAL_BASED_OUTPATIENT_CLINIC_OR_DEPARTMENT_OTHER): Payer: PRIVATE HEALTH INSURANCE

## 2016-01-25 ENCOUNTER — Other Ambulatory Visit (HOSPITAL_BASED_OUTPATIENT_CLINIC_OR_DEPARTMENT_OTHER): Payer: PRIVATE HEALTH INSURANCE

## 2016-01-25 ENCOUNTER — Telehealth: Payer: Self-pay | Admitting: Internal Medicine

## 2016-01-25 ENCOUNTER — Ambulatory Visit (HOSPITAL_BASED_OUTPATIENT_CLINIC_OR_DEPARTMENT_OTHER): Payer: PRIVATE HEALTH INSURANCE | Admitting: Internal Medicine

## 2016-01-25 ENCOUNTER — Telehealth: Payer: Self-pay | Admitting: *Deleted

## 2016-01-25 ENCOUNTER — Encounter: Payer: Self-pay | Admitting: Internal Medicine

## 2016-01-25 VITALS — BP 155/86 | HR 92 | Temp 98.3°F | Resp 17 | Ht 65.0 in | Wt 149.3 lb

## 2016-01-25 DIAGNOSIS — Z5111 Encounter for antineoplastic chemotherapy: Secondary | ICD-10-CM

## 2016-01-25 DIAGNOSIS — C3431 Malignant neoplasm of lower lobe, right bronchus or lung: Secondary | ICD-10-CM

## 2016-01-25 DIAGNOSIS — R51 Headache: Secondary | ICD-10-CM | POA: Diagnosis not present

## 2016-01-25 DIAGNOSIS — C342 Malignant neoplasm of middle lobe, bronchus or lung: Secondary | ICD-10-CM | POA: Diagnosis not present

## 2016-01-25 DIAGNOSIS — R110 Nausea: Secondary | ICD-10-CM | POA: Diagnosis not present

## 2016-01-25 LAB — CBC WITH DIFFERENTIAL/PLATELET
BASO%: 0.4 % (ref 0.0–2.0)
BASOS ABS: 0 10*3/uL (ref 0.0–0.1)
EOS%: 0 % (ref 0.0–7.0)
Eosinophils Absolute: 0 10*3/uL (ref 0.0–0.5)
HCT: 33.5 % — ABNORMAL LOW (ref 34.8–46.6)
HEMOGLOBIN: 11 g/dL — AB (ref 11.6–15.9)
LYMPH%: 6.4 % — AB (ref 14.0–49.7)
MCH: 30.5 pg (ref 25.1–34.0)
MCHC: 32.8 g/dL (ref 31.5–36.0)
MCV: 92.8 fL (ref 79.5–101.0)
MONO#: 2.4 10*3/uL — ABNORMAL HIGH (ref 0.1–0.9)
MONO%: 22.5 % — AB (ref 0.0–14.0)
NEUT#: 7.5 10*3/uL — ABNORMAL HIGH (ref 1.5–6.5)
NEUT%: 70.7 % (ref 38.4–76.8)
Platelets: 129 10*3/uL — ABNORMAL LOW (ref 145–400)
RBC: 3.61 10*6/uL — AB (ref 3.70–5.45)
RDW: 14.6 % — AB (ref 11.2–14.5)
WBC: 10.6 10*3/uL — ABNORMAL HIGH (ref 3.9–10.3)
lymph#: 0.7 10*3/uL — ABNORMAL LOW (ref 0.9–3.3)

## 2016-01-25 LAB — COMPREHENSIVE METABOLIC PANEL
ALBUMIN: 4.1 g/dL (ref 3.5–5.0)
ALT: 26 U/L (ref 0–55)
AST: 22 U/L (ref 5–34)
Alkaline Phosphatase: 74 U/L (ref 40–150)
Anion Gap: 9 mEq/L (ref 3–11)
BUN: 11.5 mg/dL (ref 7.0–26.0)
CHLORIDE: 101 meq/L (ref 98–109)
CO2: 29 meq/L (ref 22–29)
Calcium: 9.6 mg/dL (ref 8.4–10.4)
Creatinine: 0.9 mg/dL (ref 0.6–1.1)
EGFR: 68 mL/min/{1.73_m2} — AB (ref >90)
GLUCOSE: 107 mg/dL (ref 70–140)
POTASSIUM: 3.6 meq/L (ref 3.5–5.1)
SODIUM: 138 meq/L (ref 136–145)
Total Bilirubin: 0.51 mg/dL (ref 0.20–1.20)
Total Protein: 6.9 g/dL (ref 6.4–8.3)

## 2016-01-25 MED ORDER — HEPARIN SOD (PORK) LOCK FLUSH 100 UNIT/ML IV SOLN
500.0000 [IU] | Freq: Once | INTRAVENOUS | Status: AC | PRN
  Administered 2016-01-25: 500 [IU]
  Filled 2016-01-25: qty 5

## 2016-01-25 MED ORDER — PALONOSETRON HCL INJECTION 0.25 MG/5ML
INTRAVENOUS | Status: AC
  Filled 2016-01-25: qty 5

## 2016-01-25 MED ORDER — SODIUM CHLORIDE 0.9 % IV SOLN
500.0000 mg | Freq: Once | INTRAVENOUS | Status: AC
  Administered 2016-01-25: 500 mg via INTRAVENOUS
  Filled 2016-01-25: qty 50

## 2016-01-25 MED ORDER — SODIUM CHLORIDE 0.9% FLUSH
10.0000 mL | INTRAVENOUS | Status: DC | PRN
  Administered 2016-01-25: 10 mL
  Filled 2016-01-25: qty 10

## 2016-01-25 MED ORDER — SODIUM CHLORIDE 0.9 % IV SOLN
510.0000 mg/m2 | Freq: Once | INTRAVENOUS | Status: AC
  Administered 2016-01-25: 900 mg via INTRAVENOUS
  Filled 2016-01-25: qty 32

## 2016-01-25 MED ORDER — TRAMADOL HCL 50 MG PO TABS: 50.0000 mg | ORAL_TABLET | Freq: Four times a day (QID) | ORAL | Status: DC | PRN

## 2016-01-25 MED ORDER — SODIUM CHLORIDE 0.9 % IV SOLN
Freq: Once | INTRAVENOUS | Status: AC
  Administered 2016-01-25: 13:00:00 via INTRAVENOUS

## 2016-01-25 MED ORDER — SODIUM CHLORIDE 0.9 % IV SOLN
10.0000 mg | Freq: Once | INTRAVENOUS | Status: AC
  Administered 2016-01-25: 10 mg via INTRAVENOUS
  Filled 2016-01-25: qty 1

## 2016-01-25 MED ORDER — ONDANSETRON HCL 8 MG PO TABS: 8.0000 mg | ORAL_TABLET | Freq: Three times a day (TID) | ORAL | Status: DC | PRN

## 2016-01-25 MED ORDER — PALONOSETRON HCL INJECTION 0.25 MG/5ML
0.2500 mg | Freq: Once | INTRAVENOUS | Status: AC
  Administered 2016-01-25: 0.25 mg via INTRAVENOUS

## 2016-01-25 NOTE — Telephone Encounter (Signed)
Per staff message and POF I have scheduled appts. Advised scheduler of appts. JMW  

## 2016-01-25 NOTE — Patient Instructions (Signed)
[facility] Discharge Instructions for Patients Receiving Chemotherapy  Today you received the following chemotherapy agents: Alimta & Carboplatin.  To help prevent nausea and vomiting after your treatment, we encourage you to take your nausea medication as prescribed.  If you develop nausea and vomiting that is not controlled by your nausea medication, call the clinic.   BELOW ARE SYMPTOMS THAT SHOULD BE REPORTED IMMEDIATELY:  *FEVER GREATER THAN 100.5 F  *CHILLS WITH OR WITHOUT FEVER  NAUSEA AND VOMITING THAT IS NOT CONTROLLED WITH YOUR NAUSEA MEDICATION  *UNUSUAL SHORTNESS OF BREATH  *UNUSUAL BRUISING OR BLEEDING  TENDERNESS IN MOUTH AND THROAT WITH OR WITHOUT PRESENCE OF ULCERS  *URINARY PROBLEMS  *BOWEL PROBLEMS  UNUSUAL RASH Items with * indicate a potential emergency and should be followed up as soon as possible.  Feel free to call the clinic you have any questions or concerns. The clinic phone number is [phone].  Please show the [facility] at check-in to the Emergency Department and triage nurse.  Pemetrexed injection What is this medicine? PEMETREXED (PEM e TREX ed) is a chemotherapy drug. This medicine affects cells that are rapidly growing, such as cancer cells and cells in your mouth and stomach. It is usually used to treat lung cancers like non-small cell lung cancer and mesothelioma. It may also be used to treat other cancers. This medicine may be used for other purposes; ask your health care provider or pharmacist if you have questions. What should I tell my health care provider before I take this medicine? They need to know if you have any of these conditions: -if you frequently drink alcohol containing beverages -infection (especially a virus infection such as chickenpox, cold sores, or herpes) -kidney disease -liver disease -low blood counts, like low platelets, red bloods, or white blood cells -an unusual or allergic reaction  to pemetrexed, mannitol, other medicines, foods, dyes, or preservatives -pregnant or trying to get pregnant -breast-feeding How should I use this medicine? This drug is given as an infusion into a vein. It is administered in a hospital or clinic by a specially trained health care professional. Talk to your pediatrician regarding the use of this medicine in children. Special care may be needed. Overdosage: If you think you have taken too much of this medicine contact a poison control center or emergency room at once. NOTE: This medicine is only for you. Do not share this medicine with others. What if I miss a dose? It is important not to miss your dose. Call your doctor or health care professional if you are unable to keep an appointment. What may interact with this medicine? -aspirin and aspirin-like medicines -medicines to increase blood counts like filgrastim, pegfilgrastim, sargramostim -methotrexate -NSAIDS, medicines for pain and inflammation, like ibuprofen or naproxen -probenecid -pyrimethamine -vaccines Talk to your doctor or health care professional before taking any of these medicines: -acetaminophen -aspirin -ibuprofen -ketoprofen -naproxen This list may not describe all possible interactions. Give your health care provider a list of all the medicines, herbs, non-prescription drugs, or dietary supplements you use. Also tell them if you smoke, drink alcohol, or use illegal drugs. Some items may interact with your medicine. What should I watch for while using this medicine? Visit your doctor for checks on your progress. This drug may make you feel generally unwell. This is not uncommon, as chemotherapy can affect healthy cells as well as cancer cells. Report any side effects. Continue your course of treatment even though you feel ill  unless your doctor tells you to stop. In some cases, you may be given additional medicines to help with side effects. Follow all directions for their  use. Call your doctor or health care professional for advice if you get a fever, chills or sore throat, or other symptoms of a cold or flu. Do not treat yourself. This drug decreases your body's ability to fight infections. Try to avoid being around people who are sick. This medicine may increase your risk to bruise or bleed. Call your doctor or health care professional if you notice any unusual bleeding. Be careful brushing and flossing your teeth or using a toothpick because you may get an infection or bleed more easily. If you have any dental work done, tell your dentist you are receiving this medicine. Avoid taking products that contain aspirin, acetaminophen, ibuprofen, naproxen, or ketoprofen unless instructed by your doctor. These medicines may hide a fever. Call your doctor or health care professional if you get diarrhea or mouth sores. Do not treat yourself. To protect your kidneys, drink water or other fluids as directed while you are taking this medicine. Men and women must use effective birth control while taking this medicine. You may also need to continue using effective birth control for a time after stopping this medicine. Do not become pregnant while taking this medicine. Tell your doctor right away if you think that you or your partner might be pregnant. There is a potential for serious side effects to an unborn child. Talk to your health care professional or pharmacist for more information. Do not breast-feed an infant while taking this medicine. This medicine may lower sperm counts. What side effects may I notice from receiving this medicine? Side effects that you should report to your doctor or health care professional as soon as possible: -allergic reactions like skin rash, itching or hives, swelling of the face, lips, or tongue -low blood counts - this medicine may decrease the number of white blood cells, red blood cells and platelets. You may be at increased risk for infections  and bleeding. -signs of infection - fever or chills, cough, sore throat, pain or difficulty passing urine -signs of decreased platelets or bleeding - bruising, pinpoint red spots on the skin, black, tarry stools, blood in the urine -signs of decreased red blood cells - unusually weak or tired, fainting spells, lightheadedness -breathing problems, like a dry cough -changes in emotions or moods -chest pain -confusion -diarrhea -high blood pressure -mouth or throat sores or ulcers -pain, swelling, warmth in the leg -pain on swallowing -swelling of the ankles, feet, hands -trouble passing urine or change in the amount of urine -vomiting -yellowing of the eyes or skin Side effects that usually do not require medical attention (report to your doctor or health care professional if they continue or are bothersome): -hair loss -loss of appetite -nausea -stomach upset This list may not describe all possible side effects. Call your doctor for medical advice about side effects. You may report side effects to FDA at 1-800-FDA-1088. Where should I keep my medicine? This drug is given in a hospital or clinic and will not be stored at home. NOTE: This sheet is a summary. It may not cover all possible information. If you have questions about this medicine, talk to your doctor, pharmacist, or health care provider.    2016, Elsevier/[employer]. (2008-04-19 13:24:03)  Carboplatin injection What is this medicine? CARBOPLATIN (KAR boe pla tin) is a chemotherapy drug. It targets fast dividing cells, like  cancer cells, and causes these cells to die. This medicine is used to treat ovarian cancer and many other cancers. This medicine may be used for other purposes; ask your health care provider or pharmacist if you have questions. What should I tell my health care provider before I take this medicine? They need to know if you have any of these conditions: -blood disorders -hearing problems -kidney  disease -recent or ongoing radiation therapy -an unusual or allergic reaction to carboplatin, cisplatin, other chemotherapy, other medicines, foods, dyes, or preservatives -pregnant or trying to get pregnant -breast-feeding How should I use this medicine? This drug is usually given as an infusion into a vein. It is administered in a hospital or clinic by a specially trained health care professional. Talk to your pediatrician regarding the use of this medicine in children. Special care may be needed. Overdosage: If you think you have taken too much of this medicine contact a poison control center or emergency room at once. NOTE: This medicine is only for you. Do not share this medicine with others. What if I miss a dose? It is important not to miss a dose. Call your doctor or health care professional if you are unable to keep an appointment. What may interact with this medicine? -medicines for seizures -medicines to increase blood counts like filgrastim, pegfilgrastim, sargramostim -some antibiotics like amikacin, gentamicin, neomycin, streptomycin, tobramycin -vaccines Talk to your doctor or health care professional before taking any of these medicines: -acetaminophen -aspirin -ibuprofen -ketoprofen -naproxen This list may not describe all possible interactions. Give your health care provider a list of all the medicines, herbs, non-prescription drugs, or dietary supplements you use. Also tell them if you smoke, drink alcohol, or use illegal drugs. Some items may interact with your medicine. What should I watch for while using this medicine? Your condition will be monitored carefully while you are receiving this medicine. You will need important blood work done while you are taking this medicine. This drug may make you feel generally unwell. This is not uncommon, as chemotherapy can affect healthy cells as well as cancer cells. Report any side effects. Continue your course of treatment even  though you feel ill unless your doctor tells you to stop. In some cases, you may be given additional medicines to help with side effects. Follow all directions for their use. Call your doctor or health care professional for advice if you get a fever, chills or sore throat, or other symptoms of a cold or flu. Do not treat yourself. This drug decreases your body's ability to fight infections. Try to avoid being around people who are sick. This medicine may increase your risk to bruise or bleed. Call your doctor or health care professional if you notice any unusual bleeding. Be careful brushing and flossing your teeth or using a toothpick because you may get an infection or bleed more easily. If you have any dental work done, tell your dentist you are receiving this medicine. Avoid taking products that contain aspirin, acetaminophen, ibuprofen, naproxen, or ketoprofen unless instructed by your doctor. These medicines may hide a fever. Do not become pregnant while taking this medicine. Women should inform their doctor if they wish to become pregnant or think they might be pregnant. There is a potential for serious side effects to an unborn child. Talk to your health care professional or pharmacist for more information. Do not breast-feed an infant while taking this medicine. What side effects may I notice from receiving this medicine? Side  effects that you should report to your doctor or health care professional as soon as possible: -allergic reactions like skin rash, itching or hives, swelling of the face, lips, or tongue -signs of infection - fever or chills, cough, sore throat, pain or difficulty passing urine -signs of decreased platelets or bleeding - bruising, pinpoint red spots on the skin, black, tarry stools, nosebleeds -signs of decreased red blood cells - unusually weak or tired, fainting spells, lightheadedness -breathing problems -changes in hearing -changes in vision -chest pain -high  blood pressure -low blood counts - This drug may decrease the number of white blood cells, red blood cells and platelets. You may be at increased risk for infections and bleeding. -nausea and vomiting -pain, swelling, redness or irritation at the injection site -pain, tingling, numbness in the hands or feet -problems with balance, talking, walking -trouble passing urine or change in the amount of urine Side effects that usually do not require medical attention (report to your doctor or health care professional if they continue or are bothersome): -hair loss -loss of appetite -metallic taste in the mouth or changes in taste This list may not describe all possible side effects. Call your doctor for medical advice about side effects. You may report side effects to FDA at 1-800-FDA-1088. Where should I keep my medicine? This drug is given in a hospital or clinic and will not be stored at home. NOTE: This sheet is a summary. It may not cover all possible information. If you have questions about this medicine, talk to your doctor, pharmacist, or health care provider.    2016, [employer]. (2007-12-22 14:38:05)
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
## 2016-01-25 NOTE — Telephone Encounter (Signed)
per pof to sch pt appt-sent Mw email to sch tmrt-pt to get updated copy b4 leaving**

## 2016-02-05 ENCOUNTER — Encounter: Payer: Self-pay | Admitting: Internal Medicine

## 2016-02-15 ENCOUNTER — Ambulatory Visit (HOSPITAL_BASED_OUTPATIENT_CLINIC_OR_DEPARTMENT_OTHER): Payer: PRIVATE HEALTH INSURANCE

## 2016-02-15 ENCOUNTER — Encounter: Payer: Self-pay | Admitting: Internal Medicine

## 2016-02-15 ENCOUNTER — Ambulatory Visit (HOSPITAL_BASED_OUTPATIENT_CLINIC_OR_DEPARTMENT_OTHER): Payer: PRIVATE HEALTH INSURANCE | Admitting: Internal Medicine

## 2016-02-15 ENCOUNTER — Other Ambulatory Visit (HOSPITAL_BASED_OUTPATIENT_CLINIC_OR_DEPARTMENT_OTHER): Payer: PRIVATE HEALTH INSURANCE

## 2016-02-15 ENCOUNTER — Telehealth: Payer: Self-pay | Admitting: Internal Medicine

## 2016-02-15 VITALS — BP 136/73 | HR 111 | Temp 98.2°F | Resp 17 | Ht 65.0 in | Wt 147.7 lb

## 2016-02-15 DIAGNOSIS — C342 Malignant neoplasm of middle lobe, bronchus or lung: Secondary | ICD-10-CM

## 2016-02-15 DIAGNOSIS — R110 Nausea: Secondary | ICD-10-CM

## 2016-02-15 DIAGNOSIS — C3431 Malignant neoplasm of lower lobe, right bronchus or lung: Secondary | ICD-10-CM

## 2016-02-15 DIAGNOSIS — Z5111 Encounter for antineoplastic chemotherapy: Secondary | ICD-10-CM

## 2016-02-15 DIAGNOSIS — T451X5A Adverse effect of antineoplastic and immunosuppressive drugs, initial encounter: Secondary | ICD-10-CM

## 2016-02-15 DIAGNOSIS — C3491 Malignant neoplasm of unspecified part of right bronchus or lung: Secondary | ICD-10-CM

## 2016-02-15 DIAGNOSIS — D6481 Anemia due to antineoplastic chemotherapy: Secondary | ICD-10-CM

## 2016-02-15 HISTORY — DX: Anemia due to antineoplastic chemotherapy: D64.81

## 2016-02-15 LAB — CBC WITH DIFFERENTIAL/PLATELET
BASO%: 0.4 % (ref 0.0–2.0)
BASOS ABS: 0 10*3/uL (ref 0.0–0.1)
EOS ABS: 0 10*3/uL (ref 0.0–0.5)
EOS%: 0 % (ref 0.0–7.0)
HEMATOCRIT: 30 % — AB (ref 34.8–46.6)
HEMOGLOBIN: 10 g/dL — AB (ref 11.6–15.9)
LYMPH%: 6.2 % — ABNORMAL LOW (ref 14.0–49.7)
MCH: 31.2 pg (ref 25.1–34.0)
MCHC: 33.3 g/dL (ref 31.5–36.0)
MCV: 93.7 fL (ref 79.5–101.0)
MONO#: 2.8 10*3/uL — ABNORMAL HIGH (ref 0.1–0.9)
MONO%: 25.2 % — AB (ref 0.0–14.0)
NEUT#: 7.6 10*3/uL — ABNORMAL HIGH (ref 1.5–6.5)
NEUT%: 68.2 % (ref 38.4–76.8)
Platelets: 123 10*3/uL — ABNORMAL LOW (ref 145–400)
RBC: 3.2 10*6/uL — ABNORMAL LOW (ref 3.70–5.45)
RDW: 18.9 % — AB (ref 11.2–14.5)
WBC: 11.2 10*3/uL — ABNORMAL HIGH (ref 3.9–10.3)
lymph#: 0.7 10*3/uL — ABNORMAL LOW (ref 0.9–3.3)

## 2016-02-15 LAB — COMPREHENSIVE METABOLIC PANEL
ALBUMIN: 4.4 g/dL (ref 3.5–5.0)
ALK PHOS: 70 U/L (ref 40–150)
ALT: 28 U/L (ref 0–55)
AST: 25 U/L (ref 5–34)
Anion Gap: 11 mEq/L (ref 3–11)
BUN: 14.2 mg/dL (ref 7.0–26.0)
CALCIUM: 10.1 mg/dL (ref 8.4–10.4)
CO2: 26 mEq/L (ref 22–29)
Chloride: 104 mEq/L (ref 98–109)
Creatinine: 0.8 mg/dL (ref 0.6–1.1)
EGFR: 74 mL/min/{1.73_m2} — AB (ref >90)
Glucose: 118 mg/dl (ref 70–140)
POTASSIUM: 3.4 meq/L — AB (ref 3.5–5.1)
SODIUM: 140 meq/L (ref 136–145)
Total Bilirubin: 0.66 mg/dL (ref 0.20–1.20)
Total Protein: 7.4 g/dL (ref 6.4–8.3)

## 2016-02-15 MED ORDER — CYANOCOBALAMIN 1000 MCG/ML IJ SOLN
1000.0000 ug | Freq: Once | INTRAMUSCULAR | Status: AC
  Administered 2016-02-15: 1000 ug via INTRAMUSCULAR

## 2016-02-15 MED ORDER — SODIUM CHLORIDE 0.9 % IV SOLN
510.0000 mg/m2 | Freq: Once | INTRAVENOUS | Status: AC
  Administered 2016-02-15: 900 mg via INTRAVENOUS
  Filled 2016-02-15: qty 32

## 2016-02-15 MED ORDER — SODIUM CHLORIDE 0.9 % IV SOLN
Freq: Once | INTRAVENOUS | Status: AC
  Administered 2016-02-15: 12:00:00 via INTRAVENOUS

## 2016-02-15 MED ORDER — SODIUM CHLORIDE 0.9 % IV SOLN
517.0000 mg | Freq: Once | INTRAVENOUS | Status: AC
  Administered 2016-02-15: 520 mg via INTRAVENOUS
  Filled 2016-02-15: qty 52

## 2016-02-15 MED ORDER — PALONOSETRON HCL INJECTION 0.25 MG/5ML
INTRAVENOUS | Status: AC
  Filled 2016-02-15: qty 5

## 2016-02-15 MED ORDER — PALONOSETRON HCL INJECTION 0.25 MG/5ML
0.2500 mg | Freq: Once | INTRAVENOUS | Status: AC
  Administered 2016-02-15: 0.25 mg via INTRAVENOUS

## 2016-02-15 MED ORDER — SODIUM CHLORIDE 0.9% FLUSH
10.0000 mL | INTRAVENOUS | Status: DC | PRN
  Administered 2016-02-15: 10 mL
  Filled 2016-02-15: qty 10

## 2016-02-15 MED ORDER — HEPARIN SOD (PORK) LOCK FLUSH 100 UNIT/ML IV SOLN
500.0000 [IU] | Freq: Once | INTRAVENOUS | Status: AC | PRN
  Administered 2016-02-15: 500 [IU]
  Filled 2016-02-15: qty 5

## 2016-02-15 MED ORDER — CYANOCOBALAMIN 1000 MCG/ML IJ SOLN
INTRAMUSCULAR | Status: AC
  Filled 2016-02-15: qty 1

## 2016-02-15 MED ORDER — SODIUM CHLORIDE 0.9 % IV SOLN
10.0000 mg | Freq: Once | INTRAVENOUS | Status: AC
  Administered 2016-02-15: 10 mg via INTRAVENOUS
  Filled 2016-02-15: qty 1

## 2016-02-15 NOTE — Telephone Encounter (Signed)
Gave and printed appt sched and avs for pt for may and june
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
## 2016-02-15 NOTE — Patient Instructions (Signed)
[facility] Discharge Instructions for Patients Receiving Chemotherapy  Today you received the following chemotherapy agents Alimta/Carboplatin  To help prevent nausea and vomiting after your treatment, we encourage you to take your nausea medication as prescribed.   If you develop nausea and vomiting that is not controlled by your nausea medication, call the clinic.   BELOW ARE SYMPTOMS THAT SHOULD BE REPORTED IMMEDIATELY:  *FEVER GREATER THAN 100.5 F  *CHILLS WITH OR WITHOUT FEVER  NAUSEA AND VOMITING THAT IS NOT CONTROLLED WITH YOUR NAUSEA MEDICATION  *UNUSUAL SHORTNESS OF BREATH  *UNUSUAL BRUISING OR BLEEDING  TENDERNESS IN MOUTH AND THROAT WITH OR WITHOUT PRESENCE OF ULCERS  *URINARY PROBLEMS  *BOWEL PROBLEMS  UNUSUAL RASH Items with * indicate a potential emergency and should be followed up as soon as possible.  Feel free to call the clinic you have any questions or concerns. The clinic phone number is [phone].  Please show the CHEMO ALERT CARD at check-in to the Emergency Department and triage nurse.   

## 2016-02-22 ENCOUNTER — Other Ambulatory Visit (HOSPITAL_BASED_OUTPATIENT_CLINIC_OR_DEPARTMENT_OTHER): Payer: PRIVATE HEALTH INSURANCE

## 2016-02-22 ENCOUNTER — Encounter: Payer: Self-pay | Admitting: Internal Medicine

## 2016-02-22 DIAGNOSIS — C342 Malignant neoplasm of middle lobe, bronchus or lung: Secondary | ICD-10-CM

## 2016-02-22 LAB — COMPREHENSIVE METABOLIC PANEL
ALK PHOS: 71 U/L (ref 40–150)
ALT: 38 U/L (ref 0–55)
ANION GAP: 10 meq/L (ref 3–11)
AST: 35 U/L — ABNORMAL HIGH (ref 5–34)
Albumin: 4.1 g/dL (ref 3.5–5.0)
BUN: 15.3 mg/dL (ref 7.0–26.0)
CO2: 27 meq/L (ref 22–29)
Calcium: 9.5 mg/dL (ref 8.4–10.4)
Chloride: 99 mEq/L (ref 98–109)
Creatinine: 0.8 mg/dL (ref 0.6–1.1)
EGFR: 76 mL/min/{1.73_m2} — AB (ref >90)
GLUCOSE: 98 mg/dL (ref 70–140)
POTASSIUM: 3.2 meq/L — AB (ref 3.5–5.1)
SODIUM: 137 meq/L (ref 136–145)
Total Bilirubin: 0.99 mg/dL (ref 0.20–1.20)
Total Protein: 6.9 g/dL (ref 6.4–8.3)

## 2016-02-22 LAB — CBC WITH DIFFERENTIAL/PLATELET
BASO%: 0 % (ref 0.0–2.0)
BASOS ABS: 0 10*3/uL (ref 0.0–0.1)
EOS ABS: 0 10*3/uL (ref 0.0–0.5)
EOS%: 0 % (ref 0.0–7.0)
HCT: 28.8 % — ABNORMAL LOW (ref 34.8–46.6)
HGB: 9.6 g/dL — ABNORMAL LOW (ref 11.6–15.9)
LYMPH%: 16.3 % (ref 14.0–49.7)
MCH: 31.1 pg (ref 25.1–34.0)
MCHC: 33.3 g/dL (ref 31.5–36.0)
MCV: 93.2 fL (ref 79.5–101.0)
MONO#: 1.7 10*3/uL — ABNORMAL HIGH (ref 0.1–0.9)
MONO%: 39 % — AB (ref 0.0–14.0)
NEUT#: 1.9 10*3/uL (ref 1.5–6.5)
NEUT%: 44.7 % (ref 38.4–76.8)
Platelets: 74 10*3/uL — ABNORMAL LOW (ref 145–400)
RBC: 3.09 10*6/uL — AB (ref 3.70–5.45)
RDW: 16.5 % — ABNORMAL HIGH (ref 11.2–14.5)
WBC: 4.2 10*3/uL (ref 3.9–10.3)
lymph#: 0.7 10*3/uL — ABNORMAL LOW (ref 0.9–3.3)

## 2016-02-22 NOTE — Progress Notes (Signed)
Quick Note:  Call patient with the result and increase K rich diet. ______

## 2016-02-23 ENCOUNTER — Telehealth: Payer: Self-pay | Admitting: Medical Oncology

## 2016-02-23 ENCOUNTER — Other Ambulatory Visit: Payer: Self-pay | Admitting: Medical Oncology

## 2016-02-23 ENCOUNTER — Encounter: Payer: Self-pay | Admitting: Medical Oncology

## 2016-02-23 NOTE — Telephone Encounter (Signed)
Feels very weak this week- I told her hgb has drifted down . She denies light headedness or dizzy. I told her to monitor for now.

## 2016-02-25 ENCOUNTER — Encounter: Payer: Self-pay | Admitting: Internal Medicine

## 2016-02-29 ENCOUNTER — Ambulatory Visit (HOSPITAL_COMMUNITY)
Admission: RE | Admit: 2016-02-29 | Discharge: 2016-02-29 | Disposition: A | Discharge status: Home / Self Care | Payer: PRIVATE HEALTH INSURANCE | Source: Ambulatory Visit | Attending: Internal Medicine | Admitting: Internal Medicine

## 2016-02-29 ENCOUNTER — Telehealth: Payer: Self-pay | Admitting: Medical Oncology

## 2016-02-29 ENCOUNTER — Telehealth: Payer: Self-pay | Admitting: Internal Medicine

## 2016-02-29 ENCOUNTER — Other Ambulatory Visit (HOSPITAL_BASED_OUTPATIENT_CLINIC_OR_DEPARTMENT_OTHER): Payer: PRIVATE HEALTH INSURANCE

## 2016-02-29 ENCOUNTER — Telehealth: Payer: Self-pay | Admitting: *Deleted

## 2016-02-29 ENCOUNTER — Other Ambulatory Visit: Payer: Self-pay | Admitting: Medical Oncology

## 2016-02-29 ENCOUNTER — Encounter (HOSPITAL_COMMUNITY): Payer: Self-pay

## 2016-02-29 DIAGNOSIS — C342 Malignant neoplasm of middle lobe, bronchus or lung: Secondary | ICD-10-CM | POA: Insufficient documentation

## 2016-02-29 DIAGNOSIS — I2510 Atherosclerotic heart disease of native coronary artery without angina pectoris: Secondary | ICD-10-CM | POA: Diagnosis not present

## 2016-02-29 DIAGNOSIS — K8020 Calculus of gallbladder without cholecystitis without obstruction: Secondary | ICD-10-CM | POA: Diagnosis not present

## 2016-02-29 DIAGNOSIS — E876 Hypokalemia: Secondary | ICD-10-CM | POA: Insufficient documentation

## 2016-02-29 DIAGNOSIS — Z9221 Personal history of antineoplastic chemotherapy: Secondary | ICD-10-CM | POA: Diagnosis not present

## 2016-02-29 DIAGNOSIS — C3431 Malignant neoplasm of lower lobe, right bronchus or lung: Secondary | ICD-10-CM

## 2016-02-29 DIAGNOSIS — C3491 Malignant neoplasm of unspecified part of right bronchus or lung: Secondary | ICD-10-CM | POA: Diagnosis present

## 2016-02-29 DIAGNOSIS — Z5111 Encounter for antineoplastic chemotherapy: Secondary | ICD-10-CM

## 2016-02-29 DIAGNOSIS — D6481 Anemia due to antineoplastic chemotherapy: Secondary | ICD-10-CM

## 2016-02-29 DIAGNOSIS — E279 Disorder of adrenal gland, unspecified: Secondary | ICD-10-CM | POA: Insufficient documentation

## 2016-02-29 DIAGNOSIS — T451X5A Adverse effect of antineoplastic and immunosuppressive drugs, initial encounter: Secondary | ICD-10-CM

## 2016-02-29 DIAGNOSIS — D649 Anemia, unspecified: Secondary | ICD-10-CM | POA: Insufficient documentation

## 2016-02-29 LAB — COMPREHENSIVE METABOLIC PANEL
ALT: 37 U/L (ref 0–55)
ANION GAP: 8 meq/L (ref 3–11)
AST: 31 U/L (ref 5–34)
Albumin: 4 g/dL (ref 3.5–5.0)
Alkaline Phosphatase: 60 U/L (ref 40–150)
BILIRUBIN TOTAL: 0.68 mg/dL (ref 0.20–1.20)
BUN: 9.5 mg/dL (ref 7.0–26.0)
CHLORIDE: 98 meq/L (ref 98–109)
CO2: 31 meq/L — AB (ref 22–29)
Calcium: 9.3 mg/dL (ref 8.4–10.4)
Creatinine: 0.9 mg/dL (ref 0.6–1.1)
EGFR: 71 mL/min/{1.73_m2} — AB (ref >90)
GLUCOSE: 112 mg/dL (ref 70–140)
POTASSIUM: 2.7 meq/L — AB (ref 3.5–5.1)
SODIUM: 137 meq/L (ref 136–145)
TOTAL PROTEIN: 6.7 g/dL (ref 6.4–8.3)

## 2016-02-29 LAB — CBC WITH DIFFERENTIAL/PLATELET
BASO%: 0.2 % (ref 0.0–2.0)
Basophils Absolute: 0 10*3/uL (ref 0.0–0.1)
EOS ABS: 0 10*3/uL (ref 0.0–0.5)
EOS%: 0.1 % (ref 0.0–7.0)
HCT: 23.4 % — ABNORMAL LOW (ref 34.8–46.6)
HGB: 7.9 g/dL — ABNORMAL LOW (ref 11.6–15.9)
LYMPH%: 21.6 % (ref 14.0–49.7)
MCH: 31.5 pg (ref 25.1–34.0)
MCHC: 33.6 g/dL (ref 31.5–36.0)
MCV: 93.7 fL (ref 79.5–101.0)
MONO#: 1.8 10*3/uL — AB (ref 0.1–0.9)
MONO%: 43 % — AB (ref 0.0–14.0)
NEUT#: 1.5 10*3/uL (ref 1.5–6.5)
NEUT%: 35.1 % — AB (ref 38.4–76.8)
PLATELETS: 51 10*3/uL — AB (ref 145–400)
RBC: 2.5 10*6/uL — AB (ref 3.70–5.45)
RDW: 17.8 % — ABNORMAL HIGH (ref 11.2–14.5)
WBC: 4.2 10*3/uL (ref 3.9–10.3)
lymph#: 0.9 10*3/uL (ref 0.9–3.3)

## 2016-02-29 MED ORDER — IOPAMIDOL (ISOVUE-300) INJECTION 61%
100.0000 mL | Freq: Once | INTRAVENOUS | Status: AC | PRN
  Administered 2016-02-29: 100 mL via INTRAVENOUS

## 2016-02-29 MED ORDER — POTASSIUM CHLORIDE CRYS ER 20 MEQ PO TBCR: 40.0000 meq | EXTENDED_RELEASE_TABLET | Freq: Every day | ORAL | Status: DC

## 2016-02-29 NOTE — Telephone Encounter (Signed)
spoke w/ pt confirmed 6/2 lab apt & 6/3 blood

## 2016-02-29 NOTE — Telephone Encounter (Signed)
Per staff message and POF I have scheduled appts. Advised scheduler of appts. JMW  

## 2016-02-29 NOTE — Progress Notes (Signed)
Quick Note:  Call patient with the result and order K Dur 40 meq po qd X 10 days. She will also need 2 units of PRBCs transfusion. ______

## 2016-02-29 NOTE — Telephone Encounter (Signed)
Pt notified that someone will call her with appt.

## 2016-03-01 ENCOUNTER — Other Ambulatory Visit: Payer: PRIVATE HEALTH INSURANCE

## 2016-03-01 DIAGNOSIS — D649 Anemia, unspecified: Secondary | ICD-10-CM | POA: Diagnosis present

## 2016-03-01 LAB — ABO/RH: ABO/RH(D): O POS

## 2016-03-01 LAB — PREPARE RBC (CROSSMATCH)

## 2016-03-02 ENCOUNTER — Ambulatory Visit: Payer: PRIVATE HEALTH INSURANCE

## 2016-03-02 VITALS — BP 114/62 | HR 82 | Temp 98.0°F | Resp 18

## 2016-03-02 DIAGNOSIS — D649 Anemia, unspecified: Secondary | ICD-10-CM

## 2016-03-02 MED ORDER — ACETAMINOPHEN 325 MG PO TABS
ORAL_TABLET | ORAL | Status: AC
  Filled 2016-03-02: qty 2

## 2016-03-02 MED ORDER — ACETAMINOPHEN 325 MG PO TABS
650.0000 mg | ORAL_TABLET | Freq: Once | ORAL | Status: AC
  Administered 2016-03-02: 650 mg via ORAL

## 2016-03-02 MED ORDER — DIPHENHYDRAMINE HCL 25 MG PO CAPS
25.0000 mg | ORAL_CAPSULE | Freq: Once | ORAL | Status: AC
  Administered 2016-03-02: 25 mg via ORAL

## 2016-03-02 MED ORDER — SODIUM CHLORIDE 0.9 % IV SOLN
250.0000 mL | Freq: Once | INTRAVENOUS | Status: AC
  Administered 2016-03-02: 250 mL via INTRAVENOUS

## 2016-03-02 MED ORDER — SODIUM CHLORIDE 0.9% FLUSH
10.0000 mL | INTRAVENOUS | Status: AC | PRN
  Administered 2016-03-02: 10 mL
  Filled 2016-03-02: qty 10

## 2016-03-02 MED ORDER — HEPARIN SOD (PORK) LOCK FLUSH 100 UNIT/ML IV SOLN
500.0000 [IU] | Freq: Every day | INTRAVENOUS | Status: AC | PRN
Start: 2016-03-02 — End: 2016-03-02
  Administered 2016-03-02: 500 [IU]
  Filled 2016-03-02: qty 5

## 2016-03-02 MED ORDER — DIPHENHYDRAMINE HCL 25 MG PO CAPS
ORAL_CAPSULE | ORAL | Status: AC
  Filled 2016-03-02: qty 1

## 2016-03-02 NOTE — Patient Instructions (Signed)
Blood Transfusion, Care After  These instructions give you information about caring for yourself after your procedure. Your doctor may also give you more specific instructions. Call your doctor if you have any problems or questions after your procedure.   HOME CARE   Take medicines only as told by your doctor. Ask your doctor if you can take an over-the-counter pain reliever if you have a fever or headache a day or two after your procedure.   Return to your normal activities as told by your doctor.  GET HELP IF:    You develop redness or irritation at your IV site.   You have a fever, chills, or a headache that does not go away.   Your pee (urine) is darker than normal.   Your urine turns:    Pink.    Red.    Brown.   The white part of your eye turns yellow (jaundice).   You feel weak after doing your normal activities.  GET HELP RIGHT AWAY IF:    You have trouble breathing.   You have fever and chills and you also have:    Anxiety.    Chest or back pain.    Flushed or pink skin.    Clammy or sweaty skin.    A fast heartbeat.    A sick feeling in your stomach (nausea).     This information is not intended to replace advice given to you by your health care provider. Make sure you discuss any questions you have with your health care provider.     Document Released: 10/07/2014 Document Reviewed: 10/07/2014  Elsevier Interactive Patient Education 2016 [employer]

## 2016-03-04 LAB — TYPE AND SCREEN
ABO/RH(D): O POS
ANTIBODY SCREEN: NEGATIVE
UNIT DIVISION: 0
Unit division: 0

## 2016-03-07 ENCOUNTER — Other Ambulatory Visit (HOSPITAL_BASED_OUTPATIENT_CLINIC_OR_DEPARTMENT_OTHER): Payer: PRIVATE HEALTH INSURANCE

## 2016-03-07 ENCOUNTER — Encounter: Payer: Self-pay | Admitting: Internal Medicine

## 2016-03-07 ENCOUNTER — Ambulatory Visit (HOSPITAL_BASED_OUTPATIENT_CLINIC_OR_DEPARTMENT_OTHER): Payer: PRIVATE HEALTH INSURANCE

## 2016-03-07 ENCOUNTER — Telehealth: Payer: Self-pay | Admitting: Internal Medicine

## 2016-03-07 ENCOUNTER — Ambulatory Visit (HOSPITAL_BASED_OUTPATIENT_CLINIC_OR_DEPARTMENT_OTHER): Payer: PRIVATE HEALTH INSURANCE | Admitting: Internal Medicine

## 2016-03-07 VITALS — BP 122/74 | HR 106 | Temp 98.2°F | Resp 18 | Wt 147.2 lb

## 2016-03-07 DIAGNOSIS — C342 Malignant neoplasm of middle lobe, bronchus or lung: Secondary | ICD-10-CM | POA: Diagnosis not present

## 2016-03-07 DIAGNOSIS — D6481 Anemia due to antineoplastic chemotherapy: Secondary | ICD-10-CM

## 2016-03-07 DIAGNOSIS — R5383 Other fatigue: Secondary | ICD-10-CM | POA: Diagnosis not present

## 2016-03-07 DIAGNOSIS — T451X5A Adverse effect of antineoplastic and immunosuppressive drugs, initial encounter: Secondary | ICD-10-CM

## 2016-03-07 DIAGNOSIS — D61810 Antineoplastic chemotherapy induced pancytopenia: Secondary | ICD-10-CM | POA: Diagnosis not present

## 2016-03-07 DIAGNOSIS — Z5111 Encounter for antineoplastic chemotherapy: Secondary | ICD-10-CM

## 2016-03-07 DIAGNOSIS — C3491 Malignant neoplasm of unspecified part of right bronchus or lung: Secondary | ICD-10-CM

## 2016-03-07 LAB — CBC WITH DIFFERENTIAL/PLATELET
BASO%: 0.3 % (ref 0.0–2.0)
BASOS ABS: 0 10*3/uL (ref 0.0–0.1)
EOS ABS: 0 10*3/uL (ref 0.0–0.5)
EOS%: 0 % (ref 0.0–7.0)
HEMATOCRIT: 33.9 % — AB (ref 34.8–46.6)
HEMOGLOBIN: 11.3 g/dL — AB (ref 11.6–15.9)
LYMPH#: 0.5 10*3/uL — AB (ref 0.9–3.3)
LYMPH%: 5.5 % — ABNORMAL LOW (ref 14.0–49.7)
MCH: 31.8 pg (ref 25.1–34.0)
MCHC: 33.3 g/dL (ref 31.5–36.0)
MCV: 95.7 fL (ref 79.5–101.0)
MONO#: 2 10*3/uL — AB (ref 0.1–0.9)
MONO%: 22.5 % — ABNORMAL HIGH (ref 0.0–14.0)
NEUT#: 6.4 10*3/uL (ref 1.5–6.5)
NEUT%: 71.7 % (ref 38.4–76.8)
PLATELETS: 114 10*3/uL — AB (ref 145–400)
RBC: 3.54 10*6/uL — ABNORMAL LOW (ref 3.70–5.45)
RDW: 18.4 % — AB (ref 11.2–14.5)
WBC: 8.9 10*3/uL (ref 3.9–10.3)

## 2016-03-07 LAB — COMPREHENSIVE METABOLIC PANEL
ALT: 29 U/L (ref 0–55)
AST: 22 U/L (ref 5–34)
Albumin: 4.3 g/dL (ref 3.5–5.0)
Alkaline Phosphatase: 65 U/L (ref 40–150)
Anion Gap: 11 mEq/L (ref 3–11)
BUN: 14.1 mg/dL (ref 7.0–26.0)
CALCIUM: 9.9 mg/dL (ref 8.4–10.4)
CHLORIDE: 104 meq/L (ref 98–109)
CO2: 25 mEq/L (ref 22–29)
Creatinine: 0.9 mg/dL (ref 0.6–1.1)
EGFR: 71 mL/min/{1.73_m2} — AB (ref >90)
GLUCOSE: 116 mg/dL (ref 70–140)
POTASSIUM: 3.7 meq/L (ref 3.5–5.1)
SODIUM: 140 meq/L (ref 136–145)
Total Bilirubin: 0.62 mg/dL (ref 0.20–1.20)
Total Protein: 7.2 g/dL (ref 6.4–8.3)

## 2016-03-07 MED ORDER — HEPARIN SOD (PORK) LOCK FLUSH 100 UNIT/ML IV SOLN
500.0000 [IU] | Freq: Once | INTRAVENOUS | Status: AC | PRN
  Administered 2016-03-07: 500 [IU]
  Filled 2016-03-07: qty 5

## 2016-03-07 MED ORDER — DEXAMETHASONE SODIUM PHOSPHATE 100 MG/10ML IJ SOLN
10.0000 mg | Freq: Once | INTRAMUSCULAR | Status: AC
  Administered 2016-03-07: 10 mg via INTRAVENOUS
  Filled 2016-03-07: qty 1

## 2016-03-07 MED ORDER — SODIUM CHLORIDE 0.9 % IV SOLN
378.8000 mg | Freq: Once | INTRAVENOUS | Status: AC
  Administered 2016-03-07: 380 mg via INTRAVENOUS
  Filled 2016-03-07: qty 38

## 2016-03-07 MED ORDER — PALONOSETRON HCL INJECTION 0.25 MG/5ML
0.2500 mg | Freq: Once | INTRAVENOUS | Status: AC
  Administered 2016-03-07: 0.25 mg via INTRAVENOUS

## 2016-03-07 MED ORDER — SODIUM CHLORIDE 0.9 % IV SOLN
Freq: Once | INTRAVENOUS | Status: AC
  Administered 2016-03-07: 13:00:00 via INTRAVENOUS

## 2016-03-07 MED ORDER — SODIUM CHLORIDE 0.9% FLUSH
10.0000 mL | INTRAVENOUS | Status: DC | PRN
  Administered 2016-03-07: 10 mL
  Filled 2016-03-07: qty 10

## 2016-03-07 MED ORDER — SODIUM CHLORIDE 0.9 % IV SOLN
400.0000 mg/m2 | Freq: Once | INTRAVENOUS | Status: AC
  Administered 2016-03-07: 700 mg via INTRAVENOUS
  Filled 2016-03-07: qty 24

## 2016-03-07 MED ORDER — PALONOSETRON HCL INJECTION 0.25 MG/5ML
INTRAVENOUS | Status: AC
  Filled 2016-03-07: qty 5

## 2016-03-07 NOTE — Patient Instructions (Signed)
[facility] Discharge Instructions for Patients Receiving Chemotherapy  Today you received the following chemotherapy agents Alimta/Carboplatin  To help prevent nausea and vomiting after your treatment, we encourage you to take your nausea medication as prescribed.   If you develop nausea and vomiting that is not controlled by your nausea medication, call the clinic.   BELOW ARE SYMPTOMS THAT SHOULD BE REPORTED IMMEDIATELY:  *FEVER GREATER THAN 100.5 F  *CHILLS WITH OR WITHOUT FEVER  NAUSEA AND VOMITING THAT IS NOT CONTROLLED WITH YOUR NAUSEA MEDICATION  *UNUSUAL SHORTNESS OF BREATH  *UNUSUAL BRUISING OR BLEEDING  TENDERNESS IN MOUTH AND THROAT WITH OR WITHOUT PRESENCE OF ULCERS  *URINARY PROBLEMS  *BOWEL PROBLEMS  UNUSUAL RASH Items with * indicate a potential emergency and should be followed up as soon as possible.  Feel free to call the clinic you have any questions or concerns. The clinic phone number is [phone].  Please show the CHEMO ALERT CARD at check-in to the Emergency Department and triage nurse.   

## 2016-03-07 NOTE — Telephone Encounter (Signed)
Gave pt apt & avs °
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
## 2016-03-14 ENCOUNTER — Other Ambulatory Visit (HOSPITAL_BASED_OUTPATIENT_CLINIC_OR_DEPARTMENT_OTHER): Payer: PRIVATE HEALTH INSURANCE

## 2016-03-14 DIAGNOSIS — D61810 Antineoplastic chemotherapy induced pancytopenia: Secondary | ICD-10-CM

## 2016-03-14 DIAGNOSIS — D6481 Anemia due to antineoplastic chemotherapy: Secondary | ICD-10-CM

## 2016-03-14 DIAGNOSIS — C3491 Malignant neoplasm of unspecified part of right bronchus or lung: Secondary | ICD-10-CM

## 2016-03-14 DIAGNOSIS — T451X5A Adverse effect of antineoplastic and immunosuppressive drugs, initial encounter: Secondary | ICD-10-CM

## 2016-03-14 DIAGNOSIS — C342 Malignant neoplasm of middle lobe, bronchus or lung: Secondary | ICD-10-CM | POA: Diagnosis not present

## 2016-03-14 DIAGNOSIS — Z5111 Encounter for antineoplastic chemotherapy: Secondary | ICD-10-CM

## 2016-03-14 LAB — COMPREHENSIVE METABOLIC PANEL
ALBUMIN: 4.2 g/dL (ref 3.5–5.0)
ALK PHOS: 64 U/L (ref 40–150)
ALT: 52 U/L (ref 0–55)
AST: 41 U/L — AB (ref 5–34)
Anion Gap: 8 mEq/L (ref 3–11)
BILIRUBIN TOTAL: 0.69 mg/dL (ref 0.20–1.20)
BUN: 12.2 mg/dL (ref 7.0–26.0)
CALCIUM: 9.5 mg/dL (ref 8.4–10.4)
CO2: 28 mEq/L (ref 22–29)
Chloride: 104 mEq/L (ref 98–109)
Creatinine: 0.8 mg/dL (ref 0.6–1.1)
EGFR: 85 mL/min/{1.73_m2} — AB (ref >90)
GLUCOSE: 94 mg/dL (ref 70–140)
Potassium: 4.1 mEq/L (ref 3.5–5.1)
SODIUM: 139 meq/L (ref 136–145)
TOTAL PROTEIN: 7 g/dL (ref 6.4–8.3)

## 2016-03-14 LAB — CBC WITH DIFFERENTIAL/PLATELET
BASO%: 0 % (ref 0.0–2.0)
BASOS ABS: 0 10*3/uL (ref 0.0–0.1)
EOS ABS: 0 10*3/uL (ref 0.0–0.5)
EOS%: 0 % (ref 0.0–7.0)
HCT: 32.8 % — ABNORMAL LOW (ref 34.8–46.6)
HGB: 11 g/dL — ABNORMAL LOW (ref 11.6–15.9)
LYMPH%: 19.4 % (ref 14.0–49.7)
MCH: 32.4 pg (ref 25.1–34.0)
MCHC: 33.5 g/dL (ref 31.5–36.0)
MCV: 96.8 fL (ref 79.5–101.0)
MONO#: 0.8 10*3/uL (ref 0.1–0.9)
MONO%: 21.2 % — AB (ref 0.0–14.0)
NEUT#: 2.2 10*3/uL (ref 1.5–6.5)
NEUT%: 59.4 % (ref 38.4–76.8)
Platelets: 85 10*3/uL — ABNORMAL LOW (ref 145–400)
RBC: 3.39 10*6/uL — AB (ref 3.70–5.45)
RDW: 16.8 % — ABNORMAL HIGH (ref 11.2–14.5)
WBC: 3.8 10*3/uL — ABNORMAL LOW (ref 3.9–10.3)
lymph#: 0.7 10*3/uL — ABNORMAL LOW (ref 0.9–3.3)

## 2016-03-21 ENCOUNTER — Other Ambulatory Visit (HOSPITAL_BASED_OUTPATIENT_CLINIC_OR_DEPARTMENT_OTHER): Payer: PRIVATE HEALTH INSURANCE

## 2016-03-21 DIAGNOSIS — C342 Malignant neoplasm of middle lobe, bronchus or lung: Secondary | ICD-10-CM | POA: Diagnosis not present

## 2016-03-21 LAB — CBC WITH DIFFERENTIAL/PLATELET
BASO%: 0.1 % (ref 0.0–2.0)
Basophils Absolute: 0 10*3/uL (ref 0.0–0.1)
EOS%: 0.2 % (ref 0.0–7.0)
Eosinophils Absolute: 0 10*3/uL (ref 0.0–0.5)
HEMATOCRIT: 30.7 % — AB (ref 34.8–46.6)
HEMOGLOBIN: 10.3 g/dL — AB (ref 11.6–15.9)
LYMPH%: 18 % (ref 14.0–49.7)
MCH: 32.4 pg (ref 25.1–34.0)
MCHC: 33.6 g/dL (ref 31.5–36.0)
MCV: 96.4 fL (ref 79.5–101.0)
MONO#: 1.2 10*3/uL — AB (ref 0.1–0.9)
MONO%: 36.5 % — ABNORMAL HIGH (ref 0.0–14.0)
NEUT%: 45.2 % (ref 38.4–76.8)
NEUTROS ABS: 1.5 10*3/uL (ref 1.5–6.5)
PLATELETS: 94 10*3/uL — AB (ref 145–400)
RBC: 3.19 10*6/uL — ABNORMAL LOW (ref 3.70–5.45)
RDW: 19.3 % — AB (ref 11.2–14.5)
WBC: 3.3 10*3/uL — AB (ref 3.9–10.3)
lymph#: 0.6 10*3/uL — ABNORMAL LOW (ref 0.9–3.3)

## 2016-03-21 LAB — COMPREHENSIVE METABOLIC PANEL
ALT: 52 U/L (ref 0–55)
ANION GAP: 9 meq/L (ref 3–11)
AST: 37 U/L — AB (ref 5–34)
Albumin: 4.1 g/dL (ref 3.5–5.0)
Alkaline Phosphatase: 67 U/L (ref 40–150)
BILIRUBIN TOTAL: 0.65 mg/dL (ref 0.20–1.20)
BUN: 7.1 mg/dL (ref 7.0–26.0)
CALCIUM: 9.4 mg/dL (ref 8.4–10.4)
CHLORIDE: 102 meq/L (ref 98–109)
CO2: 29 meq/L (ref 22–29)
CREATININE: 0.8 mg/dL (ref 0.6–1.1)
EGFR: 79 mL/min/{1.73_m2} — ABNORMAL LOW (ref >90)
Glucose: 122 mg/dl (ref 70–140)
Potassium: 3.4 mEq/L — ABNORMAL LOW (ref 3.5–5.1)
Sodium: 140 mEq/L (ref 136–145)
TOTAL PROTEIN: 6.8 g/dL (ref 6.4–8.3)

## 2016-03-26 ENCOUNTER — Encounter: Payer: Self-pay | Admitting: Internal Medicine

## 2016-03-26 NOTE — Progress Notes (Signed)
left in box- left for dr. Julien Nordmann to sign

## 2016-03-27 ENCOUNTER — Encounter: Payer: Self-pay | Admitting: Pharmacist

## 2016-03-27 ENCOUNTER — Encounter: Payer: Self-pay | Admitting: Internal Medicine

## 2016-03-27 NOTE — Progress Notes (Signed)
left in box- left for dr. Julien Nordmann to sign- left mess to adv forms are ready for pick up at front desk-copy to medical records

## 2016-03-28 ENCOUNTER — Other Ambulatory Visit (HOSPITAL_BASED_OUTPATIENT_CLINIC_OR_DEPARTMENT_OTHER): Payer: PRIVATE HEALTH INSURANCE

## 2016-03-28 ENCOUNTER — Ambulatory Visit (HOSPITAL_BASED_OUTPATIENT_CLINIC_OR_DEPARTMENT_OTHER): Payer: PRIVATE HEALTH INSURANCE | Admitting: Internal Medicine

## 2016-03-28 ENCOUNTER — Ambulatory Visit (HOSPITAL_BASED_OUTPATIENT_CLINIC_OR_DEPARTMENT_OTHER): Payer: PRIVATE HEALTH INSURANCE

## 2016-03-28 ENCOUNTER — Encounter: Payer: Self-pay | Admitting: Internal Medicine

## 2016-03-28 VITALS — BP 134/82 | HR 89 | Temp 98.3°F | Resp 18 | Ht 65.0 in | Wt 148.3 lb

## 2016-03-28 DIAGNOSIS — C3491 Malignant neoplasm of unspecified part of right bronchus or lung: Secondary | ICD-10-CM

## 2016-03-28 DIAGNOSIS — C342 Malignant neoplasm of middle lobe, bronchus or lung: Secondary | ICD-10-CM | POA: Diagnosis not present

## 2016-03-28 DIAGNOSIS — D6481 Anemia due to antineoplastic chemotherapy: Secondary | ICD-10-CM

## 2016-03-28 DIAGNOSIS — C3431 Malignant neoplasm of lower lobe, right bronchus or lung: Secondary | ICD-10-CM | POA: Diagnosis not present

## 2016-03-28 DIAGNOSIS — Z5111 Encounter for antineoplastic chemotherapy: Secondary | ICD-10-CM

## 2016-03-28 DIAGNOSIS — T451X5A Adverse effect of antineoplastic and immunosuppressive drugs, initial encounter: Secondary | ICD-10-CM

## 2016-03-28 LAB — CBC WITH DIFFERENTIAL/PLATELET
BASO%: 0.2 % (ref 0.0–2.0)
Basophils Absolute: 0 10*3/uL (ref 0.0–0.1)
EOS%: 0 % (ref 0.0–7.0)
Eosinophils Absolute: 0 10*3/uL (ref 0.0–0.5)
HEMATOCRIT: 32.2 % — AB (ref 34.8–46.6)
HGB: 10.8 g/dL — ABNORMAL LOW (ref 11.6–15.9)
LYMPH#: 0.4 10*3/uL — AB (ref 0.9–3.3)
LYMPH%: 6 % — ABNORMAL LOW (ref 14.0–49.7)
MCH: 32.7 pg (ref 25.1–34.0)
MCHC: 33.4 g/dL (ref 31.5–36.0)
MCV: 97.8 fL (ref 79.5–101.0)
MONO#: 1.8 10*3/uL — ABNORMAL HIGH (ref 0.1–0.9)
MONO%: 27.1 % — AB (ref 0.0–14.0)
NEUT#: 4.3 10*3/uL (ref 1.5–6.5)
NEUT%: 66.7 % (ref 38.4–76.8)
Platelets: 122 10*3/uL — ABNORMAL LOW (ref 145–400)
RBC: 3.3 10*6/uL — ABNORMAL LOW (ref 3.70–5.45)
RDW: 19.4 % — AB (ref 11.2–14.5)
WBC: 6.5 10*3/uL (ref 3.9–10.3)

## 2016-03-28 LAB — COMPREHENSIVE METABOLIC PANEL
ALBUMIN: 4.1 g/dL (ref 3.5–5.0)
ALT: 29 U/L (ref 0–55)
ANION GAP: 10 meq/L (ref 3–11)
AST: 28 U/L (ref 5–34)
Alkaline Phosphatase: 70 U/L (ref 40–150)
BILIRUBIN TOTAL: 0.47 mg/dL (ref 0.20–1.20)
BUN: 6.7 mg/dL — ABNORMAL LOW (ref 7.0–26.0)
CALCIUM: 10 mg/dL (ref 8.4–10.4)
CO2: 28 mEq/L (ref 22–29)
CREATININE: 0.8 mg/dL (ref 0.6–1.1)
Chloride: 102 mEq/L (ref 98–109)
EGFR: 75 mL/min/{1.73_m2} — ABNORMAL LOW (ref >90)
Glucose: 113 mg/dl (ref 70–140)
Potassium: 3.8 mEq/L (ref 3.5–5.1)
SODIUM: 141 meq/L (ref 136–145)
TOTAL PROTEIN: 7.3 g/dL (ref 6.4–8.3)

## 2016-03-28 MED ORDER — PALONOSETRON HCL INJECTION 0.25 MG/5ML
0.2500 mg | Freq: Once | INTRAVENOUS | Status: AC
  Administered 2016-03-28: 0.25 mg via INTRAVENOUS

## 2016-03-28 MED ORDER — HEPARIN SOD (PORK) LOCK FLUSH 100 UNIT/ML IV SOLN
500.0000 [IU] | Freq: Once | INTRAVENOUS | Status: AC | PRN
  Administered 2016-03-28: 500 [IU]
  Filled 2016-03-28: qty 5

## 2016-03-28 MED ORDER — PALONOSETRON HCL INJECTION 0.25 MG/5ML
INTRAVENOUS | Status: AC
  Filled 2016-03-28: qty 5

## 2016-03-28 MED ORDER — CARBOPLATIN CHEMO INTRADERMAL TEST DOSE 100MCG/0.02ML
100.0000 ug | Freq: Once | INTRADERMAL | Status: AC
  Administered 2016-03-28: 100 ug via INTRADERMAL
  Filled 2016-03-28: qty 0.01

## 2016-03-28 MED ORDER — SODIUM CHLORIDE 0.9 % IV SOLN
400.0000 mg | Freq: Once | INTRAVENOUS | Status: AC
  Administered 2016-03-28: 400 mg via INTRAVENOUS
  Filled 2016-03-28: qty 40

## 2016-03-28 MED ORDER — SODIUM CHLORIDE 0.9 % IV SOLN
10.0000 mg | Freq: Once | INTRAVENOUS | Status: AC
  Administered 2016-03-28: 10 mg via INTRAVENOUS
  Filled 2016-03-28: qty 1

## 2016-03-28 MED ORDER — SODIUM CHLORIDE 0.9% FLUSH
10.0000 mL | INTRAVENOUS | Status: DC | PRN
  Administered 2016-03-28: 10 mL
  Filled 2016-03-28: qty 10

## 2016-03-28 MED ORDER — SODIUM CHLORIDE 0.9 % IV SOLN
Freq: Once | INTRAVENOUS | Status: AC
  Administered 2016-03-28: 13:00:00 via INTRAVENOUS

## 2016-03-28 MED ORDER — PEMETREXED DISODIUM CHEMO INJECTION 500 MG
400.0000 mg/m2 | Freq: Once | INTRAVENOUS | Status: AC
  Administered 2016-03-28: 700 mg via INTRAVENOUS
  Filled 2016-03-28: qty 28

## 2016-03-28 NOTE — Patient Instructions (Signed)
[facility] Discharge Instructions for Patients Receiving Chemotherapy  Today you received the following chemotherapy agents Alimta/Carboplatin  To help prevent nausea and vomiting after your treatment, we encourage you to take your nausea medication as prescribed.   If you develop nausea and vomiting that is not controlled by your nausea medication, call the clinic.   BELOW ARE SYMPTOMS THAT SHOULD BE REPORTED IMMEDIATELY:  *FEVER GREATER THAN 100.5 F  *CHILLS WITH OR WITHOUT FEVER  NAUSEA AND VOMITING THAT IS NOT CONTROLLED WITH YOUR NAUSEA MEDICATION  *UNUSUAL SHORTNESS OF BREATH  *UNUSUAL BRUISING OR BLEEDING  TENDERNESS IN MOUTH AND THROAT WITH OR WITHOUT PRESENCE OF ULCERS  *URINARY PROBLEMS  *BOWEL PROBLEMS  UNUSUAL RASH Items with * indicate a potential emergency and should be followed up as soon as possible.  Feel free to call the clinic you have any questions or concerns. The clinic phone number is [phone].  Please show the CHEMO ALERT CARD at check-in to the Emergency Department and triage nurse.   
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
## 2016-04-01 ENCOUNTER — Emergency Department (HOSPITAL_COMMUNITY): Payer: PRIVATE HEALTH INSURANCE

## 2016-04-01 ENCOUNTER — Telehealth: Payer: Self-pay | Admitting: *Deleted

## 2016-04-01 ENCOUNTER — Encounter (HOSPITAL_COMMUNITY): Payer: Self-pay | Admitting: Emergency Medicine

## 2016-04-01 ENCOUNTER — Observation Stay (HOSPITAL_COMMUNITY)
Admission: EM | Admit: 2016-04-01 | Discharge: 2016-04-02 | Disposition: A | Discharge status: Home / Self Care | Payer: PRIVATE HEALTH INSURANCE | Attending: Internal Medicine | Admitting: Internal Medicine

## 2016-04-01 DIAGNOSIS — E785 Hyperlipidemia, unspecified: Secondary | ICD-10-CM | POA: Diagnosis not present

## 2016-04-01 DIAGNOSIS — F418 Other specified anxiety disorders: Secondary | ICD-10-CM | POA: Diagnosis present

## 2016-04-01 DIAGNOSIS — I6789 Other cerebrovascular disease: Secondary | ICD-10-CM | POA: Insufficient documentation

## 2016-04-01 DIAGNOSIS — C342 Malignant neoplasm of middle lobe, bronchus or lung: Secondary | ICD-10-CM | POA: Diagnosis present

## 2016-04-01 DIAGNOSIS — D61818 Other pancytopenia: Secondary | ICD-10-CM | POA: Diagnosis present

## 2016-04-01 DIAGNOSIS — Z87891 Personal history of nicotine dependence: Secondary | ICD-10-CM | POA: Insufficient documentation

## 2016-04-01 DIAGNOSIS — Z79899 Other long term (current) drug therapy: Secondary | ICD-10-CM | POA: Insufficient documentation

## 2016-04-01 DIAGNOSIS — I10 Essential (primary) hypertension: Secondary | ICD-10-CM | POA: Diagnosis not present

## 2016-04-01 DIAGNOSIS — C7931 Secondary malignant neoplasm of brain: Secondary | ICD-10-CM | POA: Diagnosis not present

## 2016-04-01 DIAGNOSIS — R51 Headache: Secondary | ICD-10-CM

## 2016-04-01 DIAGNOSIS — H532 Diplopia: Secondary | ICD-10-CM | POA: Diagnosis present

## 2016-04-01 DIAGNOSIS — M1990 Unspecified osteoarthritis, unspecified site: Secondary | ICD-10-CM | POA: Diagnosis not present

## 2016-04-01 DIAGNOSIS — G936 Cerebral edema: Secondary | ICD-10-CM | POA: Diagnosis not present

## 2016-04-01 DIAGNOSIS — Z85118 Personal history of other malignant neoplasm of bronchus and lung: Secondary | ICD-10-CM | POA: Diagnosis not present

## 2016-04-01 DIAGNOSIS — F329 Major depressive disorder, single episode, unspecified: Secondary | ICD-10-CM | POA: Diagnosis not present

## 2016-04-01 DIAGNOSIS — R110 Nausea: Secondary | ICD-10-CM

## 2016-04-01 DIAGNOSIS — R519 Headache, unspecified: Secondary | ICD-10-CM | POA: Diagnosis present

## 2016-04-01 DIAGNOSIS — E871 Hypo-osmolality and hyponatremia: Secondary | ICD-10-CM

## 2016-04-01 LAB — BASIC METABOLIC PANEL
ANION GAP: 7 (ref 5–15)
BUN: 10 mg/dL (ref 6–20)
CALCIUM: 8.8 mg/dL — AB (ref 8.9–10.3)
CO2: 26 mmol/L (ref 22–32)
Chloride: 100 mmol/L — ABNORMAL LOW (ref 101–111)
Creatinine, Ser: 0.67 mg/dL (ref 0.44–1.00)
Glucose, Bld: 89 mg/dL (ref 65–99)
POTASSIUM: 3.9 mmol/L (ref 3.5–5.1)
Sodium: 133 mmol/L — ABNORMAL LOW (ref 135–145)

## 2016-04-01 LAB — CBC WITH DIFFERENTIAL/PLATELET
BASOS ABS: 0 10*3/uL (ref 0.0–0.1)
BASOS PCT: 0 %
Eosinophils Absolute: 0 10*3/uL (ref 0.0–0.7)
Eosinophils Relative: 0 %
HEMATOCRIT: 29.6 % — AB (ref 36.0–46.0)
Hemoglobin: 10.3 g/dL — ABNORMAL LOW (ref 12.0–15.0)
LYMPHS PCT: 26 %
Lymphs Abs: 0.6 10*3/uL — ABNORMAL LOW (ref 0.7–4.0)
MCH: 32.5 pg (ref 26.0–34.0)
MCHC: 34.8 g/dL (ref 30.0–36.0)
MCV: 93.4 fL (ref 78.0–100.0)
MONO ABS: 0.3 10*3/uL (ref 0.1–1.0)
Monocytes Relative: 11 %
NEUTROS ABS: 1.4 10*3/uL — AB (ref 1.7–7.7)
Neutrophils Relative %: 62 %
PLATELETS: 79 10*3/uL — AB (ref 150–400)
RBC: 3.17 MIL/uL — AB (ref 3.87–5.11)
RDW: 16.6 % — AB (ref 11.5–15.5)
WBC: 2.2 10*3/uL — AB (ref 4.0–10.5)

## 2016-04-01 MED ORDER — DIPHENHYDRAMINE HCL 50 MG/ML IJ SOLN
25.0000 mg | Freq: Once | INTRAMUSCULAR | Status: AC
  Administered 2016-04-01: 25 mg via INTRAVENOUS
  Filled 2016-04-01: qty 1

## 2016-04-01 MED ORDER — METOCLOPRAMIDE HCL 5 MG/ML IJ SOLN
10.0000 mg | Freq: Once | INTRAMUSCULAR | Status: AC
  Administered 2016-04-01: 10 mg via INTRAVENOUS
  Filled 2016-04-01: qty 2

## 2016-04-01 MED ORDER — LOSARTAN POTASSIUM-HCTZ 100-25 MG PO TABS: 1.0000 | ORAL_TABLET | Freq: Every day | ORAL | Status: DC

## 2016-04-01 MED ORDER — CITALOPRAM HYDROBROMIDE 20 MG PO TABS
20.0000 mg | ORAL_TABLET | Freq: Every day | ORAL | Status: DC
  Administered 2016-04-02: 20 mg via ORAL
  Filled 2016-04-01: qty 1
  Filled 2016-04-01: qty 2

## 2016-04-01 MED ORDER — VITAMIN D3 25 MCG (1000 UNIT) PO TABS
4000.0000 [IU] | ORAL_TABLET | Freq: Every day | ORAL | Status: DC
  Administered 2016-04-02: 4000 [IU] via ORAL
  Filled 2016-04-01: qty 4

## 2016-04-01 MED ORDER — ONDANSETRON HCL 4 MG PO TABS: 4.0000 mg | ORAL_TABLET | Freq: Four times a day (QID) | ORAL | Status: DC | PRN

## 2016-04-01 MED ORDER — DEXAMETHASONE SODIUM PHOSPHATE 4 MG/ML IJ SOLN
8.0000 mg | Freq: Four times a day (QID) | INTRAMUSCULAR | Status: DC
  Administered 2016-04-02: 8 mg via INTRAVENOUS
  Filled 2016-04-01: qty 2

## 2016-04-01 MED ORDER — FOLIC ACID 1 MG PO TABS
1.0000 mg | ORAL_TABLET | Freq: Every day | ORAL | Status: DC
  Administered 2016-04-02: 1 mg via ORAL
  Filled 2016-04-01: qty 1

## 2016-04-01 MED ORDER — SODIUM CHLORIDE 0.9% FLUSH
3.0000 mL | Freq: Two times a day (BID) | INTRAVENOUS | Status: DC
  Administered 2016-04-01: 3 mL via INTRAVENOUS

## 2016-04-01 MED ORDER — SACCHAROMYCES BOULARDII 250 MG PO CAPS
250.0000 mg | ORAL_CAPSULE | Freq: Every day | ORAL | Status: DC
  Administered 2016-04-02: 250 mg via ORAL
  Filled 2016-04-01: qty 1

## 2016-04-01 MED ORDER — ADULT MULTIVITAMIN W/MINERALS CH
1.0000 | ORAL_TABLET | Freq: Every day | ORAL | Status: DC
  Administered 2016-04-02: 1 via ORAL
  Filled 2016-04-01: qty 1

## 2016-04-01 MED ORDER — B COMPLEX-C PO TABS
1.0000 | ORAL_TABLET | Freq: Every day | ORAL | Status: DC
  Administered 2016-04-02: 1 via ORAL
  Filled 2016-04-01: qty 1

## 2016-04-01 MED ORDER — CALCIUM CARBONATE-VITAMIN D 500-200 MG-UNIT PO TABS
1.0000 | ORAL_TABLET | Freq: Every day | ORAL | Status: DC
  Administered 2016-04-02: 1 via ORAL
  Filled 2016-04-01: qty 1

## 2016-04-01 MED ORDER — SODIUM CHLORIDE 0.9 % IV BOLUS (SEPSIS)
1000.0000 mL | Freq: Once | INTRAVENOUS | Status: AC
  Administered 2016-04-01: 1000 mL via INTRAVENOUS

## 2016-04-01 MED ORDER — LIDOCAINE-PRILOCAINE 2.5-2.5 % EX CREA: 1.0000 "application " | TOPICAL_CREAM | CUTANEOUS | Status: DC | PRN

## 2016-04-01 MED ORDER — ACETAMINOPHEN 650 MG RE SUPP: 650.0000 mg | Freq: Four times a day (QID) | RECTAL | Status: DC | PRN

## 2016-04-01 MED ORDER — SODIUM CHLORIDE 0.9% FLUSH: 3.0000 mL | INTRAVENOUS | Status: DC | PRN

## 2016-04-01 MED ORDER — DIPHENHYDRAMINE HCL 50 MG/ML IJ SOLN: 25.0000 mg | Freq: Four times a day (QID) | INTRAMUSCULAR | Status: DC | PRN

## 2016-04-01 MED ORDER — HYDROMORPHONE HCL 1 MG/ML IJ SOLN: 1.0000 mg | INTRAMUSCULAR | Status: DC | PRN

## 2016-04-01 MED ORDER — BISACODYL 5 MG PO TBEC: 5.0000 mg | DELAYED_RELEASE_TABLET | Freq: Every day | ORAL | Status: DC | PRN

## 2016-04-01 MED ORDER — HYDROCODONE-ACETAMINOPHEN 5-325 MG PO TABS
1.0000 | ORAL_TABLET | ORAL | Status: DC | PRN
  Administered 2016-04-02 (×2): 2 via ORAL
  Filled 2016-04-01 (×2): qty 2

## 2016-04-01 MED ORDER — SODIUM CHLORIDE 0.9 % IV SOLN: 250.0000 mL | INTRAVENOUS | Status: DC | PRN

## 2016-04-01 MED ORDER — LOSARTAN POTASSIUM 50 MG PO TABS
100.0000 mg | ORAL_TABLET | Freq: Every day | ORAL | Status: DC
Start: 2016-04-02 — End: 2016-04-02
  Administered 2016-04-02: 100 mg via ORAL
  Filled 2016-04-01: qty 2

## 2016-04-01 MED ORDER — OMEGA-3-ACID ETHYL ESTERS 1 G PO CAPS
1.0000 g | ORAL_CAPSULE | Freq: Every day | ORAL | Status: DC
  Administered 2016-04-02: 1 g via ORAL
  Filled 2016-04-01: qty 1

## 2016-04-01 MED ORDER — HYDROCHLOROTHIAZIDE 25 MG PO TABS
25.0000 mg | ORAL_TABLET | Freq: Every day | ORAL | Status: DC
Start: 2016-04-02 — End: 2016-04-02
  Administered 2016-04-02: 25 mg via ORAL
  Filled 2016-04-01: qty 1

## 2016-04-01 MED ORDER — ACETAMINOPHEN 325 MG PO TABS: 650.0000 mg | ORAL_TABLET | Freq: Four times a day (QID) | ORAL | Status: DC | PRN

## 2016-04-01 MED ORDER — ONDANSETRON HCL 4 MG/2ML IJ SOLN: 4.0000 mg | Freq: Four times a day (QID) | INTRAMUSCULAR | Status: DC | PRN

## 2016-04-01 MED ORDER — GADOBENATE DIMEGLUMINE 529 MG/ML IV SOLN
15.0000 mL | Freq: Once | INTRAVENOUS | Status: AC | PRN
  Administered 2016-04-01: 13 mL via INTRAVENOUS

## 2016-04-01 MED ORDER — ZOLPIDEM TARTRATE 5 MG PO TABS: 5.0000 mg | ORAL_TABLET | Freq: Every evening | ORAL | Status: DC | PRN

## 2016-04-01 MED ORDER — DEXAMETHASONE SODIUM PHOSPHATE 10 MG/ML IJ SOLN
20.0000 mg | Freq: Once | INTRAMUSCULAR | Status: AC
  Administered 2016-04-01: 20 mg via INTRAVENOUS
  Filled 2016-04-01: qty 2

## 2016-04-01 MED ORDER — POLYETHYLENE GLYCOL 3350 17 G PO PACK: 17.0000 g | PACK | Freq: Every day | ORAL | Status: DC | PRN

## 2016-04-01 NOTE — ED Notes (Signed)
In MRI
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
## 2016-04-01 NOTE — ED Notes (Signed)
Pt reports HA since Friday. Pt had chemo on Thursday. Has had HA with this chemo treatment before, but unsure if it was related to nausea medication. BP 142/107. Pt denies CP or SOB. Took home tramadol prior to arrival.

## 2016-04-01 NOTE — Telephone Encounter (Signed)
Pt called with complain of headache and N/V started on Friday 6/30. Last Chemo Carbo/Alimta 6/29. Pt reports taking zofran and tramadol with no relief to symptoms. Pt able to eat yorgurt this am and drink sips of water. No vomiting since early this am.  Reviewed with MD, VO for stat Brain MRI. Upon results MD will decide how to treat.  Notified pt to expect call from scheduling regarding MRI appt. Pt verbalized understanding.

## 2016-04-01 NOTE — ED Notes (Signed)
Spoke with daughter Jinny Blossom [phone])  Informed about pt  Privacy.  Had daughter call her mother and transferred to room.

## 2016-04-01 NOTE — Telephone Encounter (Signed)
Pt returned call to advise she has not heard from central scheduling regarding MRI appt.  Pt states "my vision isnt very good, I'm having trouble seeing the numbers on the phone. It took me 3 times to dial your number. My husband came home at lunch around 115pm and took BP. It was 160/111." Reviewed with MD instructed pt to have husband or neighbor take to ED if unavailable call 911 as she needs to be further evaluated. Pt  Verbalized understanding. Walked MRI order over to [place] in managed Care for prior authorization.
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
## 2016-04-01 NOTE — ED Notes (Signed)
20 min timer started ( called floor )

## 2016-04-01 NOTE — ED Notes (Addendum)
hospital ist in room ( waiting to give meds until visit is finished)

## 2016-04-02 DIAGNOSIS — G936 Cerebral edema: Secondary | ICD-10-CM | POA: Diagnosis not present

## 2016-04-02 DIAGNOSIS — C3490 Malignant neoplasm of unspecified part of unspecified bronchus or lung: Secondary | ICD-10-CM | POA: Diagnosis not present

## 2016-04-02 DIAGNOSIS — I10 Essential (primary) hypertension: Secondary | ICD-10-CM

## 2016-04-02 DIAGNOSIS — C7931 Secondary malignant neoplasm of brain: Secondary | ICD-10-CM | POA: Diagnosis not present

## 2016-04-02 DIAGNOSIS — R51 Headache: Secondary | ICD-10-CM

## 2016-04-02 DIAGNOSIS — H532 Diplopia: Secondary | ICD-10-CM | POA: Diagnosis not present

## 2016-04-02 DIAGNOSIS — D61818 Other pancytopenia: Secondary | ICD-10-CM

## 2016-04-02 DIAGNOSIS — C342 Malignant neoplasm of middle lobe, bronchus or lung: Secondary | ICD-10-CM

## 2016-04-02 DIAGNOSIS — R519 Headache, unspecified: Secondary | ICD-10-CM | POA: Insufficient documentation

## 2016-04-02 LAB — BASIC METABOLIC PANEL
Anion gap: 8 (ref 5–15)
BUN: 11 mg/dL (ref 6–20)
CALCIUM: 8.8 mg/dL — AB (ref 8.9–10.3)
CHLORIDE: 107 mmol/L (ref 101–111)
CO2: 25 mmol/L (ref 22–32)
CREATININE: 0.52 mg/dL (ref 0.44–1.00)
GFR calc Af Amer: >60 mL/min (ref >60)
GFR calc non Af Amer: >60 mL/min (ref >60)
Glucose, Bld: 137 mg/dL — ABNORMAL HIGH (ref 65–99)
Potassium: 4.2 mmol/L (ref 3.5–5.1)
Sodium: 140 mmol/L (ref 135–145)

## 2016-04-02 LAB — GLUCOSE, CAPILLARY: GLUCOSE-CAPILLARY: 149 mg/dL — AB (ref 65–99)

## 2016-04-02 LAB — PROTIME-INR
INR: 1.16 (ref 0.00–1.49)
PROTHROMBIN TIME: 14.5 s (ref 11.6–15.2)

## 2016-04-02 MED ORDER — PANTOPRAZOLE SODIUM 40 MG PO TBEC: 40.0000 mg | DELAYED_RELEASE_TABLET | Freq: Every day | ORAL | Status: AC

## 2016-04-02 MED ORDER — DEXAMETHASONE 4 MG PO TABS: ORAL_TABLET | ORAL | Status: DC

## 2016-04-02 MED ORDER — INSULIN ASPART 100 UNIT/ML ~~LOC~~ SOLN: 0.0000 [IU] | Freq: Every day | SUBCUTANEOUS | Status: DC

## 2016-04-02 MED ORDER — INSULIN ASPART 100 UNIT/ML ~~LOC~~ SOLN
0.0000 [IU] | Freq: Three times a day (TID) | SUBCUTANEOUS | Status: DC
  Administered 2016-04-02: 1 [IU] via SUBCUTANEOUS

## 2016-04-02 MED ORDER — HEPARIN SOD (PORK) LOCK FLUSH 100 UNIT/ML IV SOLN
500.0000 [IU] | INTRAVENOUS | Status: AC | PRN
  Administered 2016-04-02: 500 [IU]
  Filled 2016-04-02: qty 5

## 2016-04-02 NOTE — Progress Notes (Signed)
Tracy Dodson   DOB:10-Nov-1950   XV#:[id number]   JKD#:[id number]  ONCOLOGY FOLLOW UP NOTE   Subjective: I am covering my partner Dr. Julien Nordmann to see the patient. She presented emergency room with worsening headaches 4-5 days, and visual disturbance. Brain MRI showed a 2.5 cm solitary right cervical metastasis with slight hemorrhage and central necrosis. She was started on steroids dexamethasone, and responded very well. Her headaches are nearly resolved, and she is normal now, she slept well last night, no other complains.    Objective:  Filed Vitals:   04/01/16 2340 04/02/16 0552  BP: 123/80 127/87  Pulse: 102 89  Temp: 98.1 F (36.7 C) 98 F (36.7 C)  Resp: 17 17    Body mass index is 23.22 kg/(m^2).  Intake/Output Summary (Last 24 hours) at 04/02/16 0952 Last data filed at 04/02/16 0500  Gross per 24 hour  Intake    243 ml  Output    800 ml  Net   -557 ml     Sclerae unicteric  Oropharynx clear  No peripheral adenopathy  Lungs clear -- no rales or rhonchi  Heart regular rate and rhythm  Abdomen benign  MSK no focal spinal tenderness, no peripheral edema  Neuro nonfocal   CBG (last 3)   Recent Labs  04/02/16 0751  GLUCAP 149*     Labs:  Lab Results  Component Value Date   WBC 2.2* 04/01/2016   HGB 10.3* 04/01/2016   HCT 29.6* 04/01/2016   MCV 93.4 04/01/2016   PLT 79* 04/01/2016   NEUTROABS 1.4* 04/01/2016    '@LASTCHEMISTRY'$ @  Urine Studies No results for input(s): UHGB, CRYS in the last 72 hours.  Invalid input(s): UACOL, UAPR, USPG, UPH, UTP, UGL, UKET, UBIL, UNIT, UROB, ULEU, UEPI, UWBC, URBC, UBAC, CAST, UCOM, BILUA  Basic Metabolic Panel:  Recent Labs Lab 03/28/16 1125 04/01/16 1728 04/02/16 0406  NA 141 133* 140  K 3.8 3.9 4.2  CL  --  100* 107  CO2 '28 26 25  '$ GLUCOSE 113 89 137*  BUN 6.7* 10 11  CREATININE 0.8 0.67 0.52  CALCIUM 10.0 8.8* 8.8*   GFR Estimated Creatinine Clearance: 70.9 mL/min (by C-G formula based on Cr of  0.52). Liver Function Tests:  Recent Labs Lab 03/28/16 1125  AST 28  ALT 29  ALKPHOS 70  BILITOT 0.47  PROT 7.3  ALBUMIN 4.1   No results for input(s): LIPASE, AMYLASE in the last 168 hours. No results for input(s): AMMONIA in the last 168 hours. Coagulation profile  Recent Labs Lab 04/02/16 0406  INR 1.16    CBC:  Recent Labs Lab 03/28/16 1125 04/01/16 1728  WBC 6.5 2.2*  NEUTROABS 4.3 1.4*  HGB 10.8* 10.3*  HCT 32.2* 29.6*  MCV 97.8 93.4  PLT 122* 79*   Cardiac Enzymes: No results for input(s): CKTOTAL, CKMB, CKMBINDEX, TROPONINI in the last 168 hours. BNP: Invalid input(s): POCBNP CBG:  Recent Labs Lab 04/02/16 0751  GLUCAP 149*   D-Dimer No results for input(s): DDIMER in the last 72 hours. Hgb A1c No results for input(s): HGBA1C in the last 72 hours. Lipid Profile No results for input(s): CHOL, HDL, LDLCALC, TRIG, CHOLHDL, LDLDIRECT in the last 72 hours. Thyroid function studies No results for input(s): TSH, T4TOTAL, T3FREE, THYROIDAB in the last 72 hours.  Invalid input(s): FREET3 Anemia work up No results for input(s): VITAMINB12, FOLATE, FERRITIN, TIBC, IRON, RETICCTPCT in the last 72 hours. Microbiology No results found for this or any previous visit (  from the past 240 hour(s)).    Studies:  Mr Kizzie Fantasia Contrast  04/01/2016  CLINICAL DATA:  History of non-small-cell lung cancer diagnosed in 2004, now with severe headache which began 3 days ago. EXAM: MRI HEAD WITHOUT AND WITH CONTRAST TECHNIQUE: Multiplanar, multiecho pulse sequences of the brain and surrounding structures were obtained without and with intravenous contrast. CONTRAST:  MultiHance 13 mL. COMPARISON:  MR brain from 2014. FINDINGS: No evidence for acute stroke, hydrocephalus, or extra-axial fluid. Premature atrophy. Nonspecific periventricular white matter signal abnormality on the LEFT, likely post treatment effect or early small vessel disease. Large RIGHT occipital mass,  peripherally located but intra-axial, RIGHT calcarine cortex, with marked associated vasogenic edema. Measurements on postcontrast T1 weighted images are 22 x 25 x 24 mm. No other similar lesions. Susceptibility within the lesion, as seen on gradient sequence, more likely represent acute hemorrhage than mineralization. Mild asymmetric enhancement, of the RIGHT tentorium versus the normal LEFT. The metastatic lesion abuts the posterior RIGHT tentorium at its junction with the falx. This enhancement could represent pachymeningeal disease, increased vascularity, or venous congestion. No definite osseous lesions. Flow voids are maintained. Major dural venous sinuses are patent. Negative orbits, sinuses, and mastoids. No extracranial soft tissue abnormalities. Compared with 2014, no intra-axial lesions were present IMPRESSION: Greater than 2 cm solitary RIGHT occipital metastasis, with slight hemorrhage and central necrosis. Marked associated vasogenic edema. RIGHT tentorial prominence, uncertain significance. These results were called by telephone at the time of interpretation on 04/01/2016 at 9:32 pm to Dr. Lonia Skinner , who verbally acknowledged these results. Electronically Signed   By: Staci Righter M.D.   On: 04/01/2016 21:40    Assessment: 65 y.o. with stage IV non-small cell lung cancer, currently on chemotherapy  1. New onset solitary right occipital brain metastasis with slight hemorrhage and necrosis 2. 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 HTN   Plan:  -she has responded very well to high dose dexamethasone. -I spoke with radiation oncologist Dr. Lisbeth Renshaw today, who will see her tomorrow in the clinic to consider SBRT.  -She can be discharged home with dexamethasone 8 mg twice daily, consider PPI for prolonged steroids therapy  -monitor BP at home, she will restart her HTN meds if BP>140/90 -Avoid aspirin OR blood thinner due to her mild  hemorrhage in the brain metastasis -I'll inform her primary oncologist Dr. Julien Nordmann.   Truitt Merle, MD 04/02/2016  9:52 AM

## 2016-04-02 NOTE — Progress Notes (Signed)
Nursing Discharge Summary  Patient ID: Tracy Dodson MRN: [id number] DOB/AGE: 05-Dec-1950 83 y.o.  Admit date: 04/01/2016 Discharge date: 04/02/2016  Discharged Condition: good  Disposition: 01-Home or Self Care  Follow-up Information    Follow up with [facility] K., MD. Schedule an appointment as soon as possible for a visit in 1 week.   Specialty:  Oncology   Contact information:   [address] [place] [address] [phone]       Follow up with Kyung Rudd, MD On 04/03/2016.   Specialty:  Radiation Oncology   Contact information:   [address] [place] [address] [phone]       Prescriptions Given: Prescriptions given for Decadron and Protonix.  Patient follow up appointments and medications discussed.  Patient and husband verbalized understanding.    Means of Discharge: Patient to be taken downstairs via wheelchair to be discharged home via private vehicle.  Signed: Buel Ream 04/02/2016, 11:05 AM

## 2016-04-02 NOTE — Discharge Summary (Signed)
Physician Discharge Summary  Tracy Dodson ASN:[id number] DOB: [birth year] DOA: 04/01/2016  PCP:  [name], MD  Admit date: 04/01/2016 Discharge date: 04/02/2016  Admitted From: Home Disposition:  Home  Recommendations for Outpatient Follow-up:  Discharge home with follow-up with radiation oncology tomorrow. Follow-up with Dr. Julien Nordmann within one week.  Home Health: no Equipment/Devices: None  Discharge Condition: Stable CODE STATUS: Full code Diet recommendation: Regular    Discharge Diagnoses:  Principal Problem:   Vasogenic brain edema (HCC)   Active Problems:   Primary cancer of right middle lobe of lung (HCC)   Brain metastasis (HCC)   Pancytopenia (HCC)   Headache   Nausea   Hyponatremia   Hypertension   Depression with anxiety   Diplopia  Brief narrative/history of present illness 65 y.o. female with medical history significant for depression, anxiety, hypertension, and lung cancer status post lobectomy currently on chemotherapy who presents the emergency department for evaluation of headache with nausea and vision change. Patient reports pain in her usual state of health when she received chemotherapy with carboplatin and Alimta on 03/28/2016. The following day, she developed headache and nausea, which she reports she's developed in the past following these infusions. Headache was more severe than previous, and the patient also developed change in her vision today. Headache is reported as severe, unrelenting, generalized, throbbing, and worse with movement. Patient noted some blurred vision today, and describes difficulty determining where sound is coming from, explaining that she heard someone speaking to her, but did not know which direction to look. Patient raised these concerns with her oncology office and there were arrangements being made for outpatient MRI. With her symptoms continued to worsen, patient came into the emergency department for evaluation.  ED  Course: Upon arrival to the ED, patient is found to be afebrile, saturating well on room air, and with vital signs stable. BMP is notable for hyponatremia with serum sodium of 133. CBC features of pancytopenia with WBC 2200, hemoglobin 10.3, and platelet count 79,000. MRI brain is obtained and notable for a right occipital metastasis measuring greater than 2 cm and with slight hemorrhage and central necrosis. There is marked vasogenic edema associated with the metastasis. Oncology was consulted by the ED physician. Medical admission and treatment with IV Decadron was advised. Patient was given a 1 L bolus of normal saline, Reglan, Zofran, and 20 mg IV Decadron in the emergency department. She remained hemodynamically stable and will be observed on the medical-surgical unit for ongoing evaluation and management of cerebral metastasis with marked vasogenic edema.  Hospital course   1. Brain metastasis with vasogenic edema  - >2 cm solitary met noted in right occiput with marked vasogenic edema  Appreciate oncology recommendations. Resolved after starting decadron, will discharge her on same with plan on outpt radiation oncology follow up. She will follow up with Dr Julien Nordmann within 1 week.  2. Lung cancer - Underwent right middle lobectomy and wedge resection of superior segment right lower lobe in 2014  - Previous treatments with Tarceva and Gilotrif stopped due to disease progression  - Currently managed with carboplatin and Alimta, had 5th infusion on 03/28/16  -follow up with Dr Julien Nordmann  3. Headache, N/V, vision change  - secondary to the brain metastasis -resolved after initiating decadron.  4. Hypertension  Resume home meds.  5. Hyponatremia  Mild, improved with fluids.  6. Depression/ anxiety  - Stable    7. Pancytopenia  Secondary to chemo. Follow as outpt  Discharge Instructions     Medication List    STOP taking these medications         HYDROcodone-homatropine 5-1.5 MG/5ML syrup  Commonly known as:  HYCODAN     potassium chloride SA 20 MEQ tablet  Commonly known as:  K-DUR,KLOR-CON     predniSONE 1 MG tablet  Commonly known as:  DELTASONE      TAKE these medications        acetaminophen 325 MG tablet  Commonly known as:  TYLENOL  Take 325 mg by mouth every 6 (six) hours as needed for moderate pain or headache.     b complex vitamins tablet  Take 1 tablet by mouth daily.     BIOTIN 5000 PO  Take 10,000 mcg by mouth daily.     CALCIUM 600+D 600-200 MG-UNIT Tabs  Generic drug:  Calcium Carbonate-Vitamin D  Take 1 tablet by mouth daily.     CENTRUM SILVER PO  Take 1 capsule by mouth daily.     cholecalciferol 1000 units tablet  Commonly known as:  VITAMIN D  Take 4,000 Units by mouth daily.     citalopram 20 MG tablet  Commonly known as:  CELEXA  Take 20 mg by mouth daily.     dexamethasone 4 MG tablet  Commonly known as:  DECADRON  8 mg by mouth twice a day after meals     Fish Oil 1000 MG Caps  Take 1 capsule by mouth daily.     FLORASTOR PO  Take 1 capsule by mouth daily.     folic acid 1 MG tablet  Commonly known as:  FOLVITE  Take 1 tablet (1 mg total) by mouth daily.     lidocaine-prilocaine cream  Commonly known as:  EMLA  Apply 1 application topically as needed.     losartan-hydrochlorothiazide 100-25 MG tablet  Commonly known as:  HYZAAR  Take 1 tablet by mouth daily.     Melatonin 5 MG Tabs  Take 5 mg by mouth at bedtime as needed.     Milk Thistle 1000 MG Caps  Take 1 capsule by mouth daily.     ondansetron 8 MG tablet  Commonly known as:  ZOFRAN  Take 1 tablet (8 mg total) by mouth every 8 (eight) hours as needed for nausea or vomiting.     pantoprazole 40 MG tablet  Commonly known as:  PROTONIX  Take 1 tablet (40 mg total) by mouth daily. Switch for any other PPI at similar dose and frequency     traMADol 50 MG tablet  Commonly known as:  ULTRAM  Take 1 tablet (50  mg total) by mouth every 6 (six) hours as needed.           Follow-up Information    Follow up with [facility] K., MD. Schedule an appointment as soon as possible for a visit in 1 week.   Specialty:  Oncology   Contact information:   [address] [place] [address] [phone]       Follow up with Dorothy Puffer, MD On 04/03/2016.   Specialty:  Radiation Oncology   Contact information:   [address] [place] [address] [phone]      Allergies  Allergen Reactions  . Compazine [Prochlorperazine Edisylate] Other (See Comments)    Mental status changes -confusion   . Contrast Media [Iodinated Diagnostic Agents] Rash    Looked like I had the measles. Red spotted rash. Pt.states from PET scan Per  pt ---no allergy to IV contrast media  02/29/16    Consultations:  Oncology (Dr. Burr Medico)   Procedures/Studies: Mr Jeri Cos Wo Contrast  04/09/16  CLINICAL DATA:  History of non-small-cell lung cancer diagnosed in 2004, now with severe headache which began 3 days ago. EXAM: MRI HEAD WITHOUT AND WITH CONTRAST TECHNIQUE: Multiplanar, multiecho pulse sequences of the brain and surrounding structures were obtained without and with intravenous contrast. CONTRAST:  MultiHance 13 mL. COMPARISON:  MR brain from 2014. FINDINGS: No evidence for acute stroke, hydrocephalus, or extra-axial fluid. Premature atrophy. Nonspecific periventricular white matter signal abnormality on the LEFT, likely post treatment effect or early small vessel disease. Large RIGHT occipital mass, peripherally located but intra-axial, RIGHT calcarine cortex, with marked associated vasogenic edema. Measurements on postcontrast T1 weighted images are 22 x 25 x 24 mm. No other similar lesions. Susceptibility within the lesion, as seen on gradient sequence, more likely represent acute hemorrhage than mineralization. Mild asymmetric enhancement, of the RIGHT tentorium versus the normal LEFT. The metastatic lesion abuts  the posterior RIGHT tentorium at its junction with the falx. This enhancement could represent pachymeningeal disease, increased vascularity, or venous congestion. No definite osseous lesions. Flow voids are maintained. Major dural venous sinuses are patent. Negative orbits, sinuses, and mastoids. No extracranial soft tissue abnormalities. Compared with 2014, no intra-axial lesions were present IMPRESSION: Greater than 2 cm solitary RIGHT occipital metastasis, with slight hemorrhage and central necrosis. Marked associated vasogenic edema. RIGHT tentorial prominence, uncertain significance. These results were called by telephone at the time of interpretation on 09-Apr-2016 at 9:32 pm to Dr. Lonia Skinner , who verbally acknowledged these results. Electronically Signed   By: Staci Righter M.D.   On: 04-09-16 21:40       Subjective: No further diplopia. Headache improved. No weakness or numbness.  Discharge Exam: Filed Vitals:   04-09-2016 2340 04/02/16 0552  BP: 123/80 127/87  Pulse: 102 89  Temp: 98.1 F (36.7 C) 98 F (36.7 C)  Resp: 17 17   Filed Vitals:   2016-04-09 2030 04-09-16 2319 2016/04/09 2340 04/02/16 0552  BP: 133/90 133/99 123/80 127/87  Pulse: 99 99 102 89  Temp:  98.1 F (36.7 C) 98.1 F (36.7 C) 98 F (36.7 C)  TempSrc:  Oral Oral Oral  Resp:  '20 17 17  '$ Height:   '5\' 7"'$  (1.702 m)   Weight:   67.268 kg (148 lb 4.8 oz)   SpO2: 98% 98% 96% 96%    General: Middle aged female not in distress  HEENT: No pallor, moist mucosa, pupils reactive bilaterally with normal extraocular muscles Cardiovascular: RRR, S1/S2 +, no murmurs Respiratory: CTA bilaterally, no wheezing, no rhonchi Abdominal: Soft, NT, ND, bowel sounds + Extremities: no edema, no cyanosis CNS: Alert and oriented, nonfocal    The results of significant diagnostics from this hospitalization (including imaging, microbiology, ancillary and laboratory) are listed below for reference.     Microbiology: No results  found for this or any previous visit (from the past 240 hour(s)).   Labs: BNP (last 3 results) No results for input(s): BNP in the last 8760 hours. Basic Metabolic Panel:  Recent Labs Lab 03/28/16 1125 April 09, 2016 1728 04/02/16 0406  NA 141 133* 140  K 3.8 3.9 4.2  CL  --  100* 107  CO2 '28 26 25  '$ GLUCOSE 113 89 137*  BUN 6.7* 10 11  CREATININE 0.8 0.67 0.52  CALCIUM 10.0 8.8* 8.8*   Liver Function Tests:  Recent Labs Lab  03/28/16 1125  AST 28  ALT 29  ALKPHOS 70  BILITOT 0.47  PROT 7.3  ALBUMIN 4.1   No results for input(s): LIPASE, AMYLASE in the last 168 hours. No results for input(s): AMMONIA in the last 168 hours. CBC:  Recent Labs Lab 03/28/16 1125 04/01/16 1728  WBC 6.5 2.2*  NEUTROABS 4.3 1.4*  HGB 10.8* 10.3*  HCT 32.2* 29.6*  MCV 97.8 93.4  PLT 122* 79*   Cardiac Enzymes: No results for input(s): CKTOTAL, CKMB, CKMBINDEX, TROPONINI in the last 168 hours. BNP: Invalid input(s): POCBNP CBG:  Recent Labs Lab 04/02/16 0751  GLUCAP 149*   D-Dimer No results for input(s): DDIMER in the last 72 hours. Hgb A1c No results for input(s): HGBA1C in the last 72 hours. Lipid Profile No results for input(s): CHOL, HDL, LDLCALC, TRIG, CHOLHDL, LDLDIRECT in the last 72 hours. Thyroid function studies No results for input(s): TSH, T4TOTAL, T3FREE, THYROIDAB in the last 72 hours.  Invalid input(s): FREET3 Anemia work up No results for input(s): VITAMINB12, FOLATE, FERRITIN, TIBC, IRON, RETICCTPCT in the last 72 hours. Urinalysis    Component Value Date/Time   COLORURINE YELLOW 02/04/2013 [place] 02/04/2013 1033   LABSPEC 1.007 02/04/2013 1033   PHURINE 7.0 02/04/2013 1033   GLUCOSEU NEGATIVE 02/04/2013 1033   HGBUR NEGATIVE 02/04/2013 [place] 02/04/2013 [place] 02/04/2013 1033   PROTEINUR NEGATIVE 02/04/2013 1033   UROBILINOGEN 0.2 02/04/2013 1033   NITRITE NEGATIVE 02/04/2013 1033    LEUKOCYTESUR TRACE* 02/04/2013 1033   Sepsis Labs Invalid input(s): PROCALCITONIN,  WBC,  LACTICIDVEN Microbiology No results found for this or any previous visit (from the past 240 hour(s)).   Time coordinating discharge: < 30 minutes  SIGNED:   Louellen Molder, MD  [facility] 04/02/2016, 10:43 AM Pager   If 7PM-7AM, please contact night-coverage [email or web address] Password [email or web address]

## 2016-04-03 ENCOUNTER — Other Ambulatory Visit: Payer: Self-pay | Admitting: Radiation Therapy

## 2016-04-03 ENCOUNTER — Other Ambulatory Visit: Payer: Self-pay | Admitting: Internal Medicine

## 2016-04-03 ENCOUNTER — Encounter: Payer: Self-pay | Admitting: Radiation Therapy

## 2016-04-03 ENCOUNTER — Other Ambulatory Visit: Payer: Self-pay | Admitting: Radiation Oncology

## 2016-04-03 DIAGNOSIS — C342 Malignant neoplasm of middle lobe, bronchus or lung: Secondary | ICD-10-CM

## 2016-04-03 DIAGNOSIS — C7931 Secondary malignant neoplasm of brain: Secondary | ICD-10-CM

## 2016-04-03 DIAGNOSIS — C7949 Secondary malignant neoplasm of other parts of nervous system: Principal | ICD-10-CM

## 2016-04-03 MED ORDER — LORAZEPAM 0.5 MG PO TABS: ORAL_TABLET | ORAL | Status: DC

## 2016-04-03 NOTE — Progress Notes (Addendum)
1.  Do you need a wheel chair?    no  2. On oxygen? no  3. Have you ever had any surgery in the body part being scanned? no  4. Have you ever had any surgery on your brain or heart?   no                               5. Have you ever had surgery on your eyes or ears?  no                                   6. Do you have a pacemaker or defibrillator?  no   7. Do you have a Neurostimulator?  no 8. Claustrophobic?  No, but has requested some Ativan to help relax her during the scan. I will reach out to Dr. Lisbeth Renshaw about this.   9. Any risk for metal in eyes?  no  10. Injury by bullet, buckshot, or shrapnel?  no  11. Stent?   no                                                                             12. Hx of Cancer?  Yes, Lung Cancer with a met to the brain                                                                                                           13. Kidney or Liver disease?  no  14. Hx of Lupus, Rheumatoid Arthritis or Scleroderma?  no  15. IV Antibiotics or long term use of NSAIDS?  no  16. HX of Hypertension?  yes  17. Diabetes?  no  18. Allergy to contrast?  Stated that she broke out in a rash a few days after her PET scan, but no issues with MRI or CT contrast per pt   19. Recent labs. Labs to be drawn on 7/6 - will be in [facility]   Questions answered over the phone by pt.   Pt has had previous radiation to her chest under the care of [name] and Dr. Julien Nordmann here at [facility]. Radiation treatment dates: 10/20/12 - 11/27/12)   Mont Dutton R.T.(R)(T) Special Procedures Navigator

## 2016-04-04 ENCOUNTER — Other Ambulatory Visit: Payer: Self-pay | Admitting: Neurosurgery

## 2016-04-04 ENCOUNTER — Inpatient Hospital Stay: Admission: RE | Admit: 2016-04-04 | Payer: PRIVATE HEALTH INSURANCE | Source: Ambulatory Visit

## 2016-04-04 ENCOUNTER — Other Ambulatory Visit (HOSPITAL_BASED_OUTPATIENT_CLINIC_OR_DEPARTMENT_OTHER): Payer: PRIVATE HEALTH INSURANCE

## 2016-04-04 DIAGNOSIS — C342 Malignant neoplasm of middle lobe, bronchus or lung: Secondary | ICD-10-CM | POA: Diagnosis not present

## 2016-04-04 LAB — COMPREHENSIVE METABOLIC PANEL
ALBUMIN: 4.3 g/dL (ref 3.5–5.0)
ALK PHOS: 66 U/L (ref 40–150)
ALT: 59 U/L — AB (ref 0–55)
ANION GAP: 11 meq/L (ref 3–11)
AST: 37 U/L — ABNORMAL HIGH (ref 5–34)
BILIRUBIN TOTAL: 0.66 mg/dL (ref 0.20–1.20)
BUN: 10.9 mg/dL (ref 7.0–26.0)
CO2: 28 mEq/L (ref 22–29)
CREATININE: 0.8 mg/dL (ref 0.6–1.1)
Calcium: 9.6 mg/dL (ref 8.4–10.4)
Chloride: 101 mEq/L (ref 98–109)
EGFR: 79 mL/min/{1.73_m2} — AB (ref >90)
Glucose: 110 mg/dl (ref 70–140)
Potassium: 3.5 mEq/L (ref 3.5–5.1)
Sodium: 140 mEq/L (ref 136–145)
TOTAL PROTEIN: 7.1 g/dL (ref 6.4–8.3)

## 2016-04-04 LAB — TECHNOLOGIST REVIEW

## 2016-04-04 LAB — CBC WITH DIFFERENTIAL/PLATELET
BASO%: 0.3 % (ref 0.0–2.0)
Basophils Absolute: 0 10*3/uL (ref 0.0–0.1)
EOS ABS: 0 10*3/uL (ref 0.0–0.5)
EOS%: 0 % (ref 0.0–7.0)
HEMATOCRIT: 31.8 % — AB (ref 34.8–46.6)
HEMOGLOBIN: 10.6 g/dL — AB (ref 11.6–15.9)
LYMPH%: 6.9 % — AB (ref 14.0–49.7)
MCH: 32.8 pg (ref 25.1–34.0)
MCHC: 33.5 g/dL (ref 31.5–36.0)
MCV: 98 fL (ref 79.5–101.0)
MONO#: 0.7 10*3/uL (ref 0.1–0.9)
MONO%: 10.6 % (ref 0.0–14.0)
NEUT%: 82.2 % — ABNORMAL HIGH (ref 38.4–76.8)
NEUTROS ABS: 5.3 10*3/uL (ref 1.5–6.5)
PLATELETS: 92 10*3/uL — AB (ref 145–400)
RBC: 3.24 10*6/uL — ABNORMAL LOW (ref 3.70–5.45)
RDW: 18.4 % — ABNORMAL HIGH (ref 11.2–14.5)
WBC: 6.5 10*3/uL (ref 3.9–10.3)
lymph#: 0.4 10*3/uL — ABNORMAL LOW (ref 0.9–3.3)

## 2016-04-04 NOTE — Progress Notes (Signed)
Location/Histology of Brain Tumor: Right occipital , vasogeni edema cerebral metastasis    New Consult  Patient presented with symptoms of:  Sever head ache ,nausea vomiting,visual changes  Past or anticipated interventions, if any, per neurosurgery:   Past or anticipated interventions, if any, per medical oncology: Dr. Mohamed,MD:chemotherapy  With carboplatin,almita on 03/28/16,   Dose of Decadron, if applicable: 8 mg oral bid 04/02/2016  Recent neurologic symptoms, if any:   Seizures: No  Headaches: yes,   Nausea:yes after chemotherapy , resolved at present  Dizziness/ataxia: yes  Difficulty with hand coordination: No  Focal numbness/weakness: NO  Visual deficits/changes:yes,  Blurred vision left eye, perception off  Confusion/Memory deficits: Yes, to both  Painful bone metastases at present, if any: NO  SAFETY ISSUES: yes  Prior radiation? YES, Right chest region,10/20/2012-2/28/214,50.4Gy/28 x Dr. Gery Pray, MD(Non-small cell lung cancer,)  Pacemaker/ICD? NO  Possible current pregnancy? No  Is the patient on methotrexate? NO  Additional Complaints / other details: depression / anxiety  Allergies: Contrast Media(Iodinated diagnostic agents) Rash, per patietmn Pet scan no allergy to IV contrast media 03/20/2016 Compazine=confusion, BP 114/78 mmHg  Pulse 93  Temp(Src) 98.8 F (37.1 C) (Oral)  Resp 16  Ht 5' 6.5" (1.689 m)  Wt 146 lb 6.4 oz (66.407 kg)  BMI 23.28 kg/m2  Wt Readings from Last 3 Encounters:  04/08/16 146 lb 6.4 oz (66.407 kg)  04/01/16 148 lb 4.8 oz (67.268 kg)  03/28/16 148 lb 4.8 oz (67.268 kg)

## 2016-04-05 ENCOUNTER — Ambulatory Visit
Admission: RE | Admit: 2016-04-05 | Discharge: 2016-04-05 | Disposition: A | Discharge status: Home / Self Care | Payer: PRIVATE HEALTH INSURANCE | Source: Ambulatory Visit | Attending: Radiation Oncology | Admitting: Radiation Oncology

## 2016-04-05 DIAGNOSIS — C7949 Secondary malignant neoplasm of other parts of nervous system: Principal | ICD-10-CM

## 2016-04-05 DIAGNOSIS — C7931 Secondary malignant neoplasm of brain: Secondary | ICD-10-CM

## 2016-04-05 MED ORDER — GADOBENATE DIMEGLUMINE 529 MG/ML IV SOLN
13.0000 mL | Freq: Once | INTRAVENOUS | Status: AC | PRN
  Administered 2016-04-05: 13 mL via INTRAVENOUS

## 2016-04-08 ENCOUNTER — Ambulatory Visit
Admission: RE | Admit: 2016-04-08 | Discharge: 2016-04-08 | Disposition: A | Discharge status: Home / Self Care | Payer: PRIVATE HEALTH INSURANCE | Source: Ambulatory Visit | Attending: Radiation Oncology | Admitting: Radiation Oncology

## 2016-04-08 ENCOUNTER — Encounter: Payer: Self-pay | Admitting: Radiation Oncology

## 2016-04-08 ENCOUNTER — Telehealth: Payer: Self-pay | Admitting: *Deleted

## 2016-04-08 VITALS — BP 114/78 | HR 93 | Temp 98.8°F | Resp 16 | Ht 66.5 in | Wt 146.4 lb

## 2016-04-08 DIAGNOSIS — Z510 Encounter for antineoplastic radiation therapy: Secondary | ICD-10-CM | POA: Diagnosis present

## 2016-04-08 DIAGNOSIS — C7931 Secondary malignant neoplasm of brain: Secondary | ICD-10-CM

## 2016-04-08 DIAGNOSIS — C342 Malignant neoplasm of middle lobe, bronchus or lung: Secondary | ICD-10-CM

## 2016-04-08 DIAGNOSIS — C7949 Secondary malignant neoplasm of other parts of nervous system: Principal | ICD-10-CM

## 2016-04-08 DIAGNOSIS — C3490 Malignant neoplasm of unspecified part of unspecified bronchus or lung: Secondary | ICD-10-CM | POA: Diagnosis not present

## 2016-04-08 NOTE — Addendum Note (Signed)
Encounter addended by: Kyung Rudd, MD on: 04/08/2016  4:12 PM<BR>     Documentation filed: Problem List

## 2016-04-08 NOTE — Progress Notes (Signed)
Please see the Nurse Progress Note in the MD Initial Consult Encounter for this patient. 
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
## 2016-04-08 NOTE — Telephone Encounter (Signed)
TC from patient inquiring about her appointments for this week and asking for a "letter" for vacation insurance purposes as she is having surgery at the end of the week.  Patient's appts this week are in Radiation Oncology. Reviewed appts with patient and advised her to discuss this issue with Dr. Lisbeth Renshaw and or his nurse.

## 2016-04-08 NOTE — Progress Notes (Signed)
Rec'd FMLA forms for Shaleta and Leia Coletti to be completed by Dr. Lisbeth Renshaw and his nurse, Thayer Headings.

## 2016-04-09 ENCOUNTER — Telehealth: Payer: Self-pay | Admitting: *Deleted

## 2016-04-09 ENCOUNTER — Ambulatory Visit
Admission: RE | Admit: 2016-04-09 | Discharge: 2016-04-09 | Disposition: A | Discharge status: Home / Self Care | Payer: PRIVATE HEALTH INSURANCE | Source: Ambulatory Visit | Attending: Radiation Oncology | Admitting: Radiation Oncology

## 2016-04-09 ENCOUNTER — Other Ambulatory Visit: Payer: Self-pay | Admitting: Radiation Therapy

## 2016-04-09 VITALS — BP 117/64 | HR 98 | Temp 97.7°F | Resp 16 | Wt 146.2 lb

## 2016-04-09 DIAGNOSIS — Z510 Encounter for antineoplastic radiation therapy: Secondary | ICD-10-CM | POA: Diagnosis not present

## 2016-04-09 DIAGNOSIS — C7931 Secondary malignant neoplasm of brain: Secondary | ICD-10-CM

## 2016-04-09 MED ORDER — SODIUM CHLORIDE 0.9% FLUSH
10.0000 mL | Freq: Once | INTRAVENOUS | Status: AC
  Administered 2016-04-09: 10 mL via INTRAVENOUS

## 2016-04-09 MED ORDER — HEPARIN SOD (PORK) LOCK FLUSH 100 UNIT/ML IV SOLN
500.0000 [IU] | Freq: Once | INTRAVENOUS | Status: AC
  Administered 2016-04-09: 500 [IU] via INTRAVENOUS

## 2016-04-09 NOTE — Progress Notes (Signed)
Patient has completed her Ct simulation of brain, de accessed her right power port using sterile technique, flushes first with 101m normal saline, excellent blood return, then flushed with  500units.ml of heparain flush per protocol, removed power  Port needle, intact, band aid applied over site, patient tolerated well, escorted patient to waiting room where husband was, d/c home,  3:02 PM

## 2016-04-09 NOTE — Progress Notes (Signed)
Patient gave name and dob as identification, she took '25mg'$  oral benadryl before leaving home today, vitals wnl ,  Took tylenol for mild head ache, offered water, patient stated she doesn't have allergy to iv dye that has Ct scans done, only with Pet scan, not premedicated for ct scans stated patient, labs dione on 04/04/16: BUN=10.8, CR:0.8, accessed right power  port a cath using sterile technique, flushed with 82m normal saline and blood returned seen, then flushed with the rest 748mnormal saline,patient olerated well no c/o pain, opsite placed over site, warm blanket offered, informed BrAaron EdelmanT Therap[place]  Patient is ready 1:43 PM

## 2016-04-09 NOTE — Telephone Encounter (Signed)
Tried calling wife, no answer, called spouse,Bobby, he will call wife and tell her to take '25mg'$  benadryl before coming for her CT simulation, allergy to IV dye, per Shona Simpson, PA 10:27 AM

## 2016-04-10 ENCOUNTER — Encounter (HOSPITAL_COMMUNITY)
Admission: RE | Admit: 2016-04-10 | Discharge: 2016-04-10 | Disposition: A | Discharge status: Home / Self Care | Payer: PRIVATE HEALTH INSURANCE | Source: Ambulatory Visit | Attending: Neurosurgery | Admitting: Neurosurgery

## 2016-04-10 ENCOUNTER — Other Ambulatory Visit: Payer: Self-pay

## 2016-04-10 ENCOUNTER — Other Ambulatory Visit: Payer: Self-pay | Admitting: Neurosurgery

## 2016-04-10 ENCOUNTER — Other Ambulatory Visit (HOSPITAL_COMMUNITY): Payer: Self-pay | Admitting: *Deleted

## 2016-04-10 ENCOUNTER — Encounter (HOSPITAL_COMMUNITY): Payer: Self-pay

## 2016-04-10 HISTORY — DX: Headache, unspecified: R51.9

## 2016-04-10 HISTORY — DX: Pneumonia, unspecified organism: J18.9

## 2016-04-10 HISTORY — DX: Headache: R51

## 2016-04-10 HISTORY — DX: Personal history of other medical treatment: Z92.89

## 2016-04-10 HISTORY — DX: Personal history of other diseases of the digestive system: Z87.19

## 2016-04-10 LAB — CBC
HCT: 34.2 % — ABNORMAL LOW (ref 36.0–46.0)
Hemoglobin: 11.1 g/dL — ABNORMAL LOW (ref 12.0–15.0)
MCH: 32.2 pg (ref 26.0–34.0)
MCHC: 32.5 g/dL (ref 30.0–36.0)
MCV: 99.1 fL (ref 78.0–100.0)
PLATELETS: 87 10*3/uL — AB (ref 150–400)
RBC: 3.45 MIL/uL — AB (ref 3.87–5.11)
RDW: 17.7 % — AB (ref 11.5–15.5)
WBC: 8.6 10*3/uL (ref 4.0–10.5)

## 2016-04-10 LAB — TYPE AND SCREEN
ABO/RH(D): O POS
Antibody Screen: NEGATIVE

## 2016-04-10 LAB — BASIC METABOLIC PANEL
Anion gap: 9 (ref 5–15)
BUN: 14 mg/dL (ref 6–20)
CALCIUM: 9.3 mg/dL (ref 8.9–10.3)
CHLORIDE: 96 mmol/L — AB (ref 101–111)
CO2: 29 mmol/L (ref 22–32)
CREATININE: 0.78 mg/dL (ref 0.44–1.00)
GFR calc non Af Amer: >60 mL/min (ref >60)
GLUCOSE: 106 mg/dL — AB (ref 65–99)
Potassium: 3.8 mmol/L (ref 3.5–5.1)
Sodium: 134 mmol/L — ABNORMAL LOW (ref 135–145)

## 2016-04-10 NOTE — Pre-Procedure Instructions (Signed)
Tracy Dodson  04/10/2016    Your procedure is scheduled on Friday, April 12, 2016 at 7:30 AM.   Report to [facility] Entrance "A" Admitting Office at 5:30 AM.   Call this number if you have problems the morning of surgery: [phone]   Any questions prior to day of surgery, please call [phone] between 8 & 4 PM.   Remember:  Do not eat food or drink liquids after midnight Thursday, 04/11/16.  Take these medicines the morning of surgery with A SIP OF WATER: Citalopram (Celexa), Pantoprazole (Protonix), Tylenol - if needed  Stop Multivitamins, Fish Oil and Herbal Medications as of today.   Do not wear jewelry, make-up or nail polish.  Do not wear lotions, powders, or perfumes.  You may wear deoderant.  Do not shave 48 hours prior to surgery.    Do not bring valuables to the hospital.  [facility] is not responsible for any belongings or valuables.  Contacts, dentures or bridgework may not be worn into surgery.  Leave your suitcase in the car.  After surgery it may be brought to your room.  For patients admitted to the hospital, discharge time will be determined by your treatment team.  Special instructions:  [place] - Preparing for Surgery  Before surgery, you can play an important role.  Because skin is not sterile, your skin needs to be as free of germs as possible.  You can reduce the number of germs on you skin by washing with CHG (chlorahexidine gluconate) soap before surgery.  CHG is an antiseptic cleaner which kills germs and bonds with the skin to continue killing germs even after washing.  Please DO NOT use if you have an allergy to CHG or antibacterial soaps.  If your skin becomes reddened/irritated stop using the CHG and inform your nurse when you arrive at Short Stay.  Do not shave (including legs and underarms) for at least 48 hours prior to the first CHG shower.  You may shave your face.  Please follow these instructions carefully:   1.  Shower  with CHG Soap the night before surgery and the                                morning of Surgery.  2.  If you choose to wash your hair, wash your hair first as usual with your       normal shampoo.  3.  After you shampoo, rinse your hair and body thoroughly to remove the                      Shampoo.  4.  Use CHG as you would any other liquid soap.  You can apply chg directly       to the skin and wash gently with scrungie or a clean washcloth.  5.  Apply the CHG Soap to your body ONLY FROM THE NECK DOWN.        Do not use on open wounds or open sores.  Avoid contact with your eyes, ears, mouth and genitals (private parts).  Wash genitals (private parts) with your normal soap.  6.  Wash thoroughly, paying special attention to the area where your surgery        will be performed.  7.  Thoroughly rinse your body with warm water from the neck down.  8.  DO NOT shower/wash with your  normal soap after using and rinsing off       the CHG Soap.  9.  Pat yourself dry with a clean towel.            10.  Wear clean pajamas.            11.  Place clean sheets on your bed the night of your first shower and do not        sleep with pets.  Day of Surgery  Do not apply any lotions the morning of surgery.  Please wear clean clothes to the hospital.   Please read over the following fact sheets that you were given. Pain Booklet, Coughing and Deep Breathing and Surgical Site Infection Prevention

## 2016-04-10 NOTE — Progress Notes (Signed)
Pt denies cardiac history, chest pain or sob. 

## 2016-04-11 ENCOUNTER — Encounter: Payer: Self-pay | Admitting: Radiation Oncology

## 2016-04-11 ENCOUNTER — Ambulatory Visit
Admission: RE | Admit: 2016-04-11 | Discharge: 2016-04-11 | Disposition: A | Discharge status: Home / Self Care | Payer: PRIVATE HEALTH INSURANCE | Source: Ambulatory Visit | Attending: Radiation Oncology | Admitting: Radiation Oncology

## 2016-04-11 ENCOUNTER — Other Ambulatory Visit (HOSPITAL_BASED_OUTPATIENT_CLINIC_OR_DEPARTMENT_OTHER): Payer: PRIVATE HEALTH INSURANCE

## 2016-04-11 ENCOUNTER — Ambulatory Visit: Payer: PRIVATE HEALTH INSURANCE | Admitting: Radiation Oncology

## 2016-04-11 VITALS — BP 140/73 | HR 73 | Temp 98.3°F | Resp 18

## 2016-04-11 DIAGNOSIS — C3490 Malignant neoplasm of unspecified part of unspecified bronchus or lung: Secondary | ICD-10-CM | POA: Diagnosis not present

## 2016-04-11 DIAGNOSIS — C342 Malignant neoplasm of middle lobe, bronchus or lung: Secondary | ICD-10-CM | POA: Diagnosis not present

## 2016-04-11 DIAGNOSIS — C7931 Secondary malignant neoplasm of brain: Secondary | ICD-10-CM | POA: Diagnosis present

## 2016-04-11 DIAGNOSIS — Z510 Encounter for antineoplastic radiation therapy: Secondary | ICD-10-CM | POA: Diagnosis not present

## 2016-04-11 LAB — CBC WITH DIFFERENTIAL/PLATELET
BASO%: 0.1 % (ref 0.0–2.0)
BASOS ABS: 0 10*3/uL (ref 0.0–0.1)
EOS%: 0.6 % (ref 0.0–7.0)
Eosinophils Absolute: 0.1 10*3/uL (ref 0.0–0.5)
HCT: 32.2 % — ABNORMAL LOW (ref 34.8–46.6)
HGB: 11.1 g/dL — ABNORMAL LOW (ref 11.6–15.9)
LYMPH%: 13.9 % — AB (ref 14.0–49.7)
MCH: 33.5 pg (ref 25.1–34.0)
MCHC: 34.5 g/dL (ref 31.5–36.0)
MCV: 97.3 fL (ref 79.5–101.0)
MONO#: 2 10*3/uL — ABNORMAL HIGH (ref 0.1–0.9)
MONO%: 19 % — AB (ref 0.0–14.0)
NEUT#: 7.1 10*3/uL — ABNORMAL HIGH (ref 1.5–6.5)
NEUT%: 66.4 % (ref 38.4–76.8)
Platelets: 77 10*3/uL — ABNORMAL LOW (ref 145–400)
RBC: 3.31 10*6/uL — AB (ref 3.70–5.45)
RDW: 17.8 % — AB (ref 11.2–14.5)
WBC: 10.7 10*3/uL — ABNORMAL HIGH (ref 3.9–10.3)
lymph#: 1.5 10*3/uL (ref 0.9–3.3)

## 2016-04-11 LAB — COMPREHENSIVE METABOLIC PANEL
ALK PHOS: 60 U/L (ref 40–150)
ALT: 20 U/L (ref 0–55)
AST: 19 U/L (ref 5–34)
Albumin: 4 g/dL (ref 3.5–5.0)
Anion Gap: 11 mEq/L (ref 3–11)
BILIRUBIN TOTAL: 0.71 mg/dL (ref 0.20–1.20)
BUN: 15.7 mg/dL (ref 7.0–26.0)
CHLORIDE: 97 meq/L — AB (ref 98–109)
CO2: 29 mEq/L (ref 22–29)
CREATININE: 0.8 mg/dL (ref 0.6–1.1)
Calcium: 9.3 mg/dL (ref 8.4–10.4)
EGFR: 75 mL/min/{1.73_m2} — ABNORMAL LOW (ref >90)
GLUCOSE: 94 mg/dL (ref 70–140)
POTASSIUM: 3.7 meq/L (ref 3.5–5.1)
SODIUM: 137 meq/L (ref 136–145)
Total Protein: 6.6 g/dL (ref 6.4–8.3)

## 2016-04-11 LAB — TECHNOLOGIST REVIEW

## 2016-04-11 MED ORDER — CEFAZOLIN SODIUM-DEXTROSE 2-4 GM/100ML-% IV SOLN
2.0000 g | INTRAVENOUS | Status: AC
  Administered 2016-04-12: 2 g via INTRAVENOUS
  Filled 2016-04-11 (×2): qty 100

## 2016-04-11 NOTE — Progress Notes (Signed)
Ms. Adalaide Jaskolski has picked up her FMLA documents.

## 2016-04-11 NOTE — Progress Notes (Signed)
  Radiation Oncology         [phone] ________________________________  Name: Tracy Dodson MRN: [id number]  Date: 04/11/2016  DOB: [birth year]  End of Treatment Note  Diagnosis:      ICD-9-CM ICD-10-CM   1. Brain metastasis ([place]) 198.3 C79.31         Indication for treatment:  palliative       Radiation treatment dates:   04/11/2016  Site/dose:    PTV1 Rt occipital target 25 mm target was treated using 4 Arcs to a prescription dose of 14 Gy. ExacTrac Snap verification was performed for each couch angle.   Narrative: The patient tolerated radiation treatment well.   There were no signs of acute toxicity after treatment.  Plan: The patient has completed radiation treatment. The patient will proceed with surgery tomorrow to resect the treated solitary metastasis. The patient will return to radiation oncology clinic for routine followup in one month. I advised the patient to call or return sooner if they have any questions or concerns related to their recovery or treatment. ________________________________  ------------------------------------------------  Jodelle Gross, MD, PhD

## 2016-04-11 NOTE — Progress Notes (Signed)
  Radiation Oncology         [phone]) [phone] ________________________________  Name: ADAM DEMARY MRN: [id number]  Date: 04/09/2016  DOB: 09/10/1951  DIAGNOSIS:     ICD-9-CM ICD-10-CM   1. Brain metastasis ([place]) 198.3 C79.31     NARRATIVE:  The patient was brought to the [facility].  Identity was confirmed.  All relevant records and images related to the planned course of therapy were reviewed.  The patient freely provided informed written consent to proceed with treatment after reviewing the details related to the planned course of therapy. The consent form was witnessed and verified by the simulation staff. Intravenous access was established for contrast administration. Then, the patient was set-up in a stable reproducible supine position for radiation therapy.  A relocatable thermoplastic stereotactic head frame was fabricated for precise immobilization.  CT images were obtained.  Surface markings were placed.  The CT images were loaded into the planning software and fused with the patient's targeting MRI scan.  Then the target and avoidance structures were contoured.  Treatment planning then occurred.  The radiation prescription was entered and confirmed.  I have requested 3D planning  I have requested a DVH of the following structures: Brain stem, brain, left eye, right eye, lenses, optic chiasm, target volumes, uninvolved brain, and normal tissue.    SPECIAL TREATMENT PROCEDURE:  The planned course of therapy using radiation constitutes a special treatment procedure. Special care is required in the management of this patient for the following reasons. This treatment constitutes a Special Treatment Procedure for the following reason: High dose per fraction requiring special monitoring for increased toxicities of treatment including daily imaging.  The special nature of the planned course of radiotherapy will require increased physician supervision and oversight to ensure patient's  safety with optimal treatment outcomes.  PLAN:  The patient will receive 14 Gy in 1 fraction.   ------------------------------------------------  Jodelle Gross, MD, PhD

## 2016-04-11 NOTE — Progress Notes (Signed)
  Radiation Oncology         [phone] ________________________________  Name: Tracy Dodson MRN: [id number]  Date: 04/11/2016  DOB: [birth year]   SPECIAL TREATMENT PROCEDURE   3D TREATMENT PLANNING AND DOSIMETRY: The patient's radiation plan was reviewed and approved by Dr. Elenor Legato from neurosurgery and radiation oncology prior to treatment. It showed 3-dimensional radiation distributions overlaid onto the planning CT/MRI image set. The [facility] for the target structures as well as the organs at risk were reviewed. The documentation of the 3D plan and dosimetry are filed in the radiation oncology EMR.   NARRATIVE: The patient was brought to the TrueBeam stereotactic radiation treatment machine and placed supine on the CT couch. The head frame was applied, and the patient was set up for stereotactic radiosurgery. Neurosurgery was present for the set-up and delivery   SIMULATION VERIFICATION: In the couch zero-angle position, the patient underwent Exactrac imaging using the Brainlab system with orthogonal KV images. These were carefully aligned and repeated to confirm treatment position for each of the isocenters. The Exactrac snap film verification was repeated at each couch angle.   SPECIAL TREATMENT PROCEDURE: The patient received stereotactic radiosurgery to the following target:  PTV1 Rt occipital target 25 mm target was treated using 4 Arcs to a prescription dose of 14 Gy. ExacTrac Snap verification was performed for each couch angle.   STEREOTACTIC TREATMENT MANAGEMENT: Following delivery, the patient was transported to nursing in stable condition and monitored for possible acute effects. Vital signs were recorded . The patient tolerated treatment without significant acute effects, and was discharged to home in stable condition.  PLAN: Follow-up in one month.   ------------------------------------------------  Jodelle Gross, MD, PhD

## 2016-04-11 NOTE — Progress Notes (Signed)
S/p SRS brain, patient tolerated dwell, vitals wnl, no nausea, or head ache, no dizzy ne stated, offered water, warm blanket offered,  Gave instructions to call  MD for any unusual symptoms not normal for her, fever>100.5, nausea,vomiting, vision changes, verbal understanding, monitor for 30 minutes then can be d/c home ambulatory, patient doesn't drive 8:58 PM

## 2016-04-11 NOTE — Progress Notes (Deleted)
Pt education done, my business card, a sitz bath, radiation therapy and you book given to the patient, discussed ways to manage side effects, fatigue, skin irritation, nausea, diarrhea, bladder  Changes,low fiber diet  For diarrhea and imodium  Prn, increase protein in diet,stay hydrated, drink plenty fluids, use sitz bath prn, but baby wipes without alcohol, may ned to eat smaller meals thoughout the day, brat diet, teach back given 4:13 PM

## 2016-04-12 ENCOUNTER — Inpatient Hospital Stay (HOSPITAL_COMMUNITY): Payer: PRIVATE HEALTH INSURANCE | Admitting: Anesthesiology

## 2016-04-12 ENCOUNTER — Inpatient Hospital Stay (HOSPITAL_COMMUNITY): Payer: PRIVATE HEALTH INSURANCE

## 2016-04-12 ENCOUNTER — Encounter (HOSPITAL_COMMUNITY)
Admission: RE | Disposition: A | Discharge status: Home / Self Care | Payer: Self-pay | Source: Ambulatory Visit | Attending: Neurosurgery

## 2016-04-12 ENCOUNTER — Ambulatory Visit: Payer: PRIVATE HEALTH INSURANCE | Admitting: Radiation Oncology

## 2016-04-12 ENCOUNTER — Inpatient Hospital Stay (HOSPITAL_COMMUNITY)
Admission: RE | Admit: 2016-04-12 | Discharge: 2016-04-14 | DRG: 027 | Disposition: A | Discharge status: Home / Self Care | Payer: PRIVATE HEALTH INSURANCE | Source: Ambulatory Visit | Attending: Neurosurgery | Admitting: Neurosurgery

## 2016-04-12 ENCOUNTER — Encounter (HOSPITAL_COMMUNITY): Payer: Self-pay | Admitting: Anesthesiology

## 2016-04-12 DIAGNOSIS — Z888 Allergy status to other drugs, medicaments and biological substances status: Secondary | ICD-10-CM | POA: Diagnosis not present

## 2016-04-12 DIAGNOSIS — M19011 Primary osteoarthritis, right shoulder: Secondary | ICD-10-CM | POA: Diagnosis present

## 2016-04-12 DIAGNOSIS — M1710 Unilateral primary osteoarthritis, unspecified knee: Secondary | ICD-10-CM | POA: Diagnosis present

## 2016-04-12 DIAGNOSIS — F419 Anxiety disorder, unspecified: Secondary | ICD-10-CM | POA: Diagnosis present

## 2016-04-12 DIAGNOSIS — C7931 Secondary malignant neoplasm of brain: Secondary | ICD-10-CM | POA: Diagnosis present

## 2016-04-12 DIAGNOSIS — I10 Essential (primary) hypertension: Secondary | ICD-10-CM | POA: Diagnosis present

## 2016-04-12 DIAGNOSIS — M353 Polymyalgia rheumatica: Secondary | ICD-10-CM | POA: Diagnosis present

## 2016-04-12 DIAGNOSIS — Z85118 Personal history of other malignant neoplasm of bronchus and lung: Secondary | ICD-10-CM | POA: Diagnosis not present

## 2016-04-12 DIAGNOSIS — Z87891 Personal history of nicotine dependence: Secondary | ICD-10-CM | POA: Diagnosis not present

## 2016-04-12 DIAGNOSIS — Z9889 Other specified postprocedural states: Secondary | ICD-10-CM

## 2016-04-12 DIAGNOSIS — Z91041 Radiographic dye allergy status: Secondary | ICD-10-CM | POA: Diagnosis not present

## 2016-04-12 DIAGNOSIS — F329 Major depressive disorder, single episode, unspecified: Secondary | ICD-10-CM | POA: Diagnosis present

## 2016-04-12 DIAGNOSIS — Z9221 Personal history of antineoplastic chemotherapy: Secondary | ICD-10-CM | POA: Diagnosis not present

## 2016-04-12 DIAGNOSIS — E785 Hyperlipidemia, unspecified: Secondary | ICD-10-CM | POA: Diagnosis present

## 2016-04-12 HISTORY — PX: APPLICATION OF CRANIAL NAVIGATION: SHX6578

## 2016-04-12 HISTORY — PX: CRANIOTOMY: SHX93

## 2016-04-12 SURGERY — CRANIOTOMY TUMOR EXCISION
Anesthesia: General

## 2016-04-12 MED ORDER — PHENYLEPHRINE 40 MCG/ML (10ML) SYRINGE FOR IV PUSH (FOR BLOOD PRESSURE SUPPORT)
PREFILLED_SYRINGE | INTRAVENOUS | Status: AC
  Filled 2016-04-12: qty 10

## 2016-04-12 MED ORDER — HYDROMORPHONE HCL 1 MG/ML IJ SOLN
INTRAMUSCULAR | Status: AC
  Filled 2016-04-12: qty 1

## 2016-04-12 MED ORDER — LOSARTAN POTASSIUM 50 MG PO TABS
100.0000 mg | ORAL_TABLET | Freq: Every day | ORAL | Status: DC
Start: 2016-04-12 — End: 2016-04-12

## 2016-04-12 MED ORDER — MELATONIN 5 MG PO TABS: 5.0000 mg | ORAL_TABLET | Freq: Every evening | ORAL | Status: DC | PRN

## 2016-04-12 MED ORDER — LOSARTAN POTASSIUM 50 MG PO TABS
100.0000 mg | ORAL_TABLET | Freq: Every day | ORAL | Status: DC
  Administered 2016-04-13 – 2016-04-14 (×2): 100 mg via ORAL
  Filled 2016-04-12 (×2): qty 2

## 2016-04-12 MED ORDER — OMEGA-3-ACID ETHYL ESTERS 1 G PO CAPS
1.0000 g | ORAL_CAPSULE | Freq: Every day | ORAL | Status: DC
Start: 2016-04-12 — End: 2016-04-14
  Administered 2016-04-12 – 2016-04-14 (×2): 1 g via ORAL
  Filled 2016-04-12 (×3): qty 1

## 2016-04-12 MED ORDER — DEXAMETHASONE 4 MG PO TABS
4.0000 mg | ORAL_TABLET | Freq: Four times a day (QID) | ORAL | Status: DC
  Administered 2016-04-12 – 2016-04-14 (×9): 4 mg via ORAL
  Filled 2016-04-12 (×10): qty 1

## 2016-04-12 MED ORDER — HYDROCODONE-ACETAMINOPHEN 5-325 MG PO TABS
1.0000 | ORAL_TABLET | ORAL | Status: DC | PRN
  Filled 2016-04-12: qty 1

## 2016-04-12 MED ORDER — MANNITOL 25 % IV SOLN
INTRAVENOUS | Status: DC | PRN
  Administered 2016-04-12: 25 g via INTRAVENOUS

## 2016-04-12 MED ORDER — HYDROCHLOROTHIAZIDE 25 MG PO TABS
25.0000 mg | ORAL_TABLET | Freq: Every day | ORAL | Status: DC
Start: 2016-04-12 — End: 2016-04-12

## 2016-04-12 MED ORDER — CEFAZOLIN SODIUM-DEXTROSE 2-4 GM/100ML-% IV SOLN
2.0000 g | Freq: Three times a day (TID) | INTRAVENOUS | Status: AC
  Administered 2016-04-12 (×2): 2 g via INTRAVENOUS
  Filled 2016-04-12 (×2): qty 100

## 2016-04-12 MED ORDER — ONDANSETRON HCL 4 MG/2ML IJ SOLN
INTRAMUSCULAR | Status: AC
  Filled 2016-04-12: qty 2

## 2016-04-12 MED ORDER — GADOBENATE DIMEGLUMINE 529 MG/ML IV SOLN
15.0000 mL | Freq: Once | INTRAVENOUS | Status: AC
  Administered 2016-04-12: 13 mL via INTRAVENOUS

## 2016-04-12 MED ORDER — ONDANSETRON HCL 4 MG/2ML IJ SOLN
INTRAMUSCULAR | Status: DC | PRN
  Administered 2016-04-12: 4 mg via INTRAVENOUS

## 2016-04-12 MED ORDER — MICROFIBRILLAR COLL HEMOSTAT EX PADS
MEDICATED_PAD | CUTANEOUS | Status: DC | PRN
  Administered 2016-04-12: 1 via TOPICAL

## 2016-04-12 MED ORDER — SUGAMMADEX SODIUM 200 MG/2ML IV SOLN
INTRAVENOUS | Status: DC | PRN
  Administered 2016-04-12: 150 mg via INTRAVENOUS

## 2016-04-12 MED ORDER — LIDOCAINE 2% (20 MG/ML) 5 ML SYRINGE
INTRAMUSCULAR | Status: AC
  Filled 2016-04-12: qty 5

## 2016-04-12 MED ORDER — BISACODYL 10 MG RE SUPP
10.0000 mg | Freq: Every day | RECTAL | Status: DC | PRN
Start: 2016-04-12 — End: 2016-04-14

## 2016-04-12 MED ORDER — THROMBIN 5000 UNITS EX SOLR
CUTANEOUS | Status: DC | PRN
  Administered 2016-04-12 (×2): 5000 [IU] via TOPICAL

## 2016-04-12 MED ORDER — BACITRACIN 50000 UNITS IM SOLR
INTRAMUSCULAR | Status: DC | PRN
  Administered 2016-04-12: 500 mL

## 2016-04-12 MED ORDER — B COMPLEX-C PO TABS
1.0000 | ORAL_TABLET | Freq: Every day | ORAL | Status: DC
  Administered 2016-04-12 – 2016-04-14 (×2): 1 via ORAL
  Filled 2016-04-12 (×3): qty 1

## 2016-04-12 MED ORDER — CALCIUM CARBONATE-VITAMIN D 500-200 MG-UNIT PO TABS
1.0000 | ORAL_TABLET | Freq: Every day | ORAL | Status: DC
  Administered 2016-04-12 – 2016-04-14 (×2): 1 via ORAL
  Filled 2016-04-12 (×3): qty 1

## 2016-04-12 MED ORDER — ONDANSETRON HCL 4 MG/2ML IJ SOLN: 4.0000 mg | INTRAMUSCULAR | Status: DC | PRN

## 2016-04-12 MED ORDER — VITAMIN D 1000 UNITS PO TABS
4000.0000 [IU] | ORAL_TABLET | Freq: Every day | ORAL | Status: DC
  Administered 2016-04-12 – 2016-04-14 (×3): 4000 [IU] via ORAL
  Filled 2016-04-12 (×3): qty 4

## 2016-04-12 MED ORDER — SENNA 8.6 MG PO TABS
1.0000 | ORAL_TABLET | Freq: Two times a day (BID) | ORAL | Status: DC
  Administered 2016-04-12 – 2016-04-13 (×3): 8.6 mg via ORAL
  Filled 2016-04-12 (×4): qty 1

## 2016-04-12 MED ORDER — SODIUM CHLORIDE 0.9 % IV SOLN
INTRAVENOUS | Status: DC
  Administered 2016-04-12: 17:00:00 via INTRAVENOUS

## 2016-04-12 MED ORDER — HYDROMORPHONE HCL 1 MG/ML IJ SOLN
0.5000 mg | INTRAMUSCULAR | Status: DC | PRN
  Administered 2016-04-12 (×2): 0.5 mg via INTRAVENOUS

## 2016-04-12 MED ORDER — CITALOPRAM HYDROBROMIDE 20 MG PO TABS
20.0000 mg | ORAL_TABLET | Freq: Every day | ORAL | Status: DC
Start: 2016-04-13 — End: 2016-04-14
  Administered 2016-04-13 – 2016-04-14 (×2): 20 mg via ORAL
  Filled 2016-04-12: qty 1
  Filled 2016-04-12: qty 2

## 2016-04-12 MED ORDER — PROPOFOL 10 MG/ML IV BOLUS
INTRAVENOUS | Status: DC | PRN
  Administered 2016-04-12: 130 mg via INTRAVENOUS
  Administered 2016-04-12: 20 mg via INTRAVENOUS

## 2016-04-12 MED ORDER — LIDOCAINE-PRILOCAINE 2.5-2.5 % EX CREA
1.0000 "application " | TOPICAL_CREAM | CUTANEOUS | Status: DC | PRN
  Filled 2016-04-12: qty 5

## 2016-04-12 MED ORDER — CHLORHEXIDINE GLUCONATE CLOTH 2 % EX PADS: 6.0000 | MEDICATED_PAD | Freq: Once | CUTANEOUS | Status: DC

## 2016-04-12 MED ORDER — 0.9 % SODIUM CHLORIDE (POUR BTL) OPTIME
TOPICAL | Status: DC | PRN
  Administered 2016-04-12: 3000 mL

## 2016-04-12 MED ORDER — BUPIVACAINE HCL (PF) 0.5 % IJ SOLN
INTRAMUSCULAR | Status: DC | PRN
  Administered 2016-04-12: 10 mL

## 2016-04-12 MED ORDER — LOSARTAN POTASSIUM-HCTZ 100-25 MG PO TABS: 1.0000 | ORAL_TABLET | Freq: Every day | ORAL | Status: DC

## 2016-04-12 MED ORDER — PANTOPRAZOLE SODIUM 40 MG PO TBEC
40.0000 mg | DELAYED_RELEASE_TABLET | Freq: Every day | ORAL | Status: DC
  Administered 2016-04-12 – 2016-04-14 (×3): 40 mg via ORAL
  Filled 2016-04-12 (×3): qty 1

## 2016-04-12 MED ORDER — SUCCINYLCHOLINE CHLORIDE 200 MG/10ML IV SOSY
PREFILLED_SYRINGE | INTRAVENOUS | Status: AC
  Filled 2016-04-12: qty 10

## 2016-04-12 MED ORDER — B COMPLEX PO TABS: 1.0000 | ORAL_TABLET | Freq: Every day | ORAL | Status: DC

## 2016-04-12 MED ORDER — DOCUSATE SODIUM 100 MG PO CAPS
100.0000 mg | ORAL_CAPSULE | Freq: Two times a day (BID) | ORAL | Status: DC
  Administered 2016-04-12 – 2016-04-14 (×4): 100 mg via ORAL
  Filled 2016-04-12 (×4): qty 1

## 2016-04-12 MED ORDER — MORPHINE SULFATE (PF) 2 MG/ML IV SOLN
1.0000 mg | INTRAVENOUS | Status: DC | PRN
  Administered 2016-04-12: 2 mg via INTRAVENOUS
  Administered 2016-04-13: 1 mg via INTRAVENOUS
  Filled 2016-04-12 (×3): qty 1

## 2016-04-12 MED ORDER — BACITRACIN ZINC 500 UNIT/GM EX OINT
TOPICAL_OINTMENT | CUTANEOUS | Status: DC | PRN
  Administered 2016-04-12: 1 via TOPICAL

## 2016-04-12 MED ORDER — ROCURONIUM BROMIDE 50 MG/5ML IV SOLN
INTRAVENOUS | Status: AC
  Filled 2016-04-12: qty 1

## 2016-04-12 MED ORDER — VECURONIUM BROMIDE 10 MG IV SOLR
INTRAVENOUS | Status: DC | PRN
  Administered 2016-04-12 (×3): 2 mg via INTRAVENOUS

## 2016-04-12 MED ORDER — ROCURONIUM BROMIDE 100 MG/10ML IV SOLN
INTRAVENOUS | Status: DC | PRN
  Administered 2016-04-12: 30 mg via INTRAVENOUS
  Administered 2016-04-12: 20 mg via INTRAVENOUS

## 2016-04-12 MED ORDER — BIOTIN 5 MG PO CAPS: 10.0000 mg | ORAL_CAPSULE | Freq: Every day | ORAL | Status: DC

## 2016-04-12 MED ORDER — SUGAMMADEX SODIUM 200 MG/2ML IV SOLN
INTRAVENOUS | Status: AC
  Filled 2016-04-12: qty 2

## 2016-04-12 MED ORDER — HYDROCHLOROTHIAZIDE 25 MG PO TABS
25.0000 mg | ORAL_TABLET | Freq: Every day | ORAL | Status: DC
  Administered 2016-04-13 – 2016-04-14 (×2): 25 mg via ORAL
  Filled 2016-04-12 (×2): qty 1

## 2016-04-12 MED ORDER — MILK THISTLE 1000 MG PO CAPS: 1.0000 | ORAL_CAPSULE | Freq: Every day | ORAL | Status: DC

## 2016-04-12 MED ORDER — ONDANSETRON HCL 4 MG PO TABS: 8.0000 mg | ORAL_TABLET | Freq: Three times a day (TID) | ORAL | Status: DC | PRN

## 2016-04-12 MED ORDER — ONDANSETRON HCL 4 MG PO TABS: 4.0000 mg | ORAL_TABLET | ORAL | Status: DC | PRN

## 2016-04-12 MED ORDER — ONDANSETRON HCL 4 MG/2ML IJ SOLN: 4.0000 mg | Freq: Once | INTRAMUSCULAR | Status: DC | PRN

## 2016-04-12 MED ORDER — PROPOFOL 10 MG/ML IV BOLUS
INTRAVENOUS | Status: AC
  Filled 2016-04-12: qty 20

## 2016-04-12 MED ORDER — SODIUM CHLORIDE 0.9 % IV SOLN
INTRAVENOUS | Status: DC | PRN
  Administered 2016-04-12 (×3): via INTRAVENOUS

## 2016-04-12 MED ORDER — EPHEDRINE 5 MG/ML INJ
INTRAVENOUS | Status: AC
  Filled 2016-04-12: qty 10

## 2016-04-12 MED ORDER — HEMOSTATIC AGENTS (NO CHARGE) OPTIME
TOPICAL | Status: DC | PRN
  Administered 2016-04-12: 1 via TOPICAL

## 2016-04-12 MED ORDER — EPHEDRINE SULFATE 50 MG/ML IJ SOLN
INTRAMUSCULAR | Status: DC | PRN
  Administered 2016-04-12: 10 mg via INTRAVENOUS

## 2016-04-12 MED ORDER — SODIUM CHLORIDE 0.9 % IV SOLN
500.0000 mg | Freq: Two times a day (BID) | INTRAVENOUS | Status: DC
  Administered 2016-04-12 – 2016-04-13 (×3): 500 mg via INTRAVENOUS
  Filled 2016-04-12 (×7): qty 5

## 2016-04-12 MED ORDER — THROMBIN 20000 UNITS EX KIT
PACK | CUTANEOUS | Status: DC | PRN
  Administered 2016-04-12: 20000 [IU] via TOPICAL

## 2016-04-12 MED ORDER — CEFAZOLIN SODIUM 1 G IJ SOLR
INTRAMUSCULAR | Status: AC
  Filled 2016-04-12: qty 20

## 2016-04-12 MED ORDER — FENTANYL CITRATE (PF) 250 MCG/5ML IJ SOLN
INTRAMUSCULAR | Status: DC | PRN
  Administered 2016-04-12 (×3): 50 ug via INTRAVENOUS
  Administered 2016-04-12: 100 ug via INTRAVENOUS

## 2016-04-12 MED ORDER — LABETALOL HCL 5 MG/ML IV SOLN: 10.0000 mg | INTRAVENOUS | Status: DC | PRN

## 2016-04-12 MED ORDER — FOLIC ACID 1 MG PO TABS
1.0000 mg | ORAL_TABLET | Freq: Every day | ORAL | Status: DC
  Administered 2016-04-13 – 2016-04-14 (×2): 1 mg via ORAL
  Filled 2016-04-12 (×2): qty 1

## 2016-04-12 MED ORDER — FENTANYL CITRATE (PF) 250 MCG/5ML IJ SOLN
INTRAMUSCULAR | Status: AC
  Filled 2016-04-12: qty 5

## 2016-04-12 MED ORDER — ADULT MULTIVITAMIN W/MINERALS CH
ORAL_TABLET | Freq: Every day | ORAL | Status: DC
  Administered 2016-04-12 – 2016-04-14 (×3): 1 via ORAL
  Filled 2016-04-12 (×3): qty 1

## 2016-04-12 MED ORDER — LIDOCAINE-EPINEPHRINE 1 %-1:100000 IJ SOLN
INTRAMUSCULAR | Status: DC | PRN
  Administered 2016-04-12: 10 mL

## 2016-04-12 MED ORDER — VECURONIUM BROMIDE 10 MG IV SOLR
INTRAVENOUS | Status: AC
  Filled 2016-04-12: qty 10

## 2016-04-12 MED ORDER — PHENYLEPHRINE HCL 10 MG/ML IJ SOLN
INTRAMUSCULAR | Status: DC | PRN
  Administered 2016-04-12: 80 ug via INTRAVENOUS
  Administered 2016-04-12 (×3): 40 ug via INTRAVENOUS

## 2016-04-12 MED ORDER — LIDOCAINE HCL (CARDIAC) 20 MG/ML IV SOLN
INTRAVENOUS | Status: DC | PRN
  Administered 2016-04-12: 60 mg via INTRAVENOUS
  Administered 2016-04-12: 60 mg via INTRATRACHEAL

## 2016-04-12 MED ORDER — LEVETIRACETAM 500 MG/5ML IV SOLN
1000.0000 mg | INTRAVENOUS | Status: AC
  Administered 2016-04-12: 1000 mg via INTRAVENOUS
  Filled 2016-04-12: qty 10

## 2016-04-12 MED ORDER — NITROGLYCERIN 0.2 MG/ML ON CALL CATH LAB
INTRAVENOUS | Status: DC | PRN
  Administered 2016-04-12: 40 ug via INTRAVENOUS
  Administered 2016-04-12: 20 ug via INTRAVENOUS

## 2016-04-12 SURGICAL SUPPLY — 112 items
APL SKNCLS STERI-STRIP NONHPOA (GAUZE/BANDAGES/DRESSINGS)
BANDAGE GAUZE 4  KLING STR (GAUZE/BANDAGES/DRESSINGS) ×2 IMPLANT
BATTERY IQ STERILE (MISCELLANEOUS) ×2 IMPLANT
BENZOIN TINCTURE PRP APPL 2/3 (GAUZE/BANDAGES/DRESSINGS) IMPLANT
BLADE CLIPPER SURG (BLADE) ×3 IMPLANT
BLADE SAW GIGLI 16 STRL (MISCELLANEOUS) IMPLANT
BLADE SURG 15 STRL LF DISP TIS (BLADE) IMPLANT
BLADE SURG 15 STRL SS (BLADE)
BLADE ULTRA TIP 2M (BLADE) ×3 IMPLANT
BNDG GAUZE ELAST 4 BULKY (GAUZE/BANDAGES/DRESSINGS) ×2 IMPLANT
BRUSH SCRUB EZ 1% IODOPHOR (MISCELLANEOUS) ×1 IMPLANT
BTRY SRG DRVR 1.5 IQ (MISCELLANEOUS) ×1
BUR ACORN 6.0 PRECISION (BURR) ×2 IMPLANT
BUR ACORN 6.0MM PRECISION (BURR) ×1
BUR ADDG 1.1 (BURR) IMPLANT
BUR ADDG 1.1MM (BURR)
BUR ROUND FLUTED 4 SOFT TCH (BURR) IMPLANT
BUR ROUND FLUTED 4MM SOFT TCH (BURR)
BUR SPIRAL ROUTER 2.3 (BUR) ×2 IMPLANT
BUR SPIRAL ROUTER 2.3MM (BUR) ×1
CANISTER SUCT 3000ML PPV (MISCELLANEOUS) ×6 IMPLANT
CATH VENTRIC 35X38 W/TROCAR LG (CATHETERS) IMPLANT
CLIP TI MEDIUM 6 (CLIP) IMPLANT
CONT SPEC 4OZ CLIKSEAL STRL BL (MISCELLANEOUS) ×5 IMPLANT
COVER MAYO STAND STRL (DRAPES) IMPLANT
DECANTER SPIKE VIAL GLASS SM (MISCELLANEOUS) ×3 IMPLANT
DRAIN SNY WOU 7FLT (WOUND CARE) IMPLANT
DRAIN SUBARACHNOID (WOUND CARE) IMPLANT
DRAPE MICROSCOPE LEICA (MISCELLANEOUS) ×4 IMPLANT
DRAPE NEUROLOGICAL W/INCISE (DRAPES) ×3 IMPLANT
DRAPE PROXIMA HALF (DRAPES) ×3 IMPLANT
DRAPE STERI IOBAN 125X83 (DRAPES) IMPLANT
DRAPE SURG 17X23 STRL (DRAPES) IMPLANT
DRAPE WARM FLUID 44X44 (DRAPE) ×3 IMPLANT
DRSG ADAPTIC 3X8 NADH LF (GAUZE/BANDAGES/DRESSINGS) IMPLANT
DRSG TELFA 3X8 NADH (GAUZE/BANDAGES/DRESSINGS) ×3 IMPLANT
DURAMATRIX ONLAY 2X2 (Neuro Prosthesis/Implant) ×2 IMPLANT
DURAPREP 6ML APPLICATOR 50/CS (WOUND CARE) ×3 IMPLANT
ELECT REM PT RETURN 9FT ADLT (ELECTROSURGICAL) ×3
ELECTRODE REM PT RTRN 9FT ADLT (ELECTROSURGICAL) ×1 IMPLANT
EVACUATOR 1/8 PVC DRAIN (DRAIN) IMPLANT
EVACUATOR SILICONE 100CC (DRAIN) IMPLANT
FORCEPS BIPOLAR SPETZLER 8 1.0 (NEUROSURGERY SUPPLIES) ×3 IMPLANT
GAUZE SPONGE 4X4 12PLY STRL (GAUZE/BANDAGES/DRESSINGS) ×3 IMPLANT
GAUZE SPONGE 4X4 16PLY XRAY LF (GAUZE/BANDAGES/DRESSINGS) IMPLANT
GLOVE BIOGEL PI IND STRL 7.5 (GLOVE) ×1 IMPLANT
GLOVE BIOGEL PI INDICATOR 7.5 (GLOVE) ×4
GLOVE ECLIPSE 6.5 STRL STRAW (GLOVE) ×5 IMPLANT
GLOVE ECLIPSE 7.0 STRL STRAW (GLOVE) IMPLANT
GLOVE EXAM NITRILE LRG STRL (GLOVE) IMPLANT
GLOVE EXAM NITRILE MD LF STRL (GLOVE) IMPLANT
GLOVE EXAM NITRILE XL STR (GLOVE) IMPLANT
GLOVE EXAM NITRILE XS STR PU (GLOVE) IMPLANT
GLOVE SS BIOGEL STRL SZ 7 (GLOVE) IMPLANT
GLOVE SUPERSENSE BIOGEL SZ 7 (GLOVE) ×2
GOWN STRL REUS W/ TWL LRG LVL3 (GOWN DISPOSABLE) ×2 IMPLANT
GOWN STRL REUS W/ TWL XL LVL3 (GOWN DISPOSABLE) IMPLANT
GOWN STRL REUS W/TWL 2XL LVL3 (GOWN DISPOSABLE) IMPLANT
GOWN STRL REUS W/TWL LRG LVL3 (GOWN DISPOSABLE) ×6
GOWN STRL REUS W/TWL XL LVL3 (GOWN DISPOSABLE)
HEMOSTAT POWDER KIT SURGIFOAM (HEMOSTASIS) ×3 IMPLANT
HEMOSTAT POWDER SURGIFOAM 1G (HEMOSTASIS) ×4 IMPLANT
HEMOSTAT SURGICEL 2X14 (HEMOSTASIS) IMPLANT
HOOK DURA 1/2IN (MISCELLANEOUS) ×2 IMPLANT
IV NS 1000ML (IV SOLUTION) ×3
IV NS 1000ML BAXH (IV SOLUTION) ×1 IMPLANT
KIT BASIN OR (CUSTOM PROCEDURE TRAY) ×3 IMPLANT
KIT DRAIN CSF ACCUDRAIN (MISCELLANEOUS) IMPLANT
KIT ROOM TURNOVER OR (KITS) ×3 IMPLANT
KNIFE ARACHNOID DISP AM-24-S (MISCELLANEOUS) ×3 IMPLANT
MARKER SPHERE PSV REFLC 13MM (MARKER) ×6 IMPLANT
NDL HYPO 25X1 1.5 SAFETY (NEEDLE) ×1 IMPLANT
NDL SPNL 18GX3.5 QUINCKE PK (NEEDLE) IMPLANT
NEEDLE HYPO 25X1 1.5 SAFETY (NEEDLE) ×3 IMPLANT
NEEDLE SPNL 18GX3.5 QUINCKE PK (NEEDLE) IMPLANT
NS IRRIG 1000ML POUR BTL (IV SOLUTION) ×7 IMPLANT
PACK CRANIOTOMY (CUSTOM PROCEDURE TRAY) ×3 IMPLANT
PAD DRESSING TELFA 3X8 NADH (GAUZE/BANDAGES/DRESSINGS) IMPLANT
PAD EYE OVAL STERILE LF (GAUZE/BANDAGES/DRESSINGS) IMPLANT
PATTIES SURGICAL .25X.25 (GAUZE/BANDAGES/DRESSINGS) IMPLANT
PATTIES SURGICAL .5 X.5 (GAUZE/BANDAGES/DRESSINGS) IMPLANT
PATTIES SURGICAL .5 X3 (DISPOSABLE) ×2 IMPLANT
PATTIES SURGICAL 1/4 X 3 (GAUZE/BANDAGES/DRESSINGS) IMPLANT
PATTIES SURGICAL 1X1 (DISPOSABLE) IMPLANT
PIN MAYFIELD SKULL DISP (PIN) ×3 IMPLANT
PLATE 1.5/0.5 13MM BURR HOLE (Plate) ×8 IMPLANT
RUBBERBAND STERILE (MISCELLANEOUS) IMPLANT
SCREW SELF DRILL HT 1.5/4MM (Screw) ×24 IMPLANT
SET TUBING W/EXT DISP (INSTRUMENTS) ×5 IMPLANT
SPECIMEN JAR SMALL (MISCELLANEOUS) ×2 IMPLANT
SPONGE NEURO XRAY DETECT 1X3 (DISPOSABLE) IMPLANT
SPONGE SURGIFOAM ABS GEL 100 (HEMOSTASIS) ×3 IMPLANT
SPONGE SURGIFOAM ABS GEL SZ50 (HEMOSTASIS) ×2 IMPLANT
STAPLER VISISTAT 35W (STAPLE) ×3 IMPLANT
STOCKINETTE 6  STRL (DRAPES)
STOCKINETTE 6 STRL (DRAPES) IMPLANT
SUT ETHILON 3 0 FSL (SUTURE) IMPLANT
SUT ETHILON 3 0 PS 1 (SUTURE) IMPLANT
SUT NURALON 4 0 TR CR/8 (SUTURE) ×9 IMPLANT
SUT SILK 0 TIES 10X30 (SUTURE) IMPLANT
SUT VIC AB 0 CT1 18XCR BRD8 (SUTURE) ×2 IMPLANT
SUT VIC AB 0 CT1 8-18 (SUTURE) ×6
SUT VIC AB 3-0 SH 8-18 (SUTURE) ×8 IMPLANT
TIP SONASTAR STD MISONIX 1.9 (TRAY / TRAY PROCEDURE) IMPLANT
TIP STRAIGHT 25KHZ (INSTRUMENTS) ×5 IMPLANT
TOWEL OR 17X24 6PK STRL BLUE (TOWEL DISPOSABLE) ×3 IMPLANT
TOWEL OR 17X26 10 PK STRL BLUE (TOWEL DISPOSABLE) ×3 IMPLANT
TRAY FOLEY W/METER SILVER 16FR (SET/KITS/TRAYS/PACK) ×3 IMPLANT
TUBE CONNECTING 12'X1/4 (SUCTIONS) ×1
TUBE CONNECTING 12X1/4 (SUCTIONS) ×2 IMPLANT
UNDERPAD 30X30 INCONTINENT (UNDERPADS AND DIAPERS) ×3 IMPLANT
WATER STERILE IRR 1000ML POUR (IV SOLUTION) ×3 IMPLANT

## 2016-04-12 NOTE — Op Note (Signed)
PREOP DIAGNOSIS:  1. Metastatic brain tumor   POSTOP DIAGNOSIS: Same  PROCEDURE: 1. Right occipital craniotomy, resection of intra-axial tumor 2. Use of intraoperative stereotaxy 3. Use of intraoperative microscope for microdissection  SURGEON: Dr. Consuella Lose, MD  ASSISTANT: Dr. Cyndy Freeze, MD  ANESTHESIA: General Endotracheal  EBL: 200cc  SPECIMENS: Occipital tumor for permanent pathology  DRAINS: None  COMPLICATIONS: None immediate  CONDITION: Hemodynamically stable to PACU  HISTORY: Tracy Dodson is a 65 y.o. female with a history of metastatic non-small cell lung cancer. She was recently diagnosed with a right occipital tumor. Her case was discussed at the multidisciplinary neuro oncology conference, and was amenable to preoperative stereotactic radiosurgery followed by surgical resection. Risks and benefits of the planned treatment were discussed in detail with the patient and her husband. After all questions were answered, informed consent was obtained and witnessed. The patient underwent stereotactic radiosurgery yesterday.  PROCEDURE IN DETAIL: After informed consent was obtained and witnessed, the patient was brought to the operating room. After induction of general anesthesia, the patient was positioned on the operative table in the prone position in the Mayfield head holder. All pressure points were meticulously padded. Utilizing the preoperative stereotactic MRI scan, surface markers were co-registered until satisfactory accuracy was achieved. The surface projection of the superior sagittal sinus, torcular, and transverse sinus on the right side were marked out. A sigmoid shaped skin incision was then marked out to the right of midline. The area was then clipped prepped and draped in the usual sterile fashion.  After timeout was conducted, skin incision was infiltrated with local anesthetic with epinephrine. Incision was then made sharply and Bovie  electrocautery was used to dissect through subcutaneous days tissue. The galea was then incised, and subperiosteal dissection was carried out with the Bovie. Self retaining retractors were then placed. Stereotactic system was again used to identify the underlying venous sinuses. A craniotomy was then marked out to expose the right occipital pole. Craniotomy was then fashioned with multiple bur holes connected with the craniotome. Hemostasis was then achieved on the epidural surface with morcellized Gelfoam with thrombin and bipolar electrocautery. The dura was then opened in cruciate fashion and leaflets were tacked up with Nurolon.  At this point, the microscope was draped sterilely and brought into the field, and the remainder of the case was done under the microscope using microdissection. There stereotactic system was used to identify the corticectomy and the trajectory which would allow access to the tumor via a transcortical route. The pia was then coagulated and cut, and ultrasonic aspirator and bipolar electrocautery was used to dissect through white matter until the tumor was identified. It was slightly yellowish in color, and much more firm than the surrounding white matter. Using a combination of ultrasonic aspirator and bipolar electrocautery, the tumor was then circumferentially dissected from the surrounding white matter. It did appear to have a significant amount of adherence to the falx which was coagulated and divided. The tumor was then removed in single piece and sent for permanent pathology.   At this point, the wound was irrigated with copious amounts of normal saline irrigation. Hemostasis was achieved with morcellized Gelfoam with thrombin as well as bipolar electrocautery. The dural leaflets were then reapproximated with 4-0 Nurolon stitches. A dural onlay graft was placed. The bone flap was then replaced with standard titanium plates and screws. The galea was closed with interrupted 3-0  Vicryl stitches, and the skin closed with skin staples. The patient was then  transferred to the stretcher, the Mayfield removed, and sterile dressings applied.   At the end of the case all sponge, needle, instrument, and cottonoid counts were correct. The patient was then dated and taken to the postanesthesia care unit in stable hemodynamic condition.

## 2016-04-12 NOTE — Transfer of Care (Signed)
Immediate Anesthesia Transfer of Care Note  Patient: Tracy Dodson  Procedure(s) Performed: Procedure(s) with comments: CRANIOTOMY TUMOR EXCISION WITH BRAINLAB (N/A) - CRANIOTOMY TUMOR EXCISION WITH BRAINLAB APPLICATION OF CRANIAL NAVIGATION (N/A)  Patient Location: PACU  Anesthesia Type:General  Level of Consciousness: awake, alert , oriented and patient cooperative  Airway & Oxygen Therapy: Patient Spontanous Breathing and Patient connected to nasal cannula oxygen  Post-op Assessment: Report given to RN, Post -op Vital signs reviewed and stable and Patient moving all extremities  Post vital signs: Reviewed and stable  Last Vitals:  Filed Vitals:   04/12/16 0625  BP: 132/87  Pulse: 69  Temp: 36.8 C  Resp: 18    Last Pain:  Filed Vitals:   04/12/16 0637  PainSc: 0-No pain         Complications: No apparent anesthesia complications

## 2016-04-12 NOTE — Anesthesia Preprocedure Evaluation (Signed)
Anesthesia Evaluation  Patient identified by MRN, date of birth, ID band  Reviewed: Allergy & Precautions, NPO status , Patient's Chart, lab work & pertinent test results  Airway Mallampati: I  TM Distance: >3 FB     Dental   Pulmonary former smoker,    Pulmonary exam normal        Cardiovascular hypertension, Normal cardiovascular exam     Neuro/Psych  Headaches, Anxiety Depression  Neuromuscular disease    GI/Hepatic hiatal hernia,   Endo/Other    Renal/GU      Musculoskeletal  (+) Arthritis ,   Abdominal   Peds  Hematology   Anesthesia Other Findings   Reproductive/Obstetrics                             Anesthesia Physical Anesthesia Plan  ASA: III  Anesthesia Plan: General   Post-op Pain Management:    Induction: Intravenous  Airway Management Planned: Oral ETT  Additional Equipment: Arterial line  Intra-op Plan:   Post-operative Plan: Extubation in OR  Informed Consent: I have reviewed the patients History and Physical, chart, labs and discussed the procedure including the risks, benefits and alternatives for the proposed anesthesia with the patient or authorized representative who has indicated his/her understanding and acceptance.     Plan Discussed with: CRNA, Anesthesiologist and Surgeon  Anesthesia Plan Comments:         Anesthesia Quick Evaluation

## 2016-04-12 NOTE — Anesthesia Procedure Notes (Signed)
Procedure Name: Intubation Date/Time: 04/12/2016 8:01 AM Performed by: Myna Bright Pre-anesthesia Checklist: Patient identified, Emergency Drugs available, Suction available and Patient being monitored Patient Re-evaluated:Patient Re-evaluated prior to inductionOxygen Delivery Method: Circle system utilized Preoxygenation: Pre-oxygenation with 100% oxygen Intubation Type: IV induction Ventilation: Mask ventilation without difficulty Laryngoscope Size: Mac and 3 Grade View: Grade II Tube type: Oral Tube size: 7.5 mm Number of attempts: 1 Airway Equipment and Method: LTA kit utilized and Stylet Placement Confirmation: ETT inserted through vocal cords under direct vision,  positive ETCO2 and breath sounds checked- equal and bilateral Secured at: 22 cm Tube secured with: Tape Dental Injury: Teeth and Oropharynx as per pre-operative assessment

## 2016-04-12 NOTE — OR Nursing (Signed)
Dr. Kathyrn Sheriff at bedside.  Ms. Archambault answered questions appropriately, MAE x4 with equal strength, symmetrical.  Ms. Riso accurately described how many finger in all fields of gaze to the left.  Her only symptom was continued blurriness.  She stated it was about the same as it was pre-op.

## 2016-04-12 NOTE — Anesthesia Postprocedure Evaluation (Signed)
Anesthesia Post Note  Patient: Tracy Dodson  Procedure(s) Performed: Procedure(s) (LRB): CRANIOTOMY TUMOR EXCISION WITH BRAINLAB (N/A) APPLICATION OF CRANIAL NAVIGATION (N/A)  Patient location during evaluation: PACU Anesthesia Type: General Level of consciousness: awake, sedated and patient cooperative Pain management: pain level controlled Vital Signs Assessment: post-procedure vital signs reviewed and stable Respiratory status: spontaneous breathing and respiratory function stable Cardiovascular status: blood pressure returned to baseline and stable Anesthetic complications: no    Last Vitals:  Filed Vitals:   04/12/16 1200 04/12/16 1215  BP: 103/73 106/77  Pulse: 88 87  Temp:    Resp: 17 14    Last Pain:  Filed Vitals:   04/12/16 1221  PainSc: 0-No pain                 Airelle Everding EDWARD
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
## 2016-04-13 MED ORDER — DIPHENHYDRAMINE HCL 25 MG PO CAPS
50.0000 mg | ORAL_CAPSULE | Freq: Every evening | ORAL | Status: DC | PRN
  Administered 2016-04-13: 50 mg via ORAL
  Filled 2016-04-13: qty 2

## 2016-04-13 NOTE — Progress Notes (Signed)
Report called to [facility].

## 2016-04-13 NOTE — Op Note (Signed)
Name: Tracy Dodson    MRN: [id number]   Date: 04/11/2016    DOB: Aug 02, 1951   STEREOTACTIC RADIOSURGERY OPERATIVE NOTE  PRE-OPERATIVE DIAGNOSIS:  Metastatic non-small cell lung CA  POST-OPERATIVE DIAGNOSIS:  Same  PROCEDURE:  Stereotactic Radiosurgery  SURGEON:  Consuella Lose, MD  RADIATION ONCOLOGIST: Dr. Kyung Rudd, MD  TECHNIQUE:  The patient underwent a radiation treatment planning session in the radiation oncology simulation suite under the care of the radiation oncology physician and physicist.  I participated closely in the radiation treatment planning afterwards. The patient underwent planning CT which was fused to 3T high resolution MRI with 1 mm axial slices.  These images were fused on the planning system.  We contoured the gross target volumes and subsequently expanded this to yield the Planning Target Volume. I actively participated in the planning process.  I helped to define and review the target contours and also the contours of the optic pathway, eyes, brainstem and selected nearby organs at risk.  All the dose constraints for critical structures were reviewed and compared to AAPM Task Group 101.  The prescription dose conformity was reviewed.  I approved the plan electronically.    Accordingly, [name]  was brought to the TrueBeam stereotactic radiation treatment linac and placed in the custom immobilization mask.  The patient was aligned according to the IR fiducial markers with BrainLab Exactrac, then orthogonal x-rays were used in ExacTrac with the 6DOF robotic table and the shifts were made to align the patient  KEIOSHA CANCRO received stereotactic radiosurgery to a prescription dose of 14Gy to the right occipital lesion uneventfully.    The detailed description of the procedure is recorded in the radiation oncology procedure note.  I was present for the duration of the procedure.  DISPOSITION:   Following delivery, the patient was transported to nursing in  stable condition and monitored for possible acute effects to be discharged to home in stable condition with follow-up in one month.  Consuella Lose, MD [facility]

## 2016-04-13 NOTE — Progress Notes (Signed)
Patient transferred to 5C06 in a wheel chair accompanied by NT and patient husband and son.

## 2016-04-13 NOTE — Progress Notes (Signed)
No acute events AVSS Visual fields full Moving all extremities well MRI shows complete resection Stable TTF

## 2016-04-13 NOTE — Addendum Note (Signed)
Encounter addended by: Consuella Lose, MD on: 04/13/2016  4:09 PM<BR>     Documentation filed: Notes Section

## 2016-04-14 MED ORDER — LEVETIRACETAM 500 MG PO TABS
500.0000 mg | ORAL_TABLET | Freq: Two times a day (BID) | ORAL | Status: DC
  Administered 2016-04-14: 500 mg via ORAL
  Filled 2016-04-14: qty 1

## 2016-04-14 MED ORDER — DEXAMETHASONE 4 MG PO TABS
ORAL_TABLET | ORAL | Status: DC
Start: 2016-04-14 — End: 2016-06-11

## 2016-04-14 MED ORDER — LEVETIRACETAM 500 MG PO TABS: 500.0000 mg | ORAL_TABLET | Freq: Two times a day (BID) | ORAL | Status: DC

## 2016-04-14 MED ORDER — HYDROCODONE-ACETAMINOPHEN 5-325 MG PO TABS
1.0000 | ORAL_TABLET | ORAL | Status: DC | PRN
Start: 2016-04-14 — End: 2016-07-29

## 2016-04-14 NOTE — Discharge Summary (Signed)
Date of Admission: 04/12/2016  Date of Discharge: 04/14/2016  Admission Diagnosis: Right tentorial metastasis  Discharge Diagnosis: Same  Procedure Performed: Right occipital craniotomy for tumor resection  Attending: Consuella Lose, MD  Hospital Course:  The patient was admitted for the above listed operation and had an uncomplicated post-operative course.  They were discharged in stable condition.  Follow up: 3 weeks    Medication List    STOP taking these medications        traMADol 50 MG tablet  Commonly known as:  ULTRAM      TAKE these medications        acetaminophen 325 MG tablet  Commonly known as:  TYLENOL  Take 325 mg by mouth every 6 (six) hours as needed for moderate pain or headache.     b complex vitamins tablet  Take 1 tablet by mouth daily. Reported on 04/08/2016     BIOTIN 5000 PO  Take 10,000 mcg by mouth daily.     CALCIUM 600+D 600-200 MG-UNIT Tabs  Generic drug:  Calcium Carbonate-Vitamin D  Take 1 tablet by mouth daily.     CENTRUM SILVER PO  Take 1 capsule by mouth daily.     cholecalciferol 1000 units tablet  Commonly known as:  VITAMIN D  Take 4,000 Units by mouth daily.     citalopram 20 MG tablet  Commonly known as:  CELEXA  Take 20 mg by mouth daily.     dexamethasone 4 MG tablet  Commonly known as:  DECADRON  4 mg by mouth twice a day after meals     Fish Oil 1000 MG Caps  Take 1 capsule by mouth daily. Reported on 04/08/2016     FLORASTOR PO  Take 1 capsule by mouth daily. Reported on 8/41/3244     folic acid 1 MG tablet  Commonly known as:  FOLVITE  Take 1 tablet (1 mg total) by mouth daily.     HYDROcodone-acetaminophen 5-325 MG tablet  Commonly known as:  NORCO/VICODIN  Take 1 tablet by mouth every 4 (four) hours as needed for moderate pain.     levETIRAcetam 500 MG tablet  Commonly known as:  KEPPRA  Take 1 tablet (500 mg total) by mouth 2 (two) times daily.     lidocaine-prilocaine cream  Commonly known  as:  EMLA  Apply 1 application topically as needed.     LORazepam 0.5 MG tablet  Commonly known as:  ATIVAN  Take one tab po 30 minutes prior to MRI or radiation treatment.     losartan-hydrochlorothiazide 100-25 MG tablet  Commonly known as:  HYZAAR  Take 1 tablet by mouth daily.     Melatonin 5 MG Tabs  Take 5 mg by mouth at bedtime as needed. Reported on 04/08/2016     Milk Thistle 1000 MG Caps  Take 1 capsule by mouth daily.     ondansetron 8 MG tablet  Commonly known as:  ZOFRAN  Take 1 tablet (8 mg total) by mouth every 8 (eight) hours as needed for nausea or vomiting.     OVER THE COUNTER MEDICATION  Take 2 tablets by mouth at bedtime.     pantoprazole 40 MG tablet  Commonly known as:  PROTONIX  Take 1 tablet (40 mg total) by mouth daily. Switch for any other PPI at similar dose and frequency     potassium chloride SA 20 MEQ tablet  Commonly known as:  K-DUR,KLOR-CON  Take 40 mEq by mouth daily. Reported on  04/08/2016        

## 2016-04-14 NOTE — Discharge Instructions (Signed)
Craniotomy, Care After Refer to this sheet in the next few weeks. These instructions provide you with information on caring for yourself after your procedure. Your health care provider may also give you more specific instructions. Your treatment has been planned according to current medical practices, but problems sometimes occur. Call your health care provider if you have any problems or questions after your procedure. WHAT TO EXPECT AFTER THE PROCEDURE After your procedure, it is typical to have the following:  Your scalp may feel spongy for a while because of fluid under it. This will gradually get better.  You may have numbness for a while in some areas of your scalp. HOME CARE INSTRUCTIONS   Sleep and rest. When lying down, keep your head elevated at a 30 degree angle. This helps keep brain swelling down and prevents increased pressure in the head. This is important immediately after surgery. Ask your health care provider when you can go back to sleeping flat.   Change dressings as directed by your health care provider. You may need to have someone change your dressings for you.  Keep the wound clean and dry. Wash the wound gently with soap and water. Blot or dab the wound dry without rubbing it.  Wear a helmet as directed by your health care provider.  Only take over-the-counter or prescription medicines as directed by your health care provider. Do not take other medicines without asking your health care provider first.  Do not use alcohol.   Take showers if your health care provider approves. Cover the incision area before the shower as directed. Do not take baths or use swimming pools or hot tubs for 10 days or as directed by your health care provider.  Continue your normal diet as directed by your health care provider.   Do not drive until your health care provider approves.  Limit activities or movements as directed by your health care provider. You may take short walks if  your health care provider approves. Increase your activity gradually. Wait at least 3 months before you return to mild, noncontact sports. Avoid contact sports for at least 1 year or as directed by your health care provider.   Ask your health care provider when you can return to work.  Follow up with your health care provider as directed. Stitches or staples are usually removed about 1 week after surgery. SEEK MEDICAL CARE IF:  You have redness, swelling, or increasing pain in the wound or pin insertion sites.   You have drainage or pus coming from the wound.  SEEK IMMEDIATE MEDICAL CARE IF:   You feel nauseous, or you vomit.   You feel confused.  You have severe headaches.  You have a seizure.   You have chest pain, a stiff neck, or difficulty breathing.   You have an increase in swelling or bruising around the eyes.   You have a fever.   You notice a bad smell coming from the wound or dressing.   Your wound breaks open after the stitches or staples have been removed.   You have dizziness or faint while standing.   You have a rash.  MAKE SURE YOU:  Understand these instructions.  Will watch your condition.  Will get help right away if you are not doing well or get worse.   This information is not intended to replace advice given to you by your health care provider. Make sure you discuss any questions you have with your health care provider.  Document Released: 12/17/2005 Document Revised: 07/07/2013 Document Reviewed: 04/28/2013 Elsevier Interactive Patient Education [employer].

## 2016-04-14 NOTE — Progress Notes (Signed)
Pt d/c to home by car with family. Assessment stable. Prescriptions given. All questions answered. 

## 2016-04-14 NOTE — Progress Notes (Signed)
Doing well Neuro intact Incision looks good D/c home

## 2016-04-15 ENCOUNTER — Encounter: Payer: Self-pay | Admitting: Radiation Oncology

## 2016-04-15 ENCOUNTER — Encounter (HOSPITAL_COMMUNITY): Payer: Self-pay | Admitting: Neurosurgery

## 2016-04-15 NOTE — Progress Notes (Signed)
FMLA paperwork signed and returned to patient on 04/11/16

## 2016-04-18 ENCOUNTER — Telehealth: Payer: Self-pay | Admitting: Internal Medicine

## 2016-04-18 NOTE — Telephone Encounter (Signed)
S/w pt, adivsed appt time [facility] on 7/27 from 11.30to 1.45pm and advised she will see Lattie Haw as md is on pal. Pt verbalized understanding.

## 2016-04-25 ENCOUNTER — Encounter: Payer: Self-pay | Admitting: Radiation Oncology

## 2016-04-25 ENCOUNTER — Telehealth: Payer: Self-pay | Admitting: *Deleted

## 2016-04-25 ENCOUNTER — Other Ambulatory Visit (HOSPITAL_BASED_OUTPATIENT_CLINIC_OR_DEPARTMENT_OTHER): Payer: PRIVATE HEALTH INSURANCE

## 2016-04-25 ENCOUNTER — Ambulatory Visit (HOSPITAL_BASED_OUTPATIENT_CLINIC_OR_DEPARTMENT_OTHER): Payer: PRIVATE HEALTH INSURANCE | Admitting: Nurse Practitioner

## 2016-04-25 ENCOUNTER — Ambulatory Visit: Payer: PRIVATE HEALTH INSURANCE

## 2016-04-25 ENCOUNTER — Telehealth: Payer: Self-pay | Admitting: Internal Medicine

## 2016-04-25 VITALS — BP 114/73 | HR 98 | Temp 98.2°F | Resp 18 | Ht 65.0 in | Wt 147.0 lb

## 2016-04-25 DIAGNOSIS — C3491 Malignant neoplasm of unspecified part of right bronchus or lung: Secondary | ICD-10-CM

## 2016-04-25 DIAGNOSIS — C7931 Secondary malignant neoplasm of brain: Secondary | ICD-10-CM

## 2016-04-25 DIAGNOSIS — D6481 Anemia due to antineoplastic chemotherapy: Secondary | ICD-10-CM

## 2016-04-25 DIAGNOSIS — D6959 Other secondary thrombocytopenia: Secondary | ICD-10-CM | POA: Diagnosis not present

## 2016-04-25 DIAGNOSIS — C342 Malignant neoplasm of middle lobe, bronchus or lung: Secondary | ICD-10-CM

## 2016-04-25 DIAGNOSIS — Z5111 Encounter for antineoplastic chemotherapy: Secondary | ICD-10-CM

## 2016-04-25 DIAGNOSIS — T451X5A Adverse effect of antineoplastic and immunosuppressive drugs, initial encounter: Secondary | ICD-10-CM

## 2016-04-25 LAB — COMPREHENSIVE METABOLIC PANEL
ALT: 17 U/L (ref 0–55)
ANION GAP: 12 meq/L — AB (ref 3–11)
AST: 17 U/L (ref 5–34)
Albumin: 3.8 g/dL (ref 3.5–5.0)
Alkaline Phosphatase: 59 U/L (ref 40–150)
BUN: 19.2 mg/dL (ref 7.0–26.0)
CHLORIDE: 98 meq/L (ref 98–109)
CO2: 28 meq/L (ref 22–29)
Calcium: 8.9 mg/dL (ref 8.4–10.4)
Creatinine: 0.8 mg/dL (ref 0.6–1.1)
EGFR: 77 mL/min/{1.73_m2} — AB (ref >90)
GLUCOSE: 120 mg/dL (ref 70–140)
Potassium: 4.1 mEq/L (ref 3.5–5.1)
SODIUM: 137 meq/L (ref 136–145)
TOTAL PROTEIN: 6.3 g/dL — AB (ref 6.4–8.3)
Total Bilirubin: 1.02 mg/dL (ref 0.20–1.20)

## 2016-04-25 LAB — CBC WITH DIFFERENTIAL/PLATELET
BASO%: 0.1 % (ref 0.0–2.0)
BASOS ABS: 0 10*3/uL (ref 0.0–0.1)
EOS ABS: 0 10*3/uL (ref 0.0–0.5)
EOS%: 0 % (ref 0.0–7.0)
HCT: 32.5 % — ABNORMAL LOW (ref 34.8–46.6)
HEMOGLOBIN: 10.9 g/dL — AB (ref 11.6–15.9)
LYMPH%: 3 % — AB (ref 14.0–49.7)
MCH: 34.4 pg — ABNORMAL HIGH (ref 25.1–34.0)
MCHC: 33.5 g/dL (ref 31.5–36.0)
MCV: 102.5 fL — AB (ref 79.5–101.0)
MONO#: 1.2 10*3/uL — ABNORMAL HIGH (ref 0.1–0.9)
MONO%: 6.8 % (ref 0.0–14.0)
NEUT#: 15.9 10*3/uL — ABNORMAL HIGH (ref 1.5–6.5)
NEUT%: 90.1 % — AB (ref 38.4–76.8)
Platelets: 87 10*3/uL — ABNORMAL LOW (ref 145–400)
RBC: 3.17 10*6/uL — AB (ref 3.70–5.45)
RDW: 20 % — ABNORMAL HIGH (ref 11.2–14.5)
WBC: 17.7 10*3/uL — ABNORMAL HIGH (ref 3.9–10.3)
lymph#: 0.5 10*3/uL — ABNORMAL LOW (ref 0.9–3.3)

## 2016-04-25 LAB — TECHNOLOGIST REVIEW

## 2016-04-25 NOTE — Telephone Encounter (Signed)
Gave pt cal & avs, lisa will talk to MD about apt

## 2016-04-25 NOTE — Progress Notes (Signed)
Received disability paperwork from [place], forwarded back for signature.

## 2016-04-25 NOTE — Progress Notes (Signed)
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   PRIOR THERAPY:  1) Neoadjuvant concurrent chemoradiation with chemotherapy in the form of weekly carboplatin for an AUC of 2 and paclitaxel 45 mg per meter squared, last dose was given 11/20/2012 with partial response.  2) Right video-assisted thoracoscopy and thoracotomy, right middle lobectomy, wedge resection of superior segment of right lower lobe, lymph node sampling under the care of Dr. Servando Snare on 02/08/2013.  3) Tarceva 150 mg by mouth daily, started 01/20/2014. Status post 6 months of treatment discontinued secondary to uncontrolled grade 2-3 skin rash. 4) Tarceva 100 mg by mouth daily, started November 2015. Status post 12 months of treatment, discontinued secondary to disease progression. 2) Gilotrif 30 mg by mouth daily started 11/01/2015, status post 2 months of treatment discontinued secondary to disease progression.  CURRENT THERAPY: Systemic chemotherapy with carboplatin for AUC of 5 and Alimta 500 MG/M2. First dose 01/04/2016. Status post 5 cycles.   INTERVAL HISTORY:   Tracy Dodson returns as scheduled. She completed cycle 5 carboplatin/Alimta 03/28/2016. She was hospitalized 04/01/2016 with nausea/vomiting, headache, vision change. Brain MRI showed a greater than 2 cm solitary right occipital metastasis was slight hemorrhage and central necrosis. There was marked associate vasogenic edema. She received SRS to the occipital lesion on 04/11/2016. On 04/12/2016 she underwent right occipital craniotomy for tumor resection. Pathology showed metastatic adenocarcinoma.  She is no longer having headaches or nausea/vomiting. She continues to have mild blurring of vision on the left. She  notes that her balance is "off". Main complaint is fatigue. She denies shortness of breath. No cough. She reports a good appetite.  Objective:  Vital signs in last 24 hours:  Blood pressure 114/73, pulse 98, temperature 98.2 F (36.8 C), temperature source Oral, resp. rate 18, height _0  (1.651 m), weight 147 lb (66.7 kg), SpO2 99 %.    HEENT: Pupils equal and reactive to light. Extraocular movements intact. Sclera anicteric. Oropharynx is without thrush. Resp: Lungs clear bilaterally. Cardio: Regular rate and rhythm. GI: Abdomen soft and nontender. No hepatomegaly. Vascular: No leg edema. Calves soft and nontender. Neuro: Alert and oriented.  Skin: No rash. Port-A-Cath without erythema.    Lab Results:  Lab Results  Component Value Date   WBC 17.7 (H) 04/25/2016   HGB 10.9 (L) 04/25/2016   HCT 32.5 (L) 04/25/2016   MCV 102.5 (H) 04/25/2016   PLT 87 (L) 04/25/2016   NEUTROABS 15.9 (H) 04/25/2016    Imaging:  No results found.  Medications: I have reviewed the patient's current medications.  Assessment/Plan: 1. Metastatic non-small cell lung cancer currently on active treatment with carboplatin/Alimta. Cycle 5 given on 03/28/2016. Admitted 04/01/2016 with nausea/vomiting, headache and visual disturbance ounce have a solitary right occipital metastasis. She is status post [facility] 04/11/2016 and surgical resection 04/12/2016. Pathology confirmed metastatic adenocarcinoma. 2. Solitary right occipital metastasis 04/01/2016. [facility] 04/11/2016. Surgical resection 04/12/2016. 3. Thrombocytopenia secondary to chemotherapy.  Disposition: Tracy Dodson has metastatic non-small cell lung cancer. She completed cycle 5 carboplatin/Alimta 03/28/2016. She was subsequently admitted and found to have a right occipital metastasis, now status post SRS and surgical resection. Last restaging CT evaluation was on 02/29/2016 showing a mixed response in the lungs.  She has thrombocytopenia on labs today  likely secondary to chemotherapy.  She will contact the office with any bleeding.  I reviewed her case with Dr. Benay Spice in [name] absence. We decided to hold today's treatment and refer her for a restaging CT scan of the chest. She will return for a follow-up visit with Dr. Julien Nordmann in the next week or so to review the results. She will contact the office in the interim with any problems.  30 minutes were spent face-to-face at today's visit with the majority of that time involved in counseling/coordination of care.      Ned Card ANP/GNP-BC   04/25/2016  2:22 PM

## 2016-04-25 NOTE — Telephone Encounter (Signed)
Per staff message and POF I have scheduled appts. Advised scheduler of appts. JMW  

## 2016-05-01 ENCOUNTER — Encounter (HOSPITAL_COMMUNITY): Payer: Self-pay

## 2016-05-01 ENCOUNTER — Ambulatory Visit (HOSPITAL_COMMUNITY)
Admission: RE | Admit: 2016-05-01 | Discharge: 2016-05-01 | Disposition: A | Discharge status: Home / Self Care | Payer: PRIVATE HEALTH INSURANCE | Source: Ambulatory Visit | Attending: Nurse Practitioner | Admitting: Nurse Practitioner

## 2016-05-01 DIAGNOSIS — I2510 Atherosclerotic heart disease of native coronary artery without angina pectoris: Secondary | ICD-10-CM | POA: Diagnosis not present

## 2016-05-01 DIAGNOSIS — C7931 Secondary malignant neoplasm of brain: Secondary | ICD-10-CM | POA: Insufficient documentation

## 2016-05-01 DIAGNOSIS — C342 Malignant neoplasm of middle lobe, bronchus or lung: Secondary | ICD-10-CM | POA: Insufficient documentation

## 2016-05-01 DIAGNOSIS — K8020 Calculus of gallbladder without cholecystitis without obstruction: Secondary | ICD-10-CM | POA: Diagnosis not present

## 2016-05-01 DIAGNOSIS — R918 Other nonspecific abnormal finding of lung field: Secondary | ICD-10-CM | POA: Insufficient documentation

## 2016-05-01 MED ORDER — IOPAMIDOL (ISOVUE-300) INJECTION 61%
75.0000 mL | Freq: Once | INTRAVENOUS | Status: AC | PRN
  Administered 2016-05-01: 75 mL via INTRAVENOUS

## 2016-05-02 ENCOUNTER — Encounter: Payer: Self-pay | Admitting: Radiation Oncology

## 2016-05-02 ENCOUNTER — Telehealth: Payer: Self-pay | Admitting: Internal Medicine

## 2016-05-02 ENCOUNTER — Telehealth: Payer: Self-pay | Admitting: Medical Oncology

## 2016-05-02 NOTE — Telephone Encounter (Signed)
I left a message for pt to come tomorrow to see Julien Nordmann to review molecular testing results.

## 2016-05-02 NOTE — Telephone Encounter (Signed)
per pof to sch pt appt-CX inf for 8/4-per pof pt aware of appt time for 8/4

## 2016-05-02 NOTE — Progress Notes (Signed)
Paperwork received from doctor, called patient, she advised no further assistance needed she would come pick up paperwork from front desk (Ms Wilma's desk) 8/3

## 2016-05-03 ENCOUNTER — Telehealth: Payer: Self-pay | Admitting: Internal Medicine

## 2016-05-03 ENCOUNTER — Ambulatory Visit: Payer: PRIVATE HEALTH INSURANCE

## 2016-05-03 ENCOUNTER — Encounter: Payer: Self-pay | Admitting: Internal Medicine

## 2016-05-03 ENCOUNTER — Other Ambulatory Visit (HOSPITAL_BASED_OUTPATIENT_CLINIC_OR_DEPARTMENT_OTHER): Payer: PRIVATE HEALTH INSURANCE

## 2016-05-03 ENCOUNTER — Ambulatory Visit (HOSPITAL_BASED_OUTPATIENT_CLINIC_OR_DEPARTMENT_OTHER): Payer: PRIVATE HEALTH INSURANCE | Admitting: Internal Medicine

## 2016-05-03 ENCOUNTER — Ambulatory Visit: Payer: PRIVATE HEALTH INSURANCE | Admitting: Internal Medicine

## 2016-05-03 ENCOUNTER — Other Ambulatory Visit: Payer: PRIVATE HEALTH INSURANCE

## 2016-05-03 VITALS — BP 109/65 | HR 92 | Temp 98.5°F | Resp 18 | Ht 65.0 in | Wt 149.9 lb

## 2016-05-03 DIAGNOSIS — C342 Malignant neoplasm of middle lobe, bronchus or lung: Secondary | ICD-10-CM | POA: Diagnosis not present

## 2016-05-03 DIAGNOSIS — C7931 Secondary malignant neoplasm of brain: Secondary | ICD-10-CM

## 2016-05-03 DIAGNOSIS — C3431 Malignant neoplasm of lower lobe, right bronchus or lung: Secondary | ICD-10-CM

## 2016-05-03 DIAGNOSIS — D6481 Anemia due to antineoplastic chemotherapy: Secondary | ICD-10-CM

## 2016-05-03 DIAGNOSIS — Z5111 Encounter for antineoplastic chemotherapy: Secondary | ICD-10-CM

## 2016-05-03 DIAGNOSIS — T451X5A Adverse effect of antineoplastic and immunosuppressive drugs, initial encounter: Secondary | ICD-10-CM

## 2016-05-03 LAB — COMPREHENSIVE METABOLIC PANEL
ALT: 19 U/L (ref 0–55)
AST: 20 U/L (ref 5–34)
Albumin: 4 g/dL (ref 3.5–5.0)
Alkaline Phosphatase: 52 U/L (ref 40–150)
Anion Gap: 11 mEq/L (ref 3–11)
BUN: 14.5 mg/dL (ref 7.0–26.0)
CALCIUM: 9.2 mg/dL (ref 8.4–10.4)
CHLORIDE: 97 meq/L — AB (ref 98–109)
CO2: 29 mEq/L (ref 22–29)
CREATININE: 0.8 mg/dL (ref 0.6–1.1)
EGFR: 84 mL/min/{1.73_m2} — ABNORMAL LOW (ref >90)
GLUCOSE: 95 mg/dL (ref 70–140)
Potassium: 3.3 mEq/L — ABNORMAL LOW (ref 3.5–5.1)
SODIUM: 137 meq/L (ref 136–145)
Total Bilirubin: 1.11 mg/dL (ref 0.20–1.20)
Total Protein: 6.5 g/dL (ref 6.4–8.3)

## 2016-05-03 LAB — CBC WITH DIFFERENTIAL/PLATELET
BASO%: 0 % (ref 0.0–2.0)
Basophils Absolute: 0 10*3/uL (ref 0.0–0.1)
EOS%: 0 % (ref 0.0–7.0)
Eosinophils Absolute: 0 10*3/uL (ref 0.0–0.5)
HEMATOCRIT: 34.3 % — AB (ref 34.8–46.6)
HEMOGLOBIN: 11.3 g/dL — AB (ref 11.6–15.9)
LYMPH#: 0.5 10*3/uL — AB (ref 0.9–3.3)
LYMPH%: 3.4 % — ABNORMAL LOW (ref 14.0–49.7)
MCH: 34.1 pg — ABNORMAL HIGH (ref 25.1–34.0)
MCHC: 32.9 g/dL (ref 31.5–36.0)
MCV: 103.8 fL — ABNORMAL HIGH (ref 79.5–101.0)
MONO#: 2.5 10*3/uL — ABNORMAL HIGH (ref 0.1–0.9)
MONO%: 17.7 % — ABNORMAL HIGH (ref 0.0–14.0)
NEUT#: 11 10*3/uL — ABNORMAL HIGH (ref 1.5–6.5)
NEUT%: 78.9 % — AB (ref 38.4–76.8)
Platelets: 89 10*3/uL — ABNORMAL LOW (ref 145–400)
RBC: 3.3 10*6/uL — ABNORMAL LOW (ref 3.70–5.45)
RDW: 19.7 % — AB (ref 11.2–14.5)
WBC: 13.9 10*3/uL — ABNORMAL HIGH (ref 3.9–10.3)

## 2016-05-03 LAB — TECHNOLOGIST REVIEW

## 2016-05-03 MED ORDER — OSIMERTINIB MESYLATE 80 MG PO TABS: 80.0000 mg | ORAL_TABLET | Freq: Every day | ORAL | 2 refills | Status: DC
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
## 2016-05-03 NOTE — Telephone Encounter (Signed)
G pt appt for 05/23/16.

## 2016-05-05 ENCOUNTER — Encounter: Payer: Self-pay | Admitting: Internal Medicine

## 2016-05-06 ENCOUNTER — Encounter: Payer: Self-pay | Admitting: Internal Medicine

## 2016-05-08 ENCOUNTER — Encounter: Payer: Self-pay | Admitting: Internal Medicine

## 2016-05-09 ENCOUNTER — Encounter (HOSPITAL_COMMUNITY): Payer: Self-pay

## 2016-05-10 ENCOUNTER — Telehealth: Payer: Self-pay | Admitting: Medical Oncology

## 2016-05-10 ENCOUNTER — Encounter: Payer: Self-pay | Admitting: Pharmacist

## 2016-05-10 NOTE — Progress Notes (Signed)
Oral Chemotherapy Pharmacist Encounter   Received notification from [place] that [facility] prescription would require prior authorization. Oral Chemo Clinic had not initiated the original prescription with [facility]. Rx checked for DDI's and lab interactions, none identified.  Prior authorization requested through [email or web address], [place] is pending. Key# [facility] [place] case ID: [id number]  Oral Chemo Clinic will continue to follow.  Johny Drilling, PharmD, BCPS Oral Chemotherapy Clinic

## 2016-05-10 NOTE — Telephone Encounter (Signed)
responded to pt email -Tagrisso requires prior auth and our pharmacy is working on it -

## 2016-05-13 ENCOUNTER — Ambulatory Visit
Admission: RE | Admit: 2016-05-13 | Discharge: 2016-05-13 | Disposition: A | Discharge status: Home / Self Care | Payer: PRIVATE HEALTH INSURANCE | Source: Ambulatory Visit | Attending: Radiation Oncology | Admitting: Radiation Oncology

## 2016-05-13 ENCOUNTER — Encounter: Payer: Self-pay | Admitting: Radiation Oncology

## 2016-05-13 VITALS — BP 138/85 | HR 110 | Temp 98.7°F | Resp 20 | Ht 65.5 in | Wt 150.7 lb

## 2016-05-13 DIAGNOSIS — C342 Malignant neoplasm of middle lobe, bronchus or lung: Secondary | ICD-10-CM

## 2016-05-13 DIAGNOSIS — C3490 Malignant neoplasm of unspecified part of unspecified bronchus or lung: Secondary | ICD-10-CM | POA: Diagnosis present

## 2016-05-13 DIAGNOSIS — C7931 Secondary malignant neoplasm of brain: Secondary | ICD-10-CM | POA: Insufficient documentation

## 2016-05-13 DIAGNOSIS — H538 Other visual disturbances: Secondary | ICD-10-CM | POA: Insufficient documentation

## 2016-05-13 NOTE — Progress Notes (Signed)
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 ExacTrac Snap verification was performed for each couch angle.  10/20/2012 through 11/27/2012: Right chest region 50.4 Gy in 28 fractions  Narrative:  The patient returns today for routine follow-up. She followed up with Dr. Julien Nordmann on 05/03/16 and she is scheduled to begin taking Tagrisso and is waiting to start this upon confirmation from her insurance.The patient is taking 0.5 mg Decadron daily, and this continues to be tapered per instructions after radiotherapy.  On review of systems, the patient denies headaches, but has difficulty with balance, dizziness, and an increase in blurry vision. She has has fatigue and states that food tastes funny. No other complaints were verbalized.    ALLERGIES:  is allergic to compazine [prochlorperazine edisylate] and contrast media [iodinated diagnostic agents].  Meds: Current Outpatient Prescriptions  Medication Sig Dispense Refill  . acetaminophen (TYLENOL) 325 MG tablet Take 325 mg by mouth every 6 (six) hours as needed for moderate pain or headache.     . b complex vitamins tablet Take 1 tablet by mouth daily. Reported on 04/08/2016    . BIOTIN 5000 PO Take 10,000 mcg by mouth daily.     . Calcium Carbonate-Vitamin D (CALCIUM 600+D) 600-200 MG-UNIT TABS Take 1 tablet by mouth daily.    . cholecalciferol (VITAMIN D) 1000 UNITS tablet Take 4,000 Units by mouth daily.    [name]  dexamethasone (DECADRON) 4 MG tablet 4 mg by mouth twice a day after meals 40 tablet 0  . folic acid (FOLVITE) 1 MG tablet Take 1 tablet (1 mg total) by mouth daily. 30 tablet 4  . levETIRAcetam (KEPPRA) 500 MG tablet Take 1 tablet (500 mg total) by mouth 2 (two) times daily. 60 tablet 2  . lidocaine-prilocaine (EMLA) cream Apply 1 application topically as needed. 30 g 0  . Melatonin 5 MG TABS Take 5 mg by mouth at bedtime as needed. Reported on 04/08/2016    . Milk Thistle 1000 MG CAPS Take 1 capsule by mouth daily.    . Multiple Vitamins-Minerals (CENTRUM SILVER PO) Take 1 capsule by mouth daily.    . Omega-3 Fatty Acids (FISH OIL) 1000 MG CAPS Take 1 capsule by mouth daily. Reported on 04/08/2016    . OVER THE COUNTER MEDICATION Take 2 tablets by mouth at bedtime.    . pantoprazole (PROTONIX) 40 MG tablet Take 1 tablet (40 mg total) by mouth daily. Switch for any other PPI at similar dose and frequency 30 tablet 0  . PREVIDENT 5000 DRY MOUTH 1.1 % GEL dental gel See admin instructions.  0  . HYDROcodone-acetaminophen (NORCO/VICODIN) 5-325 MG tablet Take 1 tablet by mouth every 4 (four) hours as needed for moderate pain. (Patient not taking: Reported on 05/03/2016) 60 tablet 0  . LORazepam (ATIVAN) 0.5 MG tablet Take one tab po 30 minutes prior to MRI or radiation treatment. (Patient not taking: Reported on 05/13/2016) 30 tablet 0  . losartan-hydrochlorothiazide (  HYZAAR) 100-25 MG per tablet Take 1 tablet by mouth daily.    . ondansetron (ZOFRAN) 8 MG tablet Take 1 tablet (8 mg total) by mouth every 8 (eight) hours as needed for nausea or vomiting. (Patient not taking: Reported on 05/03/2016) 20 tablet 0  . osimertinib mesylate (TAGRISSO) 80 MG tablet Take 1 tablet (80 mg total) by mouth daily. (Patient not taking: Reported on 05/13/2016) 30 tablet 2  . Saccharomyces boulardii (FLORASTOR PO) Take 1 capsule by mouth daily. Reported on 04/08/2016     No current facility-administered medications for this  encounter.     Physical Findings:  height is 5' 5.5" (1.664 m) and weight is 150 lb 11.2 oz (68.4 kg). Her oral temperature is 98.7 F (37.1 C). Her blood pressure is 138/85 and her pulse is 110 (abnormal). Her respiration is 20 and oxygen saturation is 100%.  In general this is a well appearing Caucasian female in no acute distress. She's alert and oriented x4 and appropriate throughout the examination. Cardiopulmonary assessment is negative for acute distress and she exhibits normal effort. Her posterior skull craniotomy incision at the midline is well healed.  Lab Findings: Lab Results  Component Value Date   WBC 13.9 (H) 05/03/2016   HGB 11.3 (L) 05/03/2016   HCT 34.3 (L) 05/03/2016   MCV 103.8 (H) 05/03/2016   PLT 89 (L) 05/03/2016     Radiographic Findings: Ct Chest W Contrast  Result Date: 05/01/2016 CLINICAL DATA:  Lung cancer with brain metastases. EXAM: CT CHEST WITH CONTRAST TECHNIQUE: Multidetector CT imaging of the chest was performed during intravenous contrast administration. CONTRAST:  31m ISOVUE-300 IOPAMIDOL (ISOVUE-300) INJECTION 61% COMPARISON:  02/29/2016. FINDINGS: Cardiovascular: Heart size is normal. No pericardial effusion. Coronary artery calcification is noted. No thoracic aortic aneurysm. Mediastinum/Nodes: No mediastinal lymphadenopathy. There is no hilar lymphadenopathy. There is no axillary lymphadenopathy. The esophagus has normal imaging features. Right Port-A-Cath tip is positioned in the distal SVC. Lungs/Pleura: Volume loss in the right hemi thorax is compatible with the reported history of right middle lobectomy and suture line right lower lobe compatible with wedge resection. Continued progression of pulmonary nodules noted. 12 x 9 mm nodule seen previously in the posterior right lung base is 13 x 10 mm today (image 107 series 5). This particular nodule is one of the lesions that had shown decrease in size on the previous study, but has now progressed since  that exam. Anterior left lower lobe nodule measured previously at 7 x 8 mm is now with 9 x 10 mm (image 114). Lateral nodule left costophrenic sulcus measured previously at 9 x 4 mm now measures 11 x 7 mm (image 124). Posterior left costophrenic sulcus nodule measured previously at 11 x 12 mm now measures 12 x 14 mm (image 131). No definite new pulmonary nodules are identified. No focal airspace consolidation. No pulmonary edema or pleural effusion. Upper Abdomen: 12 mm calcified gallstone noted. Small hiatal hernia. Left adrenal nodule measures 1.6 x 2.6 cm today, slightly progressed in the interval. Musculoskeletal: Bone windows reveal no worrisome lytic or sclerotic osseous lesions. IMPRESSION: 1. Interval progression of bilateral pulmonary nodules. 2. Interval progression of left heterogeneously enhancing adrenal nodule, continued close attention recommended. 3. Cholelithiasis. 4. Coronary artery atherosclerosis. Electronically Signed   By: EMisty StanleyM.D.   On: 05/01/2016 10:02    Impression/Plan:  1. 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 I would recommend a repeat MRI scan in 3 months time. We will follow up with her at that time to review the results after presenting the scan in our oncology brain conference. 2. Blurry Vision. I have encouraged her to follow up with opthalmology since radiotherapy to establish a baseline examination. She states agreement and understanding.    Carola Rhine, PAC  This document serves as a record of services personally performed by Shona Simpson, PA-C. It was created on her behalf by Darcus Austin, a trained medical scribe. The creation of this record is based on the scribe's personal observations and the provider's statements to them. This  document has been checked and approved by the attending provider.

## 2016-05-13 NOTE — Progress Notes (Signed)
foloow up s/p SRS brain 04/11/2016, takes 0.'5mg'$   Decadron daily,tapered,  Looks like thrush on tonfue, oods tastes funny like it is coated stated patient, takes biotin, no head aches, dizzy ness or light headed, no nausea, does have increased blurry vision, moon faced, not taking the [place] yet,  pharmacy and ins, questions still,  Saw Dr. Julien Nordmann 05/03/16 and follow up 05/23/16, very fatigued  1:49 PM BP 138/85 (BP Location: Right Arm, Patient Position: Sitting, Cuff Size: Normal)   Pulse (!) 110   Temp 98.7 F (37.1 C) (Oral)   Resp 20   Ht 5' 5.5" (1.664 m)   Wt 150 lb 11.2 oz (68.4 kg)   SpO2 100%   BMI 24.70 kg/m . Wt Readings from Last 3 Encounters:  05/13/16 150 lb 11.2 oz (68.4 kg)  05/03/16 149 lb 14.4 oz (68 kg)  04/25/16 147 lb (66.7 kg)

## 2016-05-14 ENCOUNTER — Encounter: Payer: Self-pay | Admitting: Pharmacist

## 2016-05-14 NOTE — Progress Notes (Signed)
Oral Chemotherapy Pharmacist Encounter   Called [employer] to follow up on prior authorization request for Tagrisso '80mg'$  submitted on 8/11. I was told case is still under review and we should hear something this week. I was also told that there were no flags on the case so no other information was needed at this time. PA Key# [facility] PA case ID# [id number]  Oral Chemo Clinic will continue to follow.  Johny Drilling, PharmD, BCPS Oral Chemotherapy Clinic

## 2016-05-20 ENCOUNTER — Encounter: Payer: Self-pay | Admitting: Internal Medicine

## 2016-05-20 ENCOUNTER — Telehealth: Payer: Self-pay | Admitting: Pharmacist

## 2016-05-20 MED FILL — *TAGRISSO 80MG TABLET: 80 | 30 days supply | Qty: 30 | Fill #0

## 2016-05-20 NOTE — Telephone Encounter (Signed)
Oral Chemotherapy Pharmacist Encounter   Received notification from [employer] that patient's Newman Nip has been approved through August 2018. Called [facility] to run prescription. Co-pay $0, they had already reached out to patient to pick up tomorrow 8/22.  Patient plans to start [place] on 8/22. Pt has been on Tarceva in the past and is familiar with the EGFR TKI side effect profile. Pt was counseled on side effects including, but not limited to diarrhea, skin rash, decreased blood counts, breathing changes, and vision changes. Pt inquired about use of probiotic since that was very helpful with the Tarceva GI toxicity. OK to use probiotic, may have to re-evaulate if patient becomes neutropenic. Pt counseled to take Zuehl with or without food at the same time each day, pt plans to take Tagrisso in the morning.  Discussed with pt that she has MD follow-up scheduled for 8/24, she will call schedulers to move appointment to 2 weeks from now to allow some time to be on [place] before seeing Dr. Julien Nordmann again.  Pt voiced understanding and appreciation.  Oral Chemo Clinic will conitnue to follow.  Johny Drilling, PharmD, BCPS Oral Chemotherapy Clinic

## 2016-05-20 NOTE — Progress Notes (Signed)
See prev notes- I let Janett Billow know and sent to medical recrds. tagrisso approved thru 05/17/17 case APP [id number] appeal approved.

## 2016-05-21 ENCOUNTER — Encounter: Payer: Self-pay | Admitting: Radiation Oncology

## 2016-05-21 ENCOUNTER — Encounter: Payer: Self-pay | Admitting: Internal Medicine

## 2016-05-21 ENCOUNTER — Telehealth: Payer: Self-pay | Admitting: Medical Oncology

## 2016-05-21 NOTE — Telephone Encounter (Signed)
Started tagrisso today so I sent message to move f/u to two weeks

## 2016-05-23 ENCOUNTER — Ambulatory Visit: Payer: PRIVATE HEALTH INSURANCE | Admitting: Internal Medicine

## 2016-05-23 ENCOUNTER — Other Ambulatory Visit: Payer: PRIVATE HEALTH INSURANCE

## 2016-05-28 ENCOUNTER — Other Ambulatory Visit: Payer: Self-pay | Admitting: Medical Oncology

## 2016-05-28 ENCOUNTER — Encounter: Payer: Self-pay | Admitting: Medical Oncology

## 2016-05-28 NOTE — Telephone Encounter (Signed)
folic acid discontinued

## 2016-05-29 ENCOUNTER — Encounter: Payer: Self-pay | Admitting: Radiation Oncology

## 2016-05-30 ENCOUNTER — Telehealth: Payer: Self-pay | Admitting: Pharmacist

## 2016-05-30 NOTE — Telephone Encounter (Signed)
Oral Chemotherapy Pharmacist Encounter   I spoke with patient today for follow up of oral chemo medication Tagrisso.  Start date 05/21/16 Pt reports missing 0 doses since starting.  Pt reports she is tolerating medication very well. She has experienced 1-2 episodes of diarrhea since starting Tagrisso, and was sure to mention this is NOT a daily occurrence. Pt reports fatigue and peripheral blurred vision are unchanged since starting medication. She has follow-up with an ophthalmologist on 06/10/16. She also still has brittle nails, unchanged since starting Tagrisso. Pt denies headache, mouth sores, cough, constipation, skin rash, or back pain.  I confirmed patient's next MD appt for 9/12. Oral Chemo Clinic will follow up this OV and check in with patient 1-2 weeks after that for toxicity/complaince management.  Pt expressed understanding and gratitude. Pt was counseled to call the clcinic with any further questions or concerns.  Johny Drilling, PharmD, BCPS 05/30/2016  4:34 PM Oral Chemotherapy Clinic [phone]

## 2016-06-06 ENCOUNTER — Other Ambulatory Visit (HOSPITAL_COMMUNITY)
Admission: RE | Admit: 2016-06-06 | Discharge: 2016-06-06 | Disposition: A | Discharge status: Home / Self Care | Payer: PRIVATE HEALTH INSURANCE | Source: Ambulatory Visit | Attending: Family Medicine | Admitting: Family Medicine

## 2016-06-06 ENCOUNTER — Other Ambulatory Visit: Payer: Self-pay | Admitting: Family Medicine

## 2016-06-06 DIAGNOSIS — Z1231 Encounter for screening mammogram for malignant neoplasm of breast: Secondary | ICD-10-CM

## 2016-06-06 DIAGNOSIS — Z01419 Encounter for gynecological examination (general) (routine) without abnormal findings: Secondary | ICD-10-CM | POA: Diagnosis not present

## 2016-06-11 ENCOUNTER — Ambulatory Visit (HOSPITAL_BASED_OUTPATIENT_CLINIC_OR_DEPARTMENT_OTHER): Payer: PRIVATE HEALTH INSURANCE | Admitting: Internal Medicine

## 2016-06-11 ENCOUNTER — Telehealth: Payer: Self-pay | Admitting: Internal Medicine

## 2016-06-11 ENCOUNTER — Encounter: Payer: Self-pay | Admitting: Internal Medicine

## 2016-06-11 ENCOUNTER — Other Ambulatory Visit (HOSPITAL_BASED_OUTPATIENT_CLINIC_OR_DEPARTMENT_OTHER): Payer: PRIVATE HEALTH INSURANCE

## 2016-06-11 VITALS — BP 134/73 | HR 110 | Temp 98.4°F | Resp 17 | Ht 65.5 in | Wt 148.9 lb

## 2016-06-11 DIAGNOSIS — C7931 Secondary malignant neoplasm of brain: Secondary | ICD-10-CM

## 2016-06-11 DIAGNOSIS — C3431 Malignant neoplasm of lower lobe, right bronchus or lung: Secondary | ICD-10-CM

## 2016-06-11 DIAGNOSIS — C342 Malignant neoplasm of middle lobe, bronchus or lung: Secondary | ICD-10-CM

## 2016-06-11 DIAGNOSIS — Z5111 Encounter for antineoplastic chemotherapy: Secondary | ICD-10-CM

## 2016-06-11 LAB — COMPREHENSIVE METABOLIC PANEL
ALBUMIN: 3.7 g/dL (ref 3.5–5.0)
ALK PHOS: 49 U/L (ref 40–150)
ALT: 14 U/L (ref 0–55)
AST: 27 U/L (ref 5–34)
Anion Gap: 9 mEq/L (ref 3–11)
BUN: 9.2 mg/dL (ref 7.0–26.0)
CALCIUM: 9.1 mg/dL (ref 8.4–10.4)
CO2: 26 mEq/L (ref 22–29)
CREATININE: 0.8 mg/dL (ref 0.6–1.1)
Chloride: 107 mEq/L (ref 98–109)
EGFR: 77 mL/min/{1.73_m2} — ABNORMAL LOW (ref >90)
GLUCOSE: 95 mg/dL (ref 70–140)
POTASSIUM: 4.2 meq/L (ref 3.5–5.1)
SODIUM: 141 meq/L (ref 136–145)
TOTAL PROTEIN: 6.7 g/dL (ref 6.4–8.3)
Total Bilirubin: 0.6 mg/dL (ref 0.20–1.20)

## 2016-06-11 LAB — CBC WITH DIFFERENTIAL/PLATELET
BASO%: 0.2 % (ref 0.0–2.0)
Basophils Absolute: 0 10*3/uL (ref 0.0–0.1)
EOS%: 0.2 % (ref 0.0–7.0)
Eosinophils Absolute: 0 10*3/uL (ref 0.0–0.5)
HEMATOCRIT: 30.5 % — AB (ref 34.8–46.6)
HEMOGLOBIN: 9.5 g/dL — AB (ref 11.6–15.9)
LYMPH#: 0.5 10*3/uL — AB (ref 0.9–3.3)
LYMPH%: 11.6 % — ABNORMAL LOW (ref 14.0–49.7)
MCH: 31.6 pg (ref 25.1–34.0)
MCHC: 31.1 g/dL — ABNORMAL LOW (ref 31.5–36.0)
MCV: 101.4 fL — ABNORMAL HIGH (ref 79.5–101.0)
MONO#: 1.3 10*3/uL — ABNORMAL HIGH (ref 0.1–0.9)
MONO%: 30.4 % — AB (ref 0.0–14.0)
NEUT%: 57.6 % (ref 38.4–76.8)
NEUTROS ABS: 2.4 10*3/uL (ref 1.5–6.5)
Platelets: 73 10*3/uL — ABNORMAL LOW (ref 145–400)
RBC: 3.01 10*6/uL — ABNORMAL LOW (ref 3.70–5.45)
RDW: 17.9 % — AB (ref 11.2–14.5)
WBC: 4.2 10*3/uL (ref 3.9–10.3)

## 2016-06-11 LAB — CYTOLOGY - PAP
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
## 2016-06-11 NOTE — Telephone Encounter (Signed)
GAVE PATIENT AVS REPORT AND APPOINTMENTS FOR September.  °

## 2016-06-12 ENCOUNTER — Encounter: Payer: Self-pay | Admitting: Radiation Oncology

## 2016-06-12 ENCOUNTER — Other Ambulatory Visit: Payer: Self-pay | Admitting: Radiation Therapy

## 2016-06-12 DIAGNOSIS — C7949 Secondary malignant neoplasm of other parts of nervous system: Principal | ICD-10-CM

## 2016-06-12 DIAGNOSIS — C7931 Secondary malignant neoplasm of brain: Secondary | ICD-10-CM

## 2016-06-14 MED FILL — *TAGRISSO 80MG TABLET: 80 | 30 days supply | Qty: 30 | Fill #1

## 2016-06-15 ENCOUNTER — Encounter: Payer: Self-pay | Admitting: Internal Medicine

## 2016-06-19 ENCOUNTER — Ambulatory Visit
Admission: RE | Admit: 2016-06-19 | Discharge: 2016-06-19 | Disposition: A | Discharge status: Home / Self Care | Payer: PRIVATE HEALTH INSURANCE | Source: Ambulatory Visit | Attending: Family Medicine | Admitting: Family Medicine

## 2016-06-19 DIAGNOSIS — Z1231 Encounter for screening mammogram for malignant neoplasm of breast: Secondary | ICD-10-CM

## 2016-06-25 ENCOUNTER — Ambulatory Visit (HOSPITAL_BASED_OUTPATIENT_CLINIC_OR_DEPARTMENT_OTHER): Payer: PRIVATE HEALTH INSURANCE | Admitting: Internal Medicine

## 2016-06-25 ENCOUNTER — Encounter: Payer: Self-pay | Admitting: Internal Medicine

## 2016-06-25 ENCOUNTER — Other Ambulatory Visit (HOSPITAL_BASED_OUTPATIENT_CLINIC_OR_DEPARTMENT_OTHER): Payer: PRIVATE HEALTH INSURANCE

## 2016-06-25 ENCOUNTER — Telehealth: Payer: Self-pay | Admitting: Internal Medicine

## 2016-06-25 VITALS — BP 113/75 | HR 113 | Temp 98.4°F | Resp 17 | Ht 65.5 in | Wt 142.1 lb

## 2016-06-25 DIAGNOSIS — C3431 Malignant neoplasm of lower lobe, right bronchus or lung: Secondary | ICD-10-CM

## 2016-06-25 DIAGNOSIS — C342 Malignant neoplasm of middle lobe, bronchus or lung: Secondary | ICD-10-CM | POA: Diagnosis not present

## 2016-06-25 DIAGNOSIS — Z5111 Encounter for antineoplastic chemotherapy: Secondary | ICD-10-CM

## 2016-06-25 DIAGNOSIS — C7931 Secondary malignant neoplasm of brain: Secondary | ICD-10-CM

## 2016-06-25 LAB — COMPREHENSIVE METABOLIC PANEL
ALK PHOS: 51 U/L (ref 40–150)
ANION GAP: 10 meq/L (ref 3–11)
AST: 19 U/L (ref 5–34)
Albumin: 3.6 g/dL (ref 3.5–5.0)
BILIRUBIN TOTAL: 0.52 mg/dL (ref 0.20–1.20)
BUN: 8.8 mg/dL (ref 7.0–26.0)
CALCIUM: 9.3 mg/dL (ref 8.4–10.4)
CO2: 25 mEq/L (ref 22–29)
Chloride: 106 mEq/L (ref 98–109)
Creatinine: 0.8 mg/dL (ref 0.6–1.1)
EGFR: 74 mL/min/{1.73_m2} — ABNORMAL LOW (ref >90)
GLUCOSE: 83 mg/dL (ref 70–140)
Potassium: 3.6 mEq/L (ref 3.5–5.1)
Sodium: 141 mEq/L (ref 136–145)
TOTAL PROTEIN: 7.7 g/dL (ref 6.4–8.3)

## 2016-06-25 LAB — CBC WITH DIFFERENTIAL/PLATELET
BASO%: 0.2 % (ref 0.0–2.0)
Basophils Absolute: 0 10*3/uL (ref 0.0–0.1)
EOS%: 0.2 % (ref 0.0–7.0)
Eosinophils Absolute: 0 10*3/uL (ref 0.0–0.5)
HEMATOCRIT: 35.4 % (ref 34.8–46.6)
HEMOGLOBIN: 10.7 g/dL — AB (ref 11.6–15.9)
LYMPH#: 0.8 10*3/uL — AB (ref 0.9–3.3)
LYMPH%: 15.1 % (ref 14.0–49.7)
MCH: 29.9 pg (ref 25.1–34.0)
MCHC: 30.2 g/dL — ABNORMAL LOW (ref 31.5–36.0)
MCV: 98.9 fL (ref 79.5–101.0)
MONO#: 2.2 10*3/uL — AB (ref 0.1–0.9)
MONO%: 40.6 % — ABNORMAL HIGH (ref 0.0–14.0)
NEUT%: 43.9 % (ref 38.4–76.8)
NEUTROS ABS: 2.4 10*3/uL (ref 1.5–6.5)
RBC: 3.58 10*6/uL — ABNORMAL LOW (ref 3.70–5.45)
RDW: 16.3 % — ABNORMAL HIGH (ref 11.2–14.5)
WBC: 5.4 10*3/uL (ref 3.9–10.3)
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
## 2016-06-25 NOTE — Telephone Encounter (Signed)
Gv pt appt for 07/25/16 and contrast. Advised radiology will call for ct.

## 2016-07-05 ENCOUNTER — Ambulatory Visit (HOSPITAL_COMMUNITY): Payer: PRIVATE HEALTH INSURANCE

## 2016-07-19 ENCOUNTER — Ambulatory Visit (HOSPITAL_COMMUNITY)
Admission: RE | Admit: 2016-07-19 | Discharge: 2016-07-19 | Disposition: A | Discharge status: Home / Self Care | Payer: PRIVATE HEALTH INSURANCE | Source: Ambulatory Visit | Attending: Internal Medicine | Admitting: Internal Medicine

## 2016-07-19 DIAGNOSIS — C7931 Secondary malignant neoplasm of brain: Secondary | ICD-10-CM | POA: Insufficient documentation

## 2016-07-19 DIAGNOSIS — I700 Atherosclerosis of aorta: Secondary | ICD-10-CM | POA: Diagnosis not present

## 2016-07-19 DIAGNOSIS — R911 Solitary pulmonary nodule: Secondary | ICD-10-CM | POA: Insufficient documentation

## 2016-07-19 DIAGNOSIS — K8020 Calculus of gallbladder without cholecystitis without obstruction: Secondary | ICD-10-CM | POA: Diagnosis not present

## 2016-07-19 DIAGNOSIS — Z5111 Encounter for antineoplastic chemotherapy: Secondary | ICD-10-CM | POA: Diagnosis not present

## 2016-07-19 DIAGNOSIS — C342 Malignant neoplasm of middle lobe, bronchus or lung: Secondary | ICD-10-CM | POA: Diagnosis not present

## 2016-07-19 DIAGNOSIS — I2510 Atherosclerotic heart disease of native coronary artery without angina pectoris: Secondary | ICD-10-CM | POA: Insufficient documentation

## 2016-07-19 MED ORDER — IOPAMIDOL (ISOVUE-300) INJECTION 61%
100.0000 mL | Freq: Once | INTRAVENOUS | Status: AC | PRN
  Administered 2016-07-19: 100 mL via INTRAVENOUS

## 2016-07-19 MED FILL — TAGRISSO 80 MG TABLET: 80 | 30 days supply | Qty: 30 | Fill #2

## 2016-07-22 ENCOUNTER — Ambulatory Visit
Admission: RE | Admit: 2016-07-22 | Discharge: 2016-07-22 | Disposition: A | Discharge status: Home / Self Care | Payer: PRIVATE HEALTH INSURANCE | Source: Ambulatory Visit | Attending: Radiation Oncology | Admitting: Radiation Oncology

## 2016-07-22 DIAGNOSIS — C7949 Secondary malignant neoplasm of other parts of nervous system: Principal | ICD-10-CM

## 2016-07-22 DIAGNOSIS — C7931 Secondary malignant neoplasm of brain: Secondary | ICD-10-CM

## 2016-07-22 MED ORDER — GADOBENATE DIMEGLUMINE 529 MG/ML IV SOLN
13.0000 mL | Freq: Once | INTRAVENOUS | Status: AC | PRN
  Administered 2016-07-22: 13 mL via INTRAVENOUS

## 2016-07-25 ENCOUNTER — Encounter: Payer: Self-pay | Admitting: Internal Medicine

## 2016-07-25 ENCOUNTER — Telehealth: Payer: Self-pay | Admitting: Internal Medicine

## 2016-07-25 ENCOUNTER — Other Ambulatory Visit (HOSPITAL_BASED_OUTPATIENT_CLINIC_OR_DEPARTMENT_OTHER): Payer: PRIVATE HEALTH INSURANCE

## 2016-07-25 ENCOUNTER — Ambulatory Visit (HOSPITAL_BASED_OUTPATIENT_CLINIC_OR_DEPARTMENT_OTHER): Payer: PRIVATE HEALTH INSURANCE | Admitting: Internal Medicine

## 2016-07-25 VITALS — BP 135/66 | HR 82 | Temp 97.3°F | Resp 16 | Ht 65.5 in | Wt 139.5 lb

## 2016-07-25 DIAGNOSIS — R21 Rash and other nonspecific skin eruption: Secondary | ICD-10-CM

## 2016-07-25 DIAGNOSIS — D6481 Anemia due to antineoplastic chemotherapy: Secondary | ICD-10-CM

## 2016-07-25 DIAGNOSIS — C7931 Secondary malignant neoplasm of brain: Secondary | ICD-10-CM | POA: Diagnosis not present

## 2016-07-25 DIAGNOSIS — Z5111 Encounter for antineoplastic chemotherapy: Secondary | ICD-10-CM

## 2016-07-25 DIAGNOSIS — C342 Malignant neoplasm of middle lobe, bronchus or lung: Secondary | ICD-10-CM

## 2016-07-25 DIAGNOSIS — T451X5A Adverse effect of antineoplastic and immunosuppressive drugs, initial encounter: Secondary | ICD-10-CM

## 2016-07-25 DIAGNOSIS — C3491 Malignant neoplasm of unspecified part of right bronchus or lung: Secondary | ICD-10-CM

## 2016-07-25 LAB — CBC WITH DIFFERENTIAL/PLATELET
BASO%: 0.4 % (ref 0.0–2.0)
BASOS ABS: 0 10*3/uL (ref 0.0–0.1)
EOS ABS: 0 10*3/uL (ref 0.0–0.5)
EOS%: 0 % (ref 0.0–7.0)
HEMATOCRIT: 33.3 % — AB (ref 34.8–46.6)
HEMOGLOBIN: 10 g/dL — AB (ref 11.6–15.9)
LYMPH#: 0.5 10*3/uL — AB (ref 0.9–3.3)
LYMPH%: 18.9 % (ref 14.0–49.7)
MCH: 28.6 pg (ref 25.1–34.0)
MCHC: 30 g/dL — ABNORMAL LOW (ref 31.5–36.0)
MCV: 95.1 fL (ref 79.5–101.0)
MONO#: 1.3 10*3/uL — AB (ref 0.1–0.9)
MONO%: 46.7 % — ABNORMAL HIGH (ref 0.0–14.0)
NEUT%: 34 % — ABNORMAL LOW (ref 38.4–76.8)
NEUTROS ABS: 0.9 10*3/uL — AB (ref 1.5–6.5)
Platelets: 44 10*3/uL — ABNORMAL LOW (ref 145–400)
RBC: 3.5 10*6/uL — ABNORMAL LOW (ref 3.70–5.45)
RDW: 16.7 % — AB (ref 11.2–14.5)
WBC: 2.7 10*3/uL — AB (ref 3.9–10.3)

## 2016-07-25 LAB — COMPREHENSIVE METABOLIC PANEL
ALBUMIN: 3.7 g/dL (ref 3.5–5.0)
ALK PHOS: 44 U/L (ref 40–150)
ALT: <6 U/L (ref 0–55)
AST: 18 U/L (ref 5–34)
Anion Gap: 7 mEq/L (ref 3–11)
BILIRUBIN TOTAL: 0.55 mg/dL (ref 0.20–1.20)
BUN: 8.7 mg/dL (ref 7.0–26.0)
CALCIUM: 8.9 mg/dL (ref 8.4–10.4)
CO2: 26 mEq/L (ref 22–29)
Chloride: 107 mEq/L (ref 98–109)
Creatinine: 0.8 mg/dL (ref 0.6–1.1)
EGFR: 81 mL/min/{1.73_m2} — AB (ref >90)
GLUCOSE: 82 mg/dL (ref 70–140)
POTASSIUM: 3.8 meq/L (ref 3.5–5.1)
SODIUM: 141 meq/L (ref 136–145)
TOTAL PROTEIN: 6.8 g/dL (ref 6.4–8.3)

## 2016-07-25 LAB — TECHNOLOGIST REVIEW
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
## 2016-07-25 NOTE — Telephone Encounter (Signed)
AVS report and appointment schedule given to patient, per 07/25/16 los.

## 2016-07-26 NOTE — Progress Notes (Signed)
Reconsult  Stage III NSCCA, with brain metastases recurrent lung .   CT of Chest 08/19/2016 " Significant increase in size of left Upper Lobe and Right lower Lobe"  Respiratory Status: No SOB  Ambulatory Status: Steady Gait Pain Status: None Fatigue  Past Radiation 04/11/16: right occipital 14GY 10/20/12-10/27/12 right chest region 50.4gy/28 fx  BP 126/80 (BP Location: Left Arm, Patient Position: Sitting, Cuff Size: Normal)   Pulse 76   Temp 98.2 F (36.8 C) (Oral)   Resp 20   Ht 5' 5.5" (1.664 m)   Wt 140 lb 12.8 oz (63.9 kg)   SpO2 100%   BMI 23.07 kg/m    Wt Readings from Last 3 Encounters:  07/29/16 140 lb 12.8 oz (63.9 kg)  07/25/16 139 lb 8 oz (63.3 kg)  06/25/16 142 lb 1.6 oz (64.5 kg)

## 2016-07-29 ENCOUNTER — Ambulatory Visit
Admission: RE | Admit: 2016-07-29 | Discharge: 2016-07-29 | Disposition: A | Discharge status: Home / Self Care | Payer: PRIVATE HEALTH INSURANCE | Source: Ambulatory Visit | Attending: Radiation Oncology | Admitting: Radiation Oncology

## 2016-07-29 ENCOUNTER — Encounter: Payer: Self-pay | Admitting: Internal Medicine

## 2016-07-29 ENCOUNTER — Encounter: Payer: Self-pay | Admitting: Radiation Oncology

## 2016-07-29 VITALS — BP 126/80 | HR 76 | Temp 98.2°F | Resp 20 | Ht 65.5 in | Wt 140.8 lb

## 2016-07-29 DIAGNOSIS — C7801 Secondary malignant neoplasm of right lung: Secondary | ICD-10-CM | POA: Insufficient documentation

## 2016-07-29 DIAGNOSIS — C3490 Malignant neoplasm of unspecified part of unspecified bronchus or lung: Secondary | ICD-10-CM | POA: Insufficient documentation

## 2016-07-29 DIAGNOSIS — M25511 Pain in right shoulder: Secondary | ICD-10-CM | POA: Diagnosis not present

## 2016-07-29 DIAGNOSIS — C342 Malignant neoplasm of middle lobe, bronchus or lung: Secondary | ICD-10-CM

## 2016-07-29 DIAGNOSIS — M4854XA Collapsed vertebra, not elsewhere classified, thoracic region, initial encounter for fracture: Secondary | ICD-10-CM | POA: Diagnosis not present

## 2016-07-29 DIAGNOSIS — C7802 Secondary malignant neoplasm of left lung: Secondary | ICD-10-CM | POA: Diagnosis not present

## 2016-07-29 DIAGNOSIS — Z510 Encounter for antineoplastic radiation therapy: Secondary | ICD-10-CM | POA: Diagnosis not present

## 2016-07-29 DIAGNOSIS — M545 Low back pain: Secondary | ICD-10-CM | POA: Insufficient documentation

## 2016-07-29 DIAGNOSIS — C7931 Secondary malignant neoplasm of brain: Secondary | ICD-10-CM
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
## 2016-07-30 NOTE — Addendum Note (Signed)
Encounter addended by: Benn Moulder, RN on: 07/30/2016 10:34 AM<BR>    Actions taken: Visit Navigator Flowsheet section accepted

## 2016-07-30 NOTE — Addendum Note (Signed)
Encounter addended by: Hayden Pedro, PA-C on: 07/30/2016 11:11 AM<BR>    Actions taken: Sign clinical note

## 2016-08-01 ENCOUNTER — Telehealth: Payer: Self-pay | Admitting: *Deleted

## 2016-08-01 ENCOUNTER — Other Ambulatory Visit: Payer: Self-pay | Admitting: *Deleted

## 2016-08-01 DIAGNOSIS — C7931 Secondary malignant neoplasm of brain: Secondary | ICD-10-CM

## 2016-08-01 DIAGNOSIS — C7949 Secondary malignant neoplasm of other parts of nervous system: Principal | ICD-10-CM

## 2016-08-01 NOTE — Telephone Encounter (Signed)
1.  Do you need a wheel chair?  NO  2. On oxygen? NO  3. Have you ever had any surgery in the body part being scanned?  YES  4. Have you ever had any surgery on your brain or heart?   NO TO HEART YES TO BRAIN  5. Have you ever had surgery on your eyes or ears?  NO         6. Do you have a pacemaker or defibrillator?  NO  7. Do you have a Neurostimulator? NO  8. Claustrophobic?  NO  9. Any risk for metal in eyes? NO  10. Injury by bullet, buckshot, or shrapnel?  NO  11. Stent?  NO  12. Hx of Cancer? LUNG   13. Kidney or Liver disease?  NO  14. Hx of Lupus, Rheumatoid Arthritis or Scleroderma? NO  15. IV Antibiotics or long-term use of NSAIDS?   NO  16. HX of Hypertension?  NO  17. Diabetes?  NO  18. Allergy to contrast?  NO

## 2016-08-01 NOTE — Telephone Encounter (Signed)
"  I missed a call.  No message was left.  I believe it was [employer].  I need an appointment scheduled with [name].Call transfererd ext [phone].  Reached [facility] as Hayle off today.

## 2016-08-05 ENCOUNTER — Other Ambulatory Visit (HOSPITAL_BASED_OUTPATIENT_CLINIC_OR_DEPARTMENT_OTHER): Payer: PRIVATE HEALTH INSURANCE

## 2016-08-05 DIAGNOSIS — C7931 Secondary malignant neoplasm of brain: Secondary | ICD-10-CM

## 2016-08-05 DIAGNOSIS — C342 Malignant neoplasm of middle lobe, bronchus or lung: Secondary | ICD-10-CM

## 2016-08-05 LAB — CBC WITH DIFFERENTIAL/PLATELET
BASO%: 0.7 % (ref 0.0–2.0)
BASOS ABS: 0 10*3/uL (ref 0.0–0.1)
EOS ABS: 0 10*3/uL (ref 0.0–0.5)
EOS%: 0.1 % (ref 0.0–7.0)
HEMATOCRIT: 32.1 % — AB (ref 34.8–46.6)
HEMOGLOBIN: 10.1 g/dL — AB (ref 11.6–15.9)
LYMPH#: 0.8 10*3/uL — AB (ref 0.9–3.3)
LYMPH%: 14.3 % (ref 14.0–49.7)
MCH: 28.1 pg (ref 25.1–34.0)
MCHC: 31.3 g/dL — AB (ref 31.5–36.0)
MCV: 89.9 fL (ref 79.5–101.0)
MONO#: 1.8 10*3/uL — ABNORMAL HIGH (ref 0.1–0.9)
MONO%: 32 % — AB (ref 0.0–14.0)
NEUT#: 3 10*3/uL (ref 1.5–6.5)
NEUT%: 52.9 % (ref 38.4–76.8)
Platelets: 63 10*3/uL — ABNORMAL LOW (ref 145–400)
RBC: 3.58 10*6/uL — ABNORMAL LOW (ref 3.70–5.45)
RDW: 17.7 % — AB (ref 11.2–14.5)
WBC: 5.7 10*3/uL (ref 3.9–10.3)

## 2016-08-05 LAB — COMPREHENSIVE METABOLIC PANEL
ALBUMIN: 4 g/dL (ref 3.5–5.0)
ALK PHOS: 58 U/L (ref 40–150)
ALT: 10 U/L (ref 0–55)
AST: 25 U/L (ref 5–34)
Anion Gap: 9 mEq/L (ref 3–11)
BUN: 9.7 mg/dL (ref 7.0–26.0)
CALCIUM: 9.3 mg/dL (ref 8.4–10.4)
CHLORIDE: 106 meq/L (ref 98–109)
CO2: 25 mEq/L (ref 22–29)
Creatinine: 0.8 mg/dL (ref 0.6–1.1)
EGFR: 82 mL/min/{1.73_m2} — AB (ref >90)
Glucose: 85 mg/dl (ref 70–140)
POTASSIUM: 4.1 meq/L (ref 3.5–5.1)
Sodium: 141 mEq/L (ref 136–145)
Total Bilirubin: 0.63 mg/dL (ref 0.20–1.20)
Total Protein: 7.2 g/dL (ref 6.4–8.3)

## 2016-08-05 LAB — TECHNOLOGIST REVIEW

## 2016-08-08 ENCOUNTER — Ambulatory Visit
Admission: RE | Admit: 2016-08-08 | Discharge: 2016-08-08 | Disposition: A | Discharge status: Home / Self Care | Payer: PRIVATE HEALTH INSURANCE | Source: Ambulatory Visit | Attending: Radiation Oncology | Admitting: Radiation Oncology

## 2016-08-08 ENCOUNTER — Encounter: Payer: Self-pay | Admitting: Internal Medicine

## 2016-08-08 DIAGNOSIS — Z510 Encounter for antineoplastic radiation therapy: Secondary | ICD-10-CM | POA: Diagnosis not present

## 2016-08-08 DIAGNOSIS — C342 Malignant neoplasm of middle lobe, bronchus or lung: Secondary | ICD-10-CM

## 2016-08-08 NOTE — Progress Notes (Signed)
  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  Identity was confirmed.  All relevant records and images related to the planned course of therapy were reviewed.  The patient freely provided informed written consent to proceed with treatment after reviewing the details related to the planned course of therapy. The consent form was witnessed and verified by the simulation staff.  Then, the patient was set-up in a stable reproducible  supine position for radiation therapy.  A BodyFix immobilization pillow was fabricated for reproducible positioning.  Then I personally applied the abdominal compression paddle to limit respiratory excursion.  4D respiratoy motion management CT images were obtained.  Surface markings were placed.  The CT images were loaded into the planning software.  Then, using Cine, MIP, and standard views, the internal target volume (ITV) and planning target volumes (PTV) were delinieated, and avoidance structures were contoured.  Treatment planning then occurred.  The radiation prescription was entered and confirmed.  A total of two complex treatment devices were fabricated in the form of the BodyFix immobilization pillow and a neck accuform cushion.  I have requested : 3D Simulation  I have requested a DVH of the following structures: Heart, Lungs, Esophagus, Chest Wall, Brachial Plexus, Major Blood Vessels, and targets.  PLAN:  The patient will receive 54 Gy in 3 fractions  directed at the enlarging left upper lobe nodule and right lower lobe nodule. -----------------------------------  Blair Promise, PhD, MD This document serves as a record of services personally performed by Gery Pray, MD. It was created on his behalf by Bethann Humble, a trained medical scribe. The creation of this record is based on the scribe's personal observations and the provider's statements to them. This document has been checked and approved by the attending provider.

## 2016-08-15 DIAGNOSIS — Z510 Encounter for antineoplastic radiation therapy: Secondary | ICD-10-CM | POA: Diagnosis not present

## 2016-08-20 ENCOUNTER — Encounter: Payer: Self-pay | Admitting: Radiation Oncology

## 2016-08-21 NOTE — Telephone Encounter (Signed)
[place] patient. Sam

## 2016-08-26 ENCOUNTER — Telehealth: Payer: Self-pay

## 2016-08-26 ENCOUNTER — Telehealth: Payer: Self-pay | Admitting: Medical Oncology

## 2016-08-26 ENCOUNTER — Encounter: Payer: Self-pay | Admitting: Medical Oncology

## 2016-08-26 NOTE — Telephone Encounter (Signed)
I left message at Dr Cristi Loron that per [facility] it is okay to prescribe steroid for pt.

## 2016-08-26 NOTE — Telephone Encounter (Signed)
Pt stating she has a strain in her leg muscle and her PCP is not comfortable prescribing steroid without Dr Worthy Flank input. Dr [name] phone number is [phone].

## 2016-08-27 ENCOUNTER — Encounter: Payer: Self-pay | Admitting: Radiation Oncology

## 2016-08-27 ENCOUNTER — Telehealth: Payer: Self-pay | Admitting: Internal Medicine

## 2016-08-27 ENCOUNTER — Ambulatory Visit (HOSPITAL_BASED_OUTPATIENT_CLINIC_OR_DEPARTMENT_OTHER): Payer: PRIVATE HEALTH INSURANCE | Admitting: Internal Medicine

## 2016-08-27 ENCOUNTER — Encounter: Payer: Self-pay | Admitting: Internal Medicine

## 2016-08-27 ENCOUNTER — Ambulatory Visit
Admission: RE | Admit: 2016-08-27 | Discharge: 2016-08-27 | Disposition: A | Discharge status: Home / Self Care | Payer: PRIVATE HEALTH INSURANCE | Source: Ambulatory Visit | Attending: Radiation Oncology | Admitting: Radiation Oncology

## 2016-08-27 ENCOUNTER — Other Ambulatory Visit (HOSPITAL_BASED_OUTPATIENT_CLINIC_OR_DEPARTMENT_OTHER): Payer: PRIVATE HEALTH INSURANCE

## 2016-08-27 VITALS — BP 131/86 | HR 97 | Temp 97.9°F | Resp 18 | Wt 140.4 lb

## 2016-08-27 VITALS — BP 129/72 | HR 86 | Temp 97.8°F | Ht 65.5 in | Wt 137.4 lb

## 2016-08-27 DIAGNOSIS — C7931 Secondary malignant neoplasm of brain: Secondary | ICD-10-CM | POA: Diagnosis not present

## 2016-08-27 DIAGNOSIS — C342 Malignant neoplasm of middle lobe, bronchus or lung: Secondary | ICD-10-CM

## 2016-08-27 DIAGNOSIS — Z510 Encounter for antineoplastic radiation therapy: Secondary | ICD-10-CM | POA: Diagnosis not present

## 2016-08-27 LAB — COMPREHENSIVE METABOLIC PANEL
ALT: 10 U/L (ref 0–55)
ANION GAP: 9 meq/L (ref 3–11)
AST: 21 U/L (ref 5–34)
Albumin: 3.9 g/dL (ref 3.5–5.0)
Alkaline Phosphatase: 65 U/L (ref 40–150)
BUN: 16.2 mg/dL (ref 7.0–26.0)
CALCIUM: 10.3 mg/dL (ref 8.4–10.4)
CHLORIDE: 101 meq/L (ref 98–109)
CO2: 28 meq/L (ref 22–29)
CREATININE: 0.8 mg/dL (ref 0.6–1.1)
EGFR: 81 mL/min/{1.73_m2} — ABNORMAL LOW (ref >90)
Glucose: 128 mg/dl (ref 70–140)
POTASSIUM: 3.9 meq/L (ref 3.5–5.1)
Sodium: 138 mEq/L (ref 136–145)
Total Bilirubin: 0.58 mg/dL (ref 0.20–1.20)
Total Protein: 6.9 g/dL (ref 6.4–8.3)

## 2016-08-27 LAB — CBC WITH DIFFERENTIAL/PLATELET
BASO%: 0.1 % (ref 0.0–2.0)
Basophils Absolute: 0 10*3/uL (ref 0.0–0.1)
EOS ABS: 0 10*3/uL (ref 0.0–0.5)
EOS%: 0.1 % (ref 0.0–7.0)
HCT: 35.6 % (ref 34.8–46.6)
HGB: 10.9 g/dL — ABNORMAL LOW (ref 11.6–15.9)
LYMPH%: 3.3 % — AB (ref 14.0–49.7)
MCH: 27.4 pg (ref 25.1–34.0)
MCHC: 30.7 g/dL — AB (ref 31.5–36.0)
MCV: 89.2 fL (ref 79.5–101.0)
MONO#: 2.2 10*3/uL — ABNORMAL HIGH (ref 0.1–0.9)
MONO%: 13 % (ref 0.0–14.0)
NEUT%: 83.5 % — AB (ref 38.4–76.8)
NEUTROS ABS: 14.3 10*3/uL — AB (ref 1.5–6.5)
PLATELETS: 70 10*3/uL — AB (ref 145–400)
RBC: 3.99 10*6/uL (ref 3.70–5.45)
RDW: 19.2 % — ABNORMAL HIGH (ref 11.2–14.5)
WBC: 17.1 10*3/uL — AB (ref 3.9–10.3)
lymph#: 0.6 10*3/uL — ABNORMAL LOW (ref 0.9–3.3)
nRBC: 1 % — ABNORMAL HIGH (ref 0–0)

## 2016-08-27 LAB — TECHNOLOGIST REVIEW

## 2016-08-27 NOTE — Telephone Encounter (Signed)
Gave patient avs report and appointments for December  °
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
## 2016-08-27 NOTE — Progress Notes (Signed)
Tracy Dodson has completed 1 fraction to her left and right lung.  She reports "spraining" her right side last week and has been taking Norco q 4 hours and was started on a prednisone taper by her PCP.  She denies having shortness of breath or a cough.  She denies having fatigue.  BP 129/72 (BP Location: Right Arm, Patient Position: Sitting)   Pulse 86   Temp 97.8 F (36.6 C) (Oral)   Ht 5' 5.5" (1.664 m)   Wt 137 lb 6.4 oz (62.3 kg)   SpO2 99%   BMI 22.52 kg/m   Wt Readings from Last 3 Encounters:  08/27/16 137 lb 6.4 oz (62.3 kg)  08/27/16 140 lb 6.4 oz (63.7 kg)  07/29/16 140 lb 12.8 oz (63.9 kg)

## 2016-08-27 NOTE — Progress Notes (Signed)
  Radiation Oncology         [phone] ________________________________  Name: Tracy Dodson MRN: [id number]  Date: 08/27/2016  DOB: [birth year]  Stereotactic Body Radiotherapy Treatment Procedure Note  NARRATIVE:  Tracy Dodson was brought to the stereotactic radiation treatment machine and placed supine on the CT couch. The patient was set up for stereotactic body radiotherapy on the body fix pillow.  3D TREATMENT PLANNING AND DOSIMETRY:  The patient's radiation plan was reviewed and approved prior to starting treatment.  It showed 3-dimensional radiation distributions overlaid onto the planning CT.  The [facility] for the target structures as well as the organs at risk were reviewed. The documentation of this is filed in the radiation oncology EMR.  SIMULATION VERIFICATION:  The patient underwent CT imaging on the treatment unit.  These were carefully aligned to document that the ablative radiation dose would cover the target volume and maximally spare the nearby organs at risk according to the planned distribution.  SPECIAL TREATMENT PROCEDURE: Tracy Dodson received high dose ablative stereotactic body radiotherapy to the planned target volume without unforeseen complications. Treatment was delivered uneventfully. The high doses associated with stereotactic body radiotherapy and the significant potential risks require careful treatment set up and patient monitoring constituting a special treatment procedure   STEREOTACTIC TREATMENT MANAGEMENT:  Following delivery, the patient was evaluated clinically. The patient tolerated treatment without significant acute effects, and was discharged to home in stable condition.    PLAN: Continue treatment as planned.  ________________________________  Blair Promise, PhD, MD

## 2016-08-27 NOTE — Progress Notes (Signed)
  Radiation Oncology         [phone] ________________________________  Name: Tracy Dodson MRN: [id number]  Date: 08/27/2016  DOB: [birth year]  Weekly Radiation Therapy Management    ICD-9-CM ICD-10-CM   1. Primary cancer of right middle lobe of lung (HCC) 162.4 C34.2      Current Dose: 10 Gy     Planned Dose:  50 Gy  Narrative . . . . . . . . The patient presents for routine under treatment assessment.  The patient has completed 1 fraction to her left and right lung. She reports "spraining" her right side last week and has been taking Norco q4h, as well as being started on a prednisone taper by her PCP. She denies SOB or cough. She denies fatigue.                                   The patient is without complaint.                                 Set-up films were reviewed.                                 The chart was checked. Physical Findings. . .  height is 5' 5.5" (1.664 m) and weight is 137 lb 6.4 oz (62.3 kg). Her oral temperature is 97.8 F (36.6 C). Her blood pressure is 129/72 and her pulse is 86. Her oxygen saturation is 99%. . Weight essentially stable.  No significant changes. Cardiopulmonary assessment is negative for acute distress and she exhibits normal effort.  Impression . . . . . . . The patient is tolerating radiation. She presents in a wheelchair today. Plan . . . . . . . . . . . . Continue treatment as planned.  ________________________________   Blair Promise, PhD, MD  This document serves as a record of services personally performed by Gery Pray, MD. It was created on his behalf by Maryla Morrow, a trained medical scribe. The creation of this record is based on the scribe's personal observations and the provider's statements to them. This document has been checked and approved by the attending provider.

## 2016-08-28 ENCOUNTER — Ambulatory Visit: Payer: PRIVATE HEALTH INSURANCE | Admitting: Radiation Oncology

## 2016-08-29 ENCOUNTER — Ambulatory Visit
Admission: RE | Admit: 2016-08-29 | Discharge: 2016-08-29 | Disposition: A | Discharge status: Home / Self Care | Payer: PRIVATE HEALTH INSURANCE | Source: Ambulatory Visit | Attending: Radiation Oncology | Admitting: Radiation Oncology

## 2016-08-29 DIAGNOSIS — C342 Malignant neoplasm of middle lobe, bronchus or lung: Secondary | ICD-10-CM

## 2016-08-29 DIAGNOSIS — Z510 Encounter for antineoplastic radiation therapy: Secondary | ICD-10-CM | POA: Diagnosis not present

## 2016-08-29 NOTE — Progress Notes (Signed)
  Radiation Oncology         [phone] ________________________________  Name: Tracy Dodson MRN: [id number]  Date: 08/29/2016  DOB: 1951-01-05  Stereotactic Body Radiotherapy Treatment Procedure Note  NARRATIVE:  Tracy Dodson was brought to the stereotactic radiation treatment machine and placed supine on the CT couch. The patient was set up for stereotactic body radiotherapy on the body fix pillow.  3D TREATMENT PLANNING AND DOSIMETRY:  The patient's radiation plan was reviewed and approved prior to starting treatment.  It showed 3-dimensional radiation distributions overlaid onto the planning CT.  The [facility] for the target structures as well as the organs at risk were reviewed. The documentation of this is filed in the radiation oncology EMR.  SIMULATION VERIFICATION:  The patient underwent CT imaging on the treatment unit.  These were carefully aligned to document that the ablative radiation dose would cover the target volume and maximally spare the nearby organs at risk according to the planned distribution.  SPECIAL TREATMENT PROCEDURE: Tracy Dodson received high dose ablative stereotactic body radiotherapy to the planned target volume without unforeseen complications. Treatment was delivered uneventfully. The high doses associated with stereotactic body radiotherapy and the significant potential risks require careful treatment set up and patient monitoring constituting a special treatment procedure   STEREOTACTIC TREATMENT MANAGEMENT:  Following delivery, the patient was evaluated clinically. The patient tolerated treatment without significant acute effects, and was discharged to home in stable condition.    PLAN: Continue treatment as planned.  ________________________________  Blair Promise, PhD, MD  This document serves as a record of services personally performed by Gery Pray, MD. It was created on his behalf by Bethann Humble, a trained medical scribe. The creation of  this record is based on the scribe's personal observations and the provider's statements to them. This document has been checked and approved by the attending provider.

## 2016-08-30 ENCOUNTER — Other Ambulatory Visit: Payer: Self-pay | Admitting: Family Medicine

## 2016-08-30 ENCOUNTER — Telehealth: Payer: Self-pay | Admitting: Oncology

## 2016-08-30 ENCOUNTER — Encounter: Payer: Self-pay | Admitting: *Deleted

## 2016-08-30 ENCOUNTER — Ambulatory Visit
Admission: RE | Admit: 2016-08-30 | Discharge: 2016-08-30 | Disposition: A | Discharge status: Home / Self Care | Payer: PRIVATE HEALTH INSURANCE | Source: Ambulatory Visit | Attending: Family Medicine | Admitting: Family Medicine

## 2016-08-30 ENCOUNTER — Telehealth: Payer: Self-pay | Admitting: Medical Oncology

## 2016-08-30 ENCOUNTER — Other Ambulatory Visit: Payer: Self-pay | Admitting: Medical Oncology

## 2016-08-30 ENCOUNTER — Ambulatory Visit: Payer: PRIVATE HEALTH INSURANCE | Admitting: Radiation Oncology

## 2016-08-30 DIAGNOSIS — M549 Dorsalgia, unspecified: Secondary | ICD-10-CM

## 2016-08-30 DIAGNOSIS — M546 Pain in thoracic spine: Secondary | ICD-10-CM

## 2016-08-30 MED ORDER — HYDROCODONE-ACETAMINOPHEN 5-325 MG PO TABS: 2.0000 | ORAL_TABLET | ORAL | 0 refills | Status: DC | PRN

## 2016-08-30 NOTE — Telephone Encounter (Addendum)
Per Selena Lesser I told pt to take hydrocodone 5/325 mg 2 tabs every 3-4 hours and include stool softener.Pt states she has 31 left so [employer] wrote rx and pt husband will pick it up before 430 pm . I also told her to call back if this is not effective.

## 2016-08-30 NOTE — Telephone Encounter (Signed)
Called Tracy Dodson back and notified her that an appointment has been made to see [name] on Monday at 9:00 am to address radiation to her spine.  Also advised her that Norton Blizzard, Nurse Navigator will be contacting Dr. Julien Nordmann about her uncontrolled back pain and will be calling her.  [name] verbalized understanding and agreement.

## 2016-08-30 NOTE — Telephone Encounter (Signed)
Returned call. Pt is having  mid back pain on Tuesday and it is getting worse-she has to lay flat on back. She is in a lot of pain . She saw Dr Harrington Challenger today for same and had CT scan showing "compound fracture".She said Dr Harrington Challenger is trying to get in touch with [facility].  I called Dr Alan Ripper office and gave staff Dr Gearldine Shown  pager.

## 2016-08-30 NOTE — Progress Notes (Signed)
Oncology Nurse Navigator Documentation  Oncology Nurse Navigator Flowsheets 08/30/2016  Navigator Location [facility]  Navigator Encounter Type Other/Dr. Benay Spice called about patient needing to be set up with Rad Onc.  I spoke with Dr. Clabe Seal nurse and she stated patient could be seen Monday.  They will coordinate with patient.  I notified Dr. Julien Nordmann of pain issues.    Treatment Phase Treatment  Barriers/Navigation Needs Coordination of Care  Interventions Coordination of Care  Coordination of Care Other  Acuity Level 2  Acuity Level 2 Other  Time Spent with Patient 30

## 2016-08-30 NOTE — Telephone Encounter (Signed)
Tracy Dodson called and said she had an xray and CT scan of her spine today due to pain that started on Tuesday.  The scan showed "a compression deformity of T10 vertebral body concerning for acute or subacute fracture." She said her pain is a 10/10.  She is taking hydrocodone/acetaminophen left over from her brain surgery which she says is barely touching the pain.  She denies having any numbness or weakness in her legs or any bowel/bladder incontinence/retention.  She wants to make [name] and Dr. Julien Nordmann aware of the results as she is not sure if she needs to go to the ER or not.  Notified Dr. Isidore Moos of the results and she recommended calling Selena Lesser, NP to see if patient needs work up in the clinic or to go to the ER.  Cyndee recommended pain control with medication over the weekend and then referral to an orthopedic surgeon on Monday.  Also talked to Abelina Bachelor, RN who is contacting Dr. Benay Spice who is on-call for direction.

## 2016-08-30 NOTE — Telephone Encounter (Addendum)
She is taking hydrocodone 5/325 every 4 hours prn she received after surgery in July. She has been taking this without relief. She is not supposed to take antiinflammatory. She is laying flat today.

## 2016-08-30 NOTE — Telephone Encounter (Signed)
I notified Dr. Julien Nordmann of pain issues.

## 2016-09-02 ENCOUNTER — Ambulatory Visit
Admission: RE | Admit: 2016-09-02 | Discharge: 2016-09-02 | Disposition: A | Discharge status: Home / Self Care | Payer: PRIVATE HEALTH INSURANCE | Source: Ambulatory Visit | Attending: Radiation Oncology | Admitting: Radiation Oncology

## 2016-09-02 ENCOUNTER — Telehealth: Payer: Self-pay | Admitting: Oncology

## 2016-09-02 ENCOUNTER — Encounter: Payer: Self-pay | Admitting: Radiation Oncology

## 2016-09-02 ENCOUNTER — Ambulatory Visit: Payer: PRIVATE HEALTH INSURANCE

## 2016-09-02 VITALS — BP 108/66 | HR 90 | Temp 97.7°F | Ht 65.5 in | Wt 142.2 lb

## 2016-09-02 VITALS — BP 115/84 | HR 104

## 2016-09-02 DIAGNOSIS — Z510 Encounter for antineoplastic radiation therapy: Secondary | ICD-10-CM | POA: Diagnosis not present

## 2016-09-02 DIAGNOSIS — C342 Malignant neoplasm of middle lobe, bronchus or lung: Secondary | ICD-10-CM

## 2016-09-02 MED ORDER — HYDROMORPHONE HCL 1 MG/ML IJ SOLN
0.5000 mg | Freq: Once | INTRAMUSCULAR | Status: AC
  Administered 2016-09-02: 0.5 mg via INTRAMUSCULAR
  Filled 2016-09-02: qty 1

## 2016-09-02 MED ORDER — OXYCODONE HCL ER 20 MG PO T12A: 20.0000 mg | EXTENDED_RELEASE_TABLET | Freq: Two times a day (BID) | ORAL | 0 refills | Status: DC

## 2016-09-02 NOTE — Progress Notes (Signed)
Radiation Oncology         (336) [phone] ________________________________  Name: Tracy Dodson MRN: [id number]  Date: 09/02/2016  DOB: [birth year]  Re-Evaluation Visit Note  CC: [name], MD  Curt Bears, MD    ICD-9-CM ICD-10-CM   1. 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 ExacTrac Snap verification was performed for each couch angle.  10/20/2012 through 11/27/2012: Right chest region 50.4 Gy in 28 fractions  Narrative:  The patient returns today for a re-evaluation. The patient reported acute mid-back pain in the past week. The patient had an x-ray of the thoracic spine on 08/30/16 and this showed a lower thoracic compression fracture likely at the T10 level with moderate hight loss. CT of the thoracic spine the same day revealed a moderate compression deformity of the T10 vertebral body concerning for acute or subacute fracture. Metastatic disease could not be excluded, but no significant lytic destruction was seen at the time. MRI of the spine was recommended for further evaluation. The patient is currently undergoing SBRT to the left upper lobe and right lower lobe.  On review of systems: The patient reports pain as a 10/10 to the lower back, right hip, and right shoulder. She is taking two tablets of hydrocodone/acetaminophen 5-325 mg every 3 hours which is only helping a little bit. Selena Lesser, NP has  refilled this medication on 08/30/16. She is taking Sennakot for constipation. She has numbness in her right leg if she twists it or stands a certain way. She is currently on a Prednisone taper (she took 30 mg today and will start 20 mg daily for 7 days tomorrow).  ALLERGIES:  is allergic to compazine [prochlorperazine edisylate] and contrast media [iodinated diagnostic agents].  Meds: Current Outpatient Prescriptions  Medication Sig Dispense Refill  . acetaminophen (TYLENOL) 325 MG tablet Take 325 mg by mouth every 6 (six) hours as needed for moderate pain or headache.     . b complex vitamins tablet Take 1 tablet by mouth daily. Reported on 04/08/2016    . BIOTIN 5000 PO Take 10,000 mcg by mouth daily.     . Calcium Carbonate-Vitamin D (CALCIUM 600+D) 600-200 MG-UNIT TABS Take 1 tablet by mouth daily.    . cholecalciferol (VITAMIN D) 1000 UNITS tablet Take 2,000 Units by mouth daily.     . citalopram (CELEXA) 20 MG tablet TK 1 T PO QD  4  . HYDROcodone-acetaminophen (NORCO/VICODIN) 5-325 MG tablet Take 2 tablets by mouth every 3 (three) hours as needed for moderate pain (may take 2 tablets every 3-4 hours prn for back pain). 60 tablet 0  . LORazepam (ATIVAN) 0.5 MG tablet Take one tab po 30 minutes prior to MRI or radiation treatment. 30 tablet 0  . Melatonin 5 MG TABS Take 5 mg by mouth at bedtime as needed. Reported on 04/08/2016    . Milk Thistle 1000 MG CAPS Take 1  capsule by mouth daily.    . Multiple Vitamins-Minerals (CENTRUM SILVER PO) Take 1 capsule by mouth daily.    . Omega-3 Fatty Acids (FISH OIL) 1000 MG CAPS Take 1 capsule by mouth daily. Reported on 04/08/2016    . osimertinib mesylate (TAGRISSO) 80 MG tablet Take 1 tablet (80 mg total) by mouth daily. 30 tablet 2  . OVER THE COUNTER MEDICATION Take 2 tablets by mouth at bedtime.    . pantoprazole (PROTONIX) 40 MG tablet Take 1 tablet (40 mg total) by mouth daily. Switch for any other PPI at similar dose and frequency 30 tablet 0    . predniSONE (DELTASONE) 10 MG tablet   0  . PREVIDENT 5000 DRY MOUTH 1.1 % GEL dental gel See admin instructions.  0  . Saccharomyces boulardii (FLORASTOR PO) Take 1 capsule by mouth daily. Reported on 04/08/2016    . oxyCODONE (OXYCONTIN) 20 mg 12 hr tablet Take 1 tablet (20 mg total) by mouth every 12 (twelve) hours. 30 tablet 0  . predniSONE (DELTASONE) 5 MG tablet Take 5 mg by mouth daily with breakfast. Taking 3 tabs(65m) in the AM and 2 tabs(191m in the PM     No current facility-administered medications for this encounter.     Physical Findings: The patient is in no acute distress. Patient is alert and oriented.  height is 5' 5.5" (1.664 m) and weight is 142 lb 3.2 oz (64.5 kg). Her oral temperature is 97.7 F (36.5 C). Her blood pressure is 108/66 and her pulse is 90. Her oxygen saturation is 100%.   The patient presents a in a wheelchair. The patient localized pain to the mid-lower back and right hip on physical exam. Also has some point tenderness with palpation along the right scapular area  Lab Findings: Lab Results  Component Value Date   WBC 17.1 (H) 08/27/2016   HGB 10.9 (L) 08/27/2016   HCT 35.6 08/27/2016   MCV 89.2 08/27/2016   PLT 70 (L) 08/27/2016    Radiographic Findings: Dg Thoracic Spine W/swimmers  Result Date: 08/30/2016 CLINICAL DATA:  Fall backwards onto radiation therapy table 3 days ago. Initial encounter EXAM: THORACIC SPINE - 3 VIEWS COMPARISON:  Body CT 07/19/2016 FINDINGS: Lower thoracic compression fracture with moderate height loss, new from 07/19/2016. The fracture is likely at the T10 level. Bilateral pulmonary nodules, known. Exaggerated thoracic kyphosis and generalized disc narrowing. IMPRESSION: Lower thoracic (likely T10) moderate compression fracture that is acute or subacute based development since 07/19/2016 CT. Electronically Signed   By: JoMonte Fantasia.D.   On: 08/30/2016 09:50   Ct Thoracic Spine Wo Contrast  Result Date:  08/30/2016 CLINICAL DATA:  Acute mid back pain.  History of lung cancer. EXAM: CT THORACIC SPINE WITHOUT CONTRAST TECHNIQUE: Multidetector CT images of the thoracic were obtained using the standard protocol without intravenous contrast. COMPARISON:  CT scan of July 19, 2016.  Radiographs of same day. FINDINGS: Alignment: No spondylolisthesis is noted. Vertebrae: There is interval development of moderate compression deformity of T10 vertebral body concerning for acute or subacute fracture. There is no retropulsion of fragments into spinal canal. Paraspinal and other soft tissues: Irregular left upper lobe mass is significantly increased in size, measuring 2.4 x 2.1 cm currently. Stable 3.0 x 2.5 cm right lower lobe mass is noted. No significant central spinal canal stenosis is noted. Disc levels: Multilevel degenerative disc disease is noted in the lower thoracic spine. IMPRESSION: Left upper lobe irregular mass is increased in size compared  to prior exam, concerning for worsening malignancy. Stable right lower lobe mass is noted also concerning for malignancy or metastatic disease. Interval development of moderate compression deformity of T10 vertebral body concerning for acute or subacute fracture. Given the history of malignancy, metastatic disease cannot be excluded, although no significant lytic destruction is seen at this time. MRI may be performed for further evaluation. These results will be called to the ordering clinician or representative by the Radiologist Assistant, and communication documented in the PACS or zVision Dashboard. Electronically Signed   By: Marijo Conception, M.D.   On: 08/30/2016 13:11    Impression: Recurrent non-small cell lung cancer initially diagnosed as stage IIIA (T1b N2M0) suspicious for adenocarcinoma with EGFR mutation in exon 19 and negative ALK gene translocation,  now with oligo metastasis involving the left upper lobe and right lower lobe.  The patient now presents  with a possible pathologic vs benign fracture of the T10 vertebral body on recent x-ray and CT scans. In addition pain in the right pelvis and right shoulder region  Plan: I will order a bone scan to evaluate pain in her right shoulder and right pelvis. I will order an MRI of the thoracic spine to determine if she has a pathologic or benign compression fracture. If there is osseous metastatic disease, then palliative radiation could be a treatment option for her pain. She might also be a candidate for a vertebroplasty. I will prescribe Oxycotin for her continued pain. Since she continues to have SBRT to her lungs and lying on the treatment table may her extreme pain, we will give Dilaudid prior to her treatment today. She could use her Norco for breakthrough pain. Furthermore, I will fill out a disability form that the patient has provided for me. ____________________________________ -----------------------------------  Blair Promise, PhD, MD  This document serves as a record of services personally performed by Gery Pray, MD. It was created on his behalf by Darcus Austin, a trained medical scribe. The creation of this record is based on the scribe's personal observations and the provider's statements to them. This document has been checked and approved by the attending provider.

## 2016-09-02 NOTE — Telephone Encounter (Signed)
Received message from Judson Roch at [facility] asking for clarification on oxycodone and also if patient could be given 10 mg tablets as they are out of the 20 mg tablets.

## 2016-09-02 NOTE — Progress Notes (Signed)
  Radiation Oncology         (336) [phone] ________________________________  Name: Tracy Dodson MRN: [id number]  Date: 09/02/2016  DOB: 05/15/1951  Stereotactic Body Radiotherapy Treatment Procedure Note  NARRATIVE:  Tracy Dodson was brought to the stereotactic radiation treatment machine and placed supine on the CT couch. The patient was set up for stereotactic body radiotherapy on the body fix pillow.  3D TREATMENT PLANNING AND DOSIMETRY:  The patient's radiation plan was reviewed and approved prior to starting treatment.  It showed 3-dimensional radiation distributions overlaid onto the planning CT.  The [facility] for the target structures as well as the organs at risk were reviewed. The documentation of this is filed in the radiation oncology EMR.  SIMULATION VERIFICATION:  The patient underwent CT imaging on the treatment unit.  These were carefully aligned to document that the ablative radiation dose would cover the target volume and maximally spare the nearby organs at risk according to the planned distribution.  SPECIAL TREATMENT PROCEDURE: Tracy Dodson received high dose ablative stereotactic body radiotherapy to the planned target volume without unforeseen complications. Treatment was delivered uneventfully. The high doses associated with stereotactic body radiotherapy and the significant potential risks require careful treatment set up and patient monitoring constituting a special treatment procedure   STEREOTACTIC TREATMENT MANAGEMENT:  Following delivery, the patient was evaluated clinically. The patient tolerated treatment without significant acute effects, and was discharged to home in stable condition.    PLAN: Continue treatment as planned.  ________________________________  Blair Promise, PhD, MD

## 2016-09-02 NOTE — Progress Notes (Signed)
Histology and Location of Primary Cancer:  Recurrent Stage IIIa, T1b, N2, M0  non-small cell lung cancer, adenocarcinoma of the lung, with brain mets  Location(s) of Symptomatic Metastases: CT from 08/30/16 showed "Interval development of moderate compression deformity of T10 vertebral body concerning for acute or subacute fracture."   Past/Anticipated chemotherapy by medical oncology, if any: no  Pain on a scale of 0-10 is: 10/10 in her mid back.  Also reports pain in her right hip and right shoulder.  Currently taking 2 tablets of norco every 3 hours which is helping a little.  If Spine Met(s), symptoms, if any, include:  Bowel/Bladder retention or incontinence (please describe): no - reports having constipation.  Taking Sennakot.  Numbness or weakness in extremities (please describe): yes in her right leg if she twists or stands up wrong.  Current Decadron regimen, if applicable: currently on a prednisone taper.  Took 30 mg today and will start 20 mg daily for seven days tomorrow.  Ambulatory status? Walker? Wheelchair?: Wheelchair  SAFETY ISSUES:  Prior radiation? Currently being treated with SBRT to both lungs, 10/20/12-11/27/12 Right chest 50.4 Gy in 28 fx's  Pacemaker/ICD? no  Possible current pregnancy? no  Is the patient on methotrexate? no  Current Complaints / other details:    BP 108/66 (BP Location: Left Arm, Patient Position: Sitting)   Pulse 90   Temp 97.7 F (36.5 C) (Oral)   Ht 5' 5.5" (1.664 m)   Wt 142 lb 3.2 oz (64.5 kg)   SpO2 100%   BMI 23.30 kg/m    Wt Readings from Last 3 Encounters:  09/02/16 142 lb 3.2 oz (64.5 kg)  08/27/16 137 lb 6.4 oz (62.3 kg)  08/27/16 140 lb 6.4 oz (63.7 kg)

## 2016-09-03 ENCOUNTER — Encounter: Payer: Self-pay | Admitting: Radiation Oncology

## 2016-09-03 ENCOUNTER — Telehealth: Payer: Self-pay | Admitting: Oncology

## 2016-09-03 ENCOUNTER — Other Ambulatory Visit: Payer: Self-pay | Admitting: Internal Medicine

## 2016-09-03 DIAGNOSIS — C7931 Secondary malignant neoplasm of brain: Secondary | ICD-10-CM

## 2016-09-03 DIAGNOSIS — C342 Malignant neoplasm of middle lobe, bronchus or lung: Secondary | ICD-10-CM

## 2016-09-03 MED ORDER — HYDROMORPHONE HCL 1 MG/ML IJ SOLN
0.5000 mg | Freq: Once | INTRAMUSCULAR | Status: AC
  Administered 2016-09-04: 0.5 mg via INTRAMUSCULAR
  Filled 2016-09-03: qty 1

## 2016-09-03 MED FILL — TAGRISSO 80 MG TABLET: 80 | 30 days supply | Qty: 30 | Fill #0

## 2016-09-03 NOTE — Telephone Encounter (Signed)
Called Tracy Dodson regarding her pain medication.  She said the OxyContin is not helping yet.  She said she had to take 2 of her norco tablets at 7:15 and was getting ready to take more at 10:45.  Advised her to try to space her norco out more and take one tablet at a time.  Advised her to call if she is feeling dizzy or short of breath.

## 2016-09-03 NOTE — Telephone Encounter (Signed)
[place] patient.

## 2016-09-03 NOTE — Telephone Encounter (Signed)
[name] patient.

## 2016-09-03 NOTE — Telephone Encounter (Signed)
Called Tracy Dodson and asked if the change in pain medication was helping.  She said it was helping but is worried about getting on and off the table tomorrow for treatment.  Advised her that [name] has order the dilaudid IM injection but does not want her to take the hydrocodone tomorrow.  Also, [name] said it was OK to take the Oxycontin before she comes in.  [name] verbalized understanding and said that she will not take the hydrocodone tomorrow morning.

## 2016-09-04 ENCOUNTER — Ambulatory Visit
Admission: RE | Admit: 2016-09-04 | Discharge: 2016-09-04 | Disposition: A | Discharge status: Home / Self Care | Payer: PRIVATE HEALTH INSURANCE | Source: Ambulatory Visit | Attending: Radiation Oncology | Admitting: Radiation Oncology

## 2016-09-04 ENCOUNTER — Telehealth: Payer: Self-pay | Admitting: *Deleted

## 2016-09-04 ENCOUNTER — Encounter: Payer: Self-pay | Admitting: Radiation Oncology

## 2016-09-04 VITALS — BP 121/74 | HR 101 | Temp 98.5°F

## 2016-09-04 DIAGNOSIS — Z510 Encounter for antineoplastic radiation therapy: Secondary | ICD-10-CM | POA: Diagnosis not present

## 2016-09-04 DIAGNOSIS — C342 Malignant neoplasm of middle lobe, bronchus or lung: Secondary | ICD-10-CM

## 2016-09-04 NOTE — Telephone Encounter (Signed)
Called patient to inform of MRI for 09-08-16- arrival time - 12:45 pm , no restrictions for MRI and her bone scan on 09-12-16 - arrival time - 8:45 am @ [facility], no restrictions to test, spoke with patient and she is aware of these tests

## 2016-09-04 NOTE — Progress Notes (Signed)
Patient reports the pain in her back and hip is much better.  Will continue to monitor.

## 2016-09-05 ENCOUNTER — Telehealth: Payer: Self-pay | Admitting: Oncology

## 2016-09-05 ENCOUNTER — Other Ambulatory Visit: Payer: Self-pay | Admitting: Radiation Oncology

## 2016-09-05 ENCOUNTER — Encounter: Payer: Self-pay | Admitting: Radiation Oncology

## 2016-09-05 DIAGNOSIS — C342 Malignant neoplasm of middle lobe, bronchus or lung: Secondary | ICD-10-CM

## 2016-09-05 MED ORDER — HYDROMORPHONE HCL 1 MG/ML IJ SOLN
0.5000 mg | Freq: Once | INTRAMUSCULAR | Status: DC
  Filled 2016-09-05: qty 1

## 2016-09-05 NOTE — Telephone Encounter (Signed)
Called Tracy Dodson and let her know that [name] has written a prescription for a wheel chair.  Also advised her that [name] has ordered a dilaudid injection tomorrow before her treatment and not to take her hydrocodone tomorrow but she can take the OxyContin.  Tracy Dodson verbalized understanding and agreement.

## 2016-09-06 ENCOUNTER — Ambulatory Visit
Admission: RE | Admit: 2016-09-06 | Discharge: 2016-09-06 | Disposition: A | Discharge status: Home / Self Care | Payer: PRIVATE HEALTH INSURANCE | Source: Ambulatory Visit | Attending: Radiation Oncology | Admitting: Radiation Oncology

## 2016-09-06 ENCOUNTER — Encounter: Payer: Self-pay | Admitting: Radiation Oncology

## 2016-09-06 VITALS — BP 132/83 | HR 98 | Temp 98.0°F

## 2016-09-06 DIAGNOSIS — C342 Malignant neoplasm of middle lobe, bronchus or lung: Secondary | ICD-10-CM

## 2016-09-06 DIAGNOSIS — Z510 Encounter for antineoplastic radiation therapy: Secondary | ICD-10-CM | POA: Diagnosis not present

## 2016-09-06 MED ORDER — HYDROMORPHONE HCL 1 MG/ML IJ SOLN
0.5000 mg | Freq: Once | INTRAMUSCULAR | Status: AC
  Administered 2016-09-06: 0.5 mg via INTRAMUSCULAR
  Filled 2016-09-06: qty 1

## 2016-09-06 NOTE — Progress Notes (Signed)
Patient reports having pain at an 8/10 in her mid back.  Patient given 0.5 mg dilaudid IM per [name].  Will continue to monitor.

## 2016-09-06 NOTE — Progress Notes (Signed)
Patient reports pain is now a 5/10.

## 2016-09-07 ENCOUNTER — Encounter: Payer: Self-pay | Admitting: Radiation Oncology

## 2016-09-08 ENCOUNTER — Ambulatory Visit (HOSPITAL_COMMUNITY)
Admission: RE | Admit: 2016-09-08 | Discharge: 2016-09-08 | Disposition: A | Discharge status: Home / Self Care | Payer: PRIVATE HEALTH INSURANCE | Source: Ambulatory Visit | Attending: Radiation Oncology | Admitting: Radiation Oncology

## 2016-09-08 DIAGNOSIS — C342 Malignant neoplasm of middle lobe, bronchus or lung: Secondary | ICD-10-CM | POA: Insufficient documentation

## 2016-09-08 DIAGNOSIS — M4854XA Collapsed vertebra, not elsewhere classified, thoracic region, initial encounter for fracture: Secondary | ICD-10-CM | POA: Insufficient documentation

## 2016-09-08 DIAGNOSIS — M40294 Other kyphosis, thoracic region: Secondary | ICD-10-CM | POA: Insufficient documentation

## 2016-09-08 DIAGNOSIS — M4684 Other specified inflammatory spondylopathies, thoracic region: Secondary | ICD-10-CM | POA: Insufficient documentation

## 2016-09-08 DIAGNOSIS — R918 Other nonspecific abnormal finding of lung field: Secondary | ICD-10-CM | POA: Diagnosis not present

## 2016-09-08 DIAGNOSIS — M4804 Spinal stenosis, thoracic region: Secondary | ICD-10-CM | POA: Insufficient documentation

## 2016-09-08 MED ORDER — GADOBENATE DIMEGLUMINE 529 MG/ML IV SOLN
14.0000 mL | Freq: Once | INTRAVENOUS | Status: AC | PRN
  Administered 2016-09-08: 13 mL via INTRAVENOUS

## 2016-09-10 ENCOUNTER — Telehealth: Payer: Self-pay | Admitting: Oncology

## 2016-09-10 ENCOUNTER — Encounter: Payer: Self-pay | Admitting: Radiation Oncology

## 2016-09-10 NOTE — Telephone Encounter (Signed)
Called Tracy Dodson and advised her that [facility] will be called tomorrow regarding a wheel chair.  Also advised her that [name] will be contacted regarding whether she will have a follow up appointment scheduled to go over the results of her MRI and PET scan.  Tracy Dodson verbalized understanding and agreement.

## 2016-09-11 ENCOUNTER — Other Ambulatory Visit: Payer: Self-pay | Admitting: Oncology

## 2016-09-11 ENCOUNTER — Other Ambulatory Visit: Payer: Self-pay | Admitting: Nurse Practitioner

## 2016-09-11 ENCOUNTER — Telehealth: Payer: Self-pay | Admitting: Oncology

## 2016-09-11 DIAGNOSIS — M546 Pain in thoracic spine: Secondary | ICD-10-CM

## 2016-09-11 DIAGNOSIS — C342 Malignant neoplasm of middle lobe, bronchus or lung: Secondary | ICD-10-CM

## 2016-09-11 NOTE — Telephone Encounter (Signed)
Tracy Dodson called and requested a refill on HYDROcodone-acetaminophen (NORCO/VICODIN) 5-325 MG.  She said she has enough for today and tomorrow morning.  She last had it filled on 08/30/16.

## 2016-09-12 ENCOUNTER — Encounter: Payer: Self-pay | Admitting: Radiation Oncology

## 2016-09-12 ENCOUNTER — Other Ambulatory Visit (HOSPITAL_COMMUNITY): Payer: PRIVATE HEALTH INSURANCE

## 2016-09-12 ENCOUNTER — Telehealth: Payer: Self-pay | Admitting: Oncology

## 2016-09-12 ENCOUNTER — Other Ambulatory Visit: Payer: Self-pay | Admitting: Oncology

## 2016-09-12 ENCOUNTER — Other Ambulatory Visit: Payer: Self-pay | Admitting: Medical Oncology

## 2016-09-12 ENCOUNTER — Encounter (HOSPITAL_COMMUNITY)
Admission: RE | Admit: 2016-09-12 | Discharge: 2016-09-12 | Disposition: A | Discharge status: Home / Self Care | Payer: PRIVATE HEALTH INSURANCE | Source: Ambulatory Visit | Attending: Radiation Oncology | Admitting: Radiation Oncology

## 2016-09-12 DIAGNOSIS — M546 Pain in thoracic spine: Secondary | ICD-10-CM

## 2016-09-12 DIAGNOSIS — C342 Malignant neoplasm of middle lobe, bronchus or lung: Secondary | ICD-10-CM | POA: Diagnosis not present

## 2016-09-12 DIAGNOSIS — R948 Abnormal results of function studies of other organs and systems: Secondary | ICD-10-CM | POA: Insufficient documentation

## 2016-09-12 MED ORDER — TECHNETIUM TC 99M MEDRONATE IV KIT
19.9000 | PACK | Freq: Once | INTRAVENOUS | Status: AC | PRN
  Administered 2016-09-12: 19.9 via INTRAVENOUS

## 2016-09-12 MED ORDER — HYDROCODONE-ACETAMINOPHEN 5-325 MG PO TABS: 2.0000 | ORAL_TABLET | ORAL | 0 refills | Status: DC | PRN

## 2016-09-12 NOTE — Progress Notes (Signed)
  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 Patient experienced 10/10 pain to her lower back ,right hip, and shoulder as well as constipation during treatment. Patient is having reimaging in light of these new complaints of pain.  Plan: The patient has completed radiation treatment. The patient will return to radiation oncology clinic for routine followup in one month. I advised them to call or return sooner if they have any questions or concerns related to their recovery or treatment.  -----------------------------------  Blair Promise, PhD, MD  This document serves as a record of services personally performed by Gery Pray, MD. It was created on his behalf by Bethann Humble, a trained medical scribe. The creation of this record is based on the scribe's personal observations and the provider's statements to them. This document has been checked and approved by the attending provider.

## 2016-09-12 NOTE — Telephone Encounter (Signed)
Called Tracy Dodson to see if she received the hydrocodone refill from [name] office.  She said that she did pick it up.  Also advised her that [name] would like to see her on Monday at 11:30 to review the results.  She verbalized understanding and agreement.

## 2016-09-15 ENCOUNTER — Other Ambulatory Visit: Payer: Self-pay | Admitting: Radiation Oncology

## 2016-09-16 ENCOUNTER — Ambulatory Visit: Payer: Self-pay | Admitting: Radiation Oncology

## 2016-09-16 ENCOUNTER — Encounter: Payer: Self-pay | Admitting: Radiation Oncology

## 2016-09-16 ENCOUNTER — Ambulatory Visit
Admission: RE | Admit: 2016-09-16 | Discharge: 2016-09-16 | Disposition: A | Discharge status: Home / Self Care | Payer: PRIVATE HEALTH INSURANCE | Source: Ambulatory Visit | Attending: Radiation Oncology | Admitting: Radiation Oncology

## 2016-09-16 DIAGNOSIS — Z923 Personal history of irradiation: Secondary | ICD-10-CM | POA: Insufficient documentation

## 2016-09-16 DIAGNOSIS — C7951 Secondary malignant neoplasm of bone: Secondary | ICD-10-CM | POA: Diagnosis not present

## 2016-09-16 DIAGNOSIS — C342 Malignant neoplasm of middle lobe, bronchus or lung: Secondary | ICD-10-CM | POA: Insufficient documentation

## 2016-09-16 DIAGNOSIS — Z888 Allergy status to other drugs, medicaments and biological substances status: Secondary | ICD-10-CM | POA: Insufficient documentation

## 2016-09-16 DIAGNOSIS — Z91041 Radiographic dye allergy status: Secondary | ICD-10-CM | POA: Insufficient documentation

## 2016-09-16 DIAGNOSIS — M549 Dorsalgia, unspecified: Secondary | ICD-10-CM

## 2016-09-16 MED ORDER — OXYCODONE HCL ER 20 MG PO T12A: 20.0000 mg | EXTENDED_RELEASE_TABLET | Freq: Two times a day (BID) | ORAL | 0 refills | Status: DC

## 2016-09-16 NOTE — Progress Notes (Signed)
Radiation Oncology         [phone] ________________________________  Name: Tracy Dodson MRN: [id number]  Date: 09/16/2016  DOB: April 01, 1951  Follow-Up Visit Note  CC: [name], MD  Curt Bears, MD    ICD-9-CM ICD-10-CM   1. Primary cancer of right middle lobe of lung (HCC) 162.4 C34.2     Diagnosis:  Recurrent non-small cell lung cancer initially diagnosed as stage IIIA (T1b N2M0) suspicious for adenocarcinoma with EGFR mutation in exon 19 and negative ALK gene translocation, now with oligo metastasis involving the left upper lobe and right lower lobe, Recent bone scan and thoracic MRI showing osseous metastasis  Interval Since Last Radiation:  10 days 08/27/16-09/06/16 54 Gy SBRT to left lung, 50 Gy SBRT to right lung  Narrative:  The patient returns today for routine follow-up to get her MRI and PET scan results. She reports 8/10 pain to her mid back that radiates to her right hip/leg. She continues to take Oxycontin BID and norco 1-2 tablets every 4 hours. She needs a refill on Oxycontin. She reports increased difficulty walking due to pain in the right hip area. She is using a wheelchair at home.                            ALLERGIES:  is allergic to compazine [prochlorperazine edisylate] and contrast media [iodinated diagnostic agents].  Meds: Current Outpatient Prescriptions  Medication Sig Dispense Refill  . citalopram (CELEXA) 20 MG tablet TK 1 T PO QD  4  . HYDROcodone-acetaminophen (NORCO/VICODIN) 5-325 MG tablet Take 2 tablets by mouth every 3 (three) hours as needed for moderate pain (may take 2 tablets every 3-4 hours prn for back pain). 60 tablet 0  . oxyCODONE (OXYCONTIN) 20 mg 12 hr tablet Take 1 tablet (20 mg total) by mouth every 12 (twelve) hours. 30 tablet 0  . predniSONE (DELTASONE) 10 MG tablet   0  . PREVIDENT 5000 DRY MOUTH 1.1 % GEL dental gel See admin instructions.  0  . TAGRISSO 80 MG tablet TAKE 1 TABLET BY MOUTH ONCE DAILY 30 tablet 2  .  acetaminophen (TYLENOL) 325 MG tablet Take 325 mg by mouth every 6 (six) hours as needed for moderate pain or headache.     . b complex vitamins tablet Take 1 tablet by mouth daily. Reported on 04/08/2016    . BIOTIN 5000 PO Take 10,000 mcg by mouth daily.     . Calcium Carbonate-Vitamin D (CALCIUM 600+D) 600-200 MG-UNIT TABS Take 1 tablet by mouth daily.    . cholecalciferol (VITAMIN D) 1000 UNITS tablet Take 2,000 Units by mouth daily.     [name] LORazepam (ATIVAN) 0.5 MG tablet Take one tab po 30 minutes prior to MRI or radiation treatment. (Patient not taking: Reported on 09/16/2016) 30 tablet 0  . Melatonin 5 MG TABS Take 5 mg by mouth at bedtime as needed. Reported on 04/08/2016    . Milk Thistle 1000 MG CAPS Take 1 capsule by mouth daily.    . Multiple Vitamins-Minerals (CENTRUM SILVER PO) Take 1 capsule by mouth daily.    . Omega-3 Fatty Acids (FISH OIL) 1000 MG CAPS Take 1 capsule by mouth daily. Reported on 04/08/2016    . OVER THE COUNTER MEDICATION Take 2 tablets by mouth at bedtime.    . pantoprazole (PROTONIX) 40 MG tablet Take 1 tablet (40 mg total) by mouth daily. Switch for any other PPI at similar  dose and frequency (Patient not taking: Reported on 09/16/2016) 30 tablet 0  . predniSONE (DELTASONE) 5 MG tablet Take 5 mg by mouth daily with breakfast. Taking 3 tabs('15mg'$ ) in the AM and 2 tabs('10mg'$ ) in the PM    . Saccharomyces boulardii (FLORASTOR PO) Take 1 capsule by mouth daily. Reported on 04/08/2016     No current facility-administered medications for this encounter.     Physical Findings: The patient is in no acute distress. Patient is alert and oriented.  height is 5' 5.5" (1.664 m) and weight is 133 lb 3.2 oz (60.4 kg). Her oral temperature is 98.8 F (37.1 C). Her blood pressure is 143/80 (abnormal) and her pulse is 105 (abnormal). Her oxygen saturation is 98%. .    Lab Findings: Lab Results  Component Value Date   WBC 17.1 (H) 08/27/2016   HGB 10.9 (L) 08/27/2016   HCT  35.6 08/27/2016   MCV 89.2 08/27/2016   PLT 70 (L) 08/27/2016    Radiographic Findings: Dg Thoracic Spine W/swimmers  Result Date: 08/30/2016 CLINICAL DATA:  Fall backwards onto radiation therapy table 3 days ago. Initial encounter EXAM: THORACIC SPINE - 3 VIEWS COMPARISON:  Body CT 07/19/2016 FINDINGS: Lower thoracic compression fracture with moderate height loss, new from 07/19/2016. The fracture is likely at the T10 level. Bilateral pulmonary nodules, known. Exaggerated thoracic kyphosis and generalized disc narrowing. IMPRESSION: Lower thoracic (likely T10) moderate compression fracture that is acute or subacute based development since 07/19/2016 CT. Electronically Signed   By: Monte Fantasia M.D.   On: 08/30/2016 09:50   Ct Thoracic Spine Wo Contrast  Result Date: 08/30/2016 CLINICAL DATA:  Acute mid back pain.  History of lung cancer. EXAM: CT THORACIC SPINE WITHOUT CONTRAST TECHNIQUE: Multidetector CT images of the thoracic were obtained using the standard protocol without intravenous contrast. COMPARISON:  CT scan of July 19, 2016.  Radiographs of same day. FINDINGS: Alignment: No spondylolisthesis is noted. Vertebrae: There is interval development of moderate compression deformity of T10 vertebral body concerning for acute or subacute fracture. There is no retropulsion of fragments into spinal canal. Paraspinal and other soft tissues: Irregular left upper lobe mass is significantly increased in size, measuring 2.4 x 2.1 cm currently. Stable 3.0 x 2.5 cm right lower lobe mass is noted. No significant central spinal canal stenosis is noted. Disc levels: Multilevel degenerative disc disease is noted in the lower thoracic spine. IMPRESSION: Left upper lobe irregular mass is increased in size compared to prior exam, concerning for worsening malignancy. Stable right lower lobe mass is noted also concerning for malignancy or metastatic disease. Interval development of moderate compression deformity  of T10 vertebral body concerning for acute or subacute fracture. Given the history of malignancy, metastatic disease cannot be excluded, although no significant lytic destruction is seen at this time. MRI may be performed for further evaluation. These results will be called to the ordering clinician or representative by the Radiologist Assistant, and communication documented in the PACS or zVision Dashboard. Electronically Signed   By: Marijo Conception, M.D.   On: 08/30/2016 13:11   Mr Thoracic Spine W Wo Contrast  Result Date: 09/08/2016 CLINICAL DATA:  Weakness in the back in the right leg. Pain in the back, right leg, and shoulder. Bowel and bladder habit changes. Right middle lobe lung cancer. Compression fractures in the thoracic spine, query malignant involvement. EXAM: MRI THORACIC SPINE WITHOUT AND WITH CONTRAST TECHNIQUE: Multiplanar and multiecho pulse sequences of the thoracic spine were obtained without and  with intravenous contrast. CONTRAST:  63m MULTIHANCE GADOBENATE DIMEGLUMINE 529 MG/ML IV SOLN COMPARISON:  08/30/2016 FINDINGS: MRI THORACIC SPINE FINDINGS Alignment: No thoracic spine malalignment. There is minimal grade 1 degenerative retrolisthesis incidentally seen at the L2- 3 level. Vertebrae: There is a 60% compression fracture of T10 mainly involving the inferior endplate with 4 mm of posterior bony retropulsion along the inferior endplate. Generally there is reduced T1 and some faintly increased T2 signal throughout the vertebral body along with paraspinal edema, but I do not demonstrate specific or highly characteristic findings of metastatic disease at the T10 level. However, there is an abnormal T2 hyperintense and hand enhancing lesion in the posterior aspect of the right T4 vertebral body measuring 1.4 by 1.8 by 2.2 cm, also with extension through the right pedicle and into the right transverse process and concern for a fracture the right pedicle and transverse process base, or bony  destructive findings. This appearance is more concerning for tumor, and there is potentially involvement of the spinous process and left lamina as well. There is minimal chronic superior endplate concavity at T4 and T5. Thoracic kyphosis is present. Cord:  No significant abnormal spinal cord signal is observed. Paraspinal and other soft tissues: I do not see any definite paraspinal tumor. There is paraspinal edema by the T10 compression fracture. Tumor is observed in the left lung and possibly in the right lower lobe as well, better characterized on dedicated chest imaging. Disc levels: No findings of impingement or significant degenerative disc disease above the T9-10 level. T9-10: Borderline right foraminal stenosis and borderline central narrowing of the thecal sac due to facet arthropathy. T10-11: Mild central narrowing of the thecal sac due to posterior retropulsion from the inferior endplate compression fracture at T10. T11-12: Unremarkable. T12-L1: Unremarkable. IMPRESSION: 1. Abnormal lesion compatible with tumor posteriorly in the T4 vertebral body and extending around in the posterior elements and transverse processes. No epidural tumor identified. No cord lesion seen. 2. 60% compression fracture of T10 is probably benign but is associated with 4 mm of posterior bony retropulsion which causes mild central narrowing of the thecal sac at the T10-11 level. 3. Borderline right foraminal stenosis and borderline central narrowing of the thecal sac at T9-10 due to facet arthropathy. 4. Thoracic kyphosis. 5. Abnormal signal in both lungs favoring tumor. Electronically Signed   By: WVan ClinesM.D.   On: 09/08/2016 17:10   Nm Bone Scan Whole Body  Result Date: 09/12/2016 CLINICAL DATA:  History of lung carcinoma, with mid back pain and right hip pain evaluate for metastasis EXAM: NUCLEAR MEDICINE WHOLE BODY BONE SCAN TECHNIQUE: Whole body anterior and posterior images were obtained approximately 3 hours  after intravenous injection of radiopharmaceutical. RADIOPHARMACEUTICALS:  19.9 mCi Technetium-923mDP IV COMPARISON:  PET-CT of 09/19/2015 and CT chest of 12/28/2012 FINDINGS: There is activity in the lower thoracic spine in the region of T10 or T11 worrisome for metastatic deposit. Somewhat mottled activity throughout the ribs also could indicate metastases. Also, there is activity within the proximal right femur -hip pre some this could be degenerative, but metastatic involvement is difficult to exclude. Probable degenerative change in both shoulders is noted symmetrically. IMPRESSION: 1. Increased activity in the lower thoracic spine in the region of T10 worrisome for metastatic lesion or compression deformity. Correlate with plain films. 2. Increased activity in the right hip. Correlate with plain films as well. 3. Somewhat mottled activity throughout multiple ribs. Metastatic lesions cannot be excluded. Electronically Signed   By:  Ivar Drape M.D.   On: 09/12/2016 13:54    Impression:  Non-small cell lung cancer now with osseous metastasis resulting in pain along the right pelvis. Patient would be a candidate for palliative radiation therapy directed the right hip. Patient's bone scan shows uptake in the upper femur and right pelvis region but is rather subtle on review. The patient will proceed with the treatment planning scan of the right pelvis and femur tomorrow. Depending on results of the scan she may require an MRI of the pelvis for further evaluation. Patient is asymptomatic from her lesion in the T4 area. Her lesion in the T10 area appears to be benign and she will be set up for consultation with interventional radiology for kyphoplasty or vertebroplasty.  Plan:  Simulation tomorrow for treatments directed at the right pelvis region  ____________________________________  This document serves as a record of services personally performed by Gery Pray, MD. It was created on his behalf by  Bethann Humble, a trained medical scribe. The creation of this record is based on the scribe's personal observations and the provider's statements to them. This document has been checked and approved by the attending provider.

## 2016-09-16 NOTE — Progress Notes (Signed)
Tracy Dodson is here for follow up to get her MRI and PET scan results.  She reports having pain at an 8/10 in her mid back and right hip/leg.  She continues to take OxyContin twice a day and norco 1-2 tablets every 4 hours.  She needs a refill on OxyContin.  She reports having more trouble walking.  She is using a wheelchair at home now.  BP (!) 143/80 (BP Location: Left Arm, Patient Position: Sitting)   Pulse (!) 105   Temp 98.8 F (37.1 C) (Oral)   Ht 5' 5.5" (1.664 m)   Wt 133 lb 3.2 oz (60.4 kg)   SpO2 98%   BMI 21.83 kg/m    Wt Readings from Last 3 Encounters:  09/16/16 133 lb 3.2 oz (60.4 kg)  09/02/16 142 lb 3.2 oz (64.5 kg)  08/27/16 137 lb 6.4 oz (62.3 kg)

## 2016-09-17 ENCOUNTER — Encounter: Payer: Self-pay | Admitting: Internal Medicine

## 2016-09-17 ENCOUNTER — Encounter: Payer: Self-pay | Admitting: Radiation Oncology

## 2016-09-17 ENCOUNTER — Ambulatory Visit
Admission: RE | Admit: 2016-09-17 | Discharge: 2016-09-17 | Disposition: A | Discharge status: Home / Self Care | Payer: PRIVATE HEALTH INSURANCE | Source: Ambulatory Visit | Attending: Radiation Oncology | Admitting: Radiation Oncology

## 2016-09-17 DIAGNOSIS — C7802 Secondary malignant neoplasm of left lung: Secondary | ICD-10-CM | POA: Diagnosis not present

## 2016-09-17 DIAGNOSIS — C3490 Malignant neoplasm of unspecified part of unspecified bronchus or lung: Secondary | ICD-10-CM | POA: Diagnosis present

## 2016-09-17 DIAGNOSIS — C342 Malignant neoplasm of middle lobe, bronchus or lung: Secondary | ICD-10-CM

## 2016-09-17 DIAGNOSIS — M4854XA Collapsed vertebra, not elsewhere classified, thoracic region, initial encounter for fracture: Secondary | ICD-10-CM | POA: Diagnosis not present

## 2016-09-17 DIAGNOSIS — C7801 Secondary malignant neoplasm of right lung: Secondary | ICD-10-CM | POA: Diagnosis not present

## 2016-09-17 DIAGNOSIS — Z510 Encounter for antineoplastic radiation therapy: Secondary | ICD-10-CM | POA: Diagnosis not present

## 2016-09-17 DIAGNOSIS — M25511 Pain in right shoulder: Secondary | ICD-10-CM | POA: Diagnosis not present

## 2016-09-17 DIAGNOSIS — M545 Low back pain: Secondary | ICD-10-CM | POA: Diagnosis not present

## 2016-09-17 NOTE — Telephone Encounter (Signed)
Fracture or cancer on femur?

## 2016-09-17 NOTE — Progress Notes (Signed)
  Radiation Oncology         [phone] ________________________________  Name: EMUNAH TEXIDOR MRN: [id number]  Date: 09/17/2016  DOB: 07/10/1951  SIMULATION AND TREATMENT PLANNING NOTE    ICD-9-CM ICD-10-CM   1. Primary cancer of right middle lobe of lung (HCC) 162.4 C34.2     DIAGNOSIS:  Recurrent non-small cell lung cancer initially diagnosed as stage IIIA (T1b N2M0) suspicious for adenocarcinoma with EGFR mutation in exon 19 and negative ALK gene translocation, now with oligo metastasis involving the left upper lobe and right lower lobe, Recent bone scan and thoracic MRI showing osseous metastasis  NARRATIVE:  The patient was brought to the [facility].  Identity was confirmed.  All relevant records and images related to the planned course of therapy were reviewed.  The patient freely provided informed written consent to proceed with treatment after reviewing the details related to the planned course of therapy. The consent form was witnessed and verified by the simulation staff.  Then, the patient was set-up in a stable reproducible  supine position for radiation therapy.  CT images were obtained.  Surface markings were placed.  The CT images were loaded into the planning software.  Then the target and avoidance structures were contoured.  Treatment planning then occurred.  The radiation prescription was entered and confirmed.  Then, I designed and supervised the construction of a total of 3 medically necessary complex treatment devices.  I have requested : Isodose Plan.  I have ordered:dose calc.  PLAN:  The patient will receive 30 Gy in 10 fractions.  -----------------------------------  Blair Promise, PhD, MD  This document serves as a record of services personally performed by Gery Pray, MD. It was created on his behalf by Darcus Austin, a trained medical scribe. The creation of this record is based on the scribe's personal observations and the provider's  statements to them. This document has been checked and approved by the attending provider.

## 2016-09-19 ENCOUNTER — Other Ambulatory Visit: Payer: Self-pay | Admitting: Medical Oncology

## 2016-09-19 ENCOUNTER — Other Ambulatory Visit: Payer: Self-pay | Admitting: Internal Medicine

## 2016-09-19 ENCOUNTER — Encounter: Payer: Self-pay | Admitting: Radiation Oncology

## 2016-09-19 ENCOUNTER — Telehealth: Payer: Self-pay | Admitting: Medical Oncology

## 2016-09-19 DIAGNOSIS — M546 Pain in thoracic spine: Secondary | ICD-10-CM

## 2016-09-19 NOTE — Telephone Encounter (Signed)
I called pt in response to email requesting hydrocodone refill . She stated her hip pain is still not controlled with current pain tx regimen. Note sent to [facility] and his nurse.

## 2016-09-20 ENCOUNTER — Emergency Department (HOSPITAL_COMMUNITY): Payer: PRIVATE HEALTH INSURANCE

## 2016-09-20 ENCOUNTER — Inpatient Hospital Stay (HOSPITAL_COMMUNITY)
Admission: EM | Admit: 2016-09-20 | Discharge: 2016-09-26 | DRG: 481 | Disposition: A | Discharge status: Skilled Nursing Facility | Payer: PRIVATE HEALTH INSURANCE | Attending: Internal Medicine | Admitting: Internal Medicine

## 2016-09-20 ENCOUNTER — Encounter (HOSPITAL_COMMUNITY): Payer: Self-pay | Admitting: Emergency Medicine

## 2016-09-20 DIAGNOSIS — S72141A Displaced intertrochanteric fracture of right femur, initial encounter for closed fracture: Principal | ICD-10-CM | POA: Diagnosis present

## 2016-09-20 DIAGNOSIS — G8929 Other chronic pain: Secondary | ICD-10-CM | POA: Diagnosis present

## 2016-09-20 DIAGNOSIS — M353 Polymyalgia rheumatica: Secondary | ICD-10-CM | POA: Diagnosis present

## 2016-09-20 DIAGNOSIS — S72141D Displaced intertrochanteric fracture of right femur, subsequent encounter for closed fracture with routine healing: Secondary | ICD-10-CM | POA: Diagnosis not present

## 2016-09-20 DIAGNOSIS — W182XXA Fall in (into) shower or empty bathtub, initial encounter: Secondary | ICD-10-CM | POA: Diagnosis present

## 2016-09-20 DIAGNOSIS — M546 Pain in thoracic spine: Secondary | ICD-10-CM

## 2016-09-20 DIAGNOSIS — Y93E1 Activity, personal bathing and showering: Secondary | ICD-10-CM | POA: Diagnosis not present

## 2016-09-20 DIAGNOSIS — C342 Malignant neoplasm of middle lobe, bronchus or lung: Secondary | ICD-10-CM | POA: Diagnosis present

## 2016-09-20 DIAGNOSIS — Y92002 Bathroom of unspecified non-institutional (private) residence single-family (private) house as the place of occurrence of the external cause: Secondary | ICD-10-CM | POA: Diagnosis not present

## 2016-09-20 DIAGNOSIS — Z803 Family history of malignant neoplasm of breast: Secondary | ICD-10-CM

## 2016-09-20 DIAGNOSIS — C7931 Secondary malignant neoplasm of brain: Secondary | ICD-10-CM | POA: Diagnosis not present

## 2016-09-20 DIAGNOSIS — W19XXXA Unspecified fall, initial encounter: Secondary | ICD-10-CM

## 2016-09-20 DIAGNOSIS — Z801 Family history of malignant neoplasm of trachea, bronchus and lung: Secondary | ICD-10-CM

## 2016-09-20 DIAGNOSIS — S22079A Unspecified fracture of T9-T10 vertebra, initial encounter for closed fracture: Secondary | ICD-10-CM

## 2016-09-20 DIAGNOSIS — D62 Acute posthemorrhagic anemia: Secondary | ICD-10-CM | POA: Diagnosis present

## 2016-09-20 DIAGNOSIS — M1990 Unspecified osteoarthritis, unspecified site: Secondary | ICD-10-CM | POA: Diagnosis present

## 2016-09-20 DIAGNOSIS — Z6822 Body mass index (BMI) 22.0-22.9, adult: Secondary | ICD-10-CM

## 2016-09-20 DIAGNOSIS — K449 Diaphragmatic hernia without obstruction or gangrene: Secondary | ICD-10-CM | POA: Diagnosis present

## 2016-09-20 DIAGNOSIS — C7951 Secondary malignant neoplasm of bone: Secondary | ICD-10-CM | POA: Diagnosis present

## 2016-09-20 DIAGNOSIS — Z87891 Personal history of nicotine dependence: Secondary | ICD-10-CM

## 2016-09-20 DIAGNOSIS — E440 Moderate protein-calorie malnutrition: Secondary | ICD-10-CM | POA: Insufficient documentation

## 2016-09-20 DIAGNOSIS — Z419 Encounter for procedure for purposes other than remedying health state, unspecified: Secondary | ICD-10-CM

## 2016-09-20 DIAGNOSIS — F329 Major depressive disorder, single episode, unspecified: Secondary | ICD-10-CM | POA: Diagnosis present

## 2016-09-20 DIAGNOSIS — E785 Hyperlipidemia, unspecified: Secondary | ICD-10-CM | POA: Diagnosis present

## 2016-09-20 DIAGNOSIS — R262 Difficulty in walking, not elsewhere classified: Secondary | ICD-10-CM

## 2016-09-20 DIAGNOSIS — I10 Essential (primary) hypertension: Secondary | ICD-10-CM | POA: Diagnosis present

## 2016-09-20 DIAGNOSIS — S72009A Fracture of unspecified part of neck of unspecified femur, initial encounter for closed fracture: Secondary | ICD-10-CM | POA: Diagnosis present

## 2016-09-20 DIAGNOSIS — S72001A Fracture of unspecified part of neck of right femur, initial encounter for closed fracture: Secondary | ICD-10-CM

## 2016-09-20 LAB — CBC WITH DIFFERENTIAL/PLATELET
BAND NEUTROPHILS: 0 %
BASOS ABS: 0 10*3/uL (ref 0.0–0.1)
BASOS PCT: 0 %
Blasts: 0 %
EOS ABS: 0.1 10*3/uL (ref 0.0–0.7)
Eosinophils Relative: 1 %
HCT: 30.8 % — ABNORMAL LOW (ref 36.0–46.0)
Hemoglobin: 9.5 g/dL — ABNORMAL LOW (ref 12.0–15.0)
LYMPHS ABS: 2.5 10*3/uL (ref 0.7–4.0)
LYMPHS PCT: 28 %
MCH: 26.7 pg (ref 26.0–34.0)
MCHC: 30.8 g/dL (ref 30.0–36.0)
MCV: 86.5 fL (ref 78.0–100.0)
METAMYELOCYTES PCT: 0 %
MONOS PCT: 21 %
Monocytes Absolute: 1.9 10*3/uL — ABNORMAL HIGH (ref 0.1–1.0)
Myelocytes: 2 %
NEUTROS ABS: 4.6 10*3/uL (ref 1.7–7.7)
Neutrophils Relative %: 48 %
PLATELETS: 76 10*3/uL — AB (ref 150–400)
PROMYELOCYTES ABS: 0 %
RBC: 3.56 MIL/uL — ABNORMAL LOW (ref 3.87–5.11)
RDW: 20.1 % — ABNORMAL HIGH (ref 11.5–15.5)
WBC: 9.1 10*3/uL (ref 4.0–10.5)
nRBC: 1 /100 WBC — ABNORMAL HIGH

## 2016-09-20 LAB — ABO/RH: ABO/RH(D): O POS

## 2016-09-20 LAB — BASIC METABOLIC PANEL
ANION GAP: 12 (ref 5–15)
BUN: 7 mg/dL (ref 6–20)
CALCIUM: 9.4 mg/dL (ref 8.9–10.3)
CO2: 24 mmol/L (ref 22–32)
Chloride: 101 mmol/L (ref 101–111)
Creatinine, Ser: 0.88 mg/dL (ref 0.44–1.00)
GFR calc Af Amer: >60 mL/min (ref >60)
GLUCOSE: 142 mg/dL — AB (ref 65–99)
Potassium: 3.4 mmol/L — ABNORMAL LOW (ref 3.5–5.1)
Sodium: 137 mmol/L (ref 135–145)

## 2016-09-20 LAB — TYPE AND SCREEN
ABO/RH(D): O POS
Antibody Screen: NEGATIVE

## 2016-09-20 LAB — PROTIME-INR
INR: 1.16
PROTHROMBIN TIME: 14.9 s (ref 11.4–15.2)

## 2016-09-20 MED ORDER — CITALOPRAM HYDROBROMIDE 20 MG PO TABS
20.0000 mg | ORAL_TABLET | Freq: Every day | ORAL | Status: DC
  Administered 2016-09-21 – 2016-09-26 (×6): 20 mg via ORAL
  Filled 2016-09-20 (×6): qty 1

## 2016-09-20 MED ORDER — KCL IN DEXTROSE-NACL 20-5-0.45 MEQ/L-%-% IV SOLN
INTRAVENOUS | Status: DC
  Administered 2016-09-20: 22:00:00 via INTRAVENOUS
  Filled 2016-09-20 (×2): qty 1000

## 2016-09-20 MED ORDER — VITAMIN D 1000 UNITS PO TABS
2000.0000 [IU] | ORAL_TABLET | Freq: Every day | ORAL | Status: DC
  Administered 2016-09-21 – 2016-09-26 (×6): 2000 [IU] via ORAL
  Filled 2016-09-20 (×6): qty 2

## 2016-09-20 MED ORDER — PRO-STAT SUGAR FREE PO LIQD
30.0000 mL | Freq: Every day | ORAL | Status: DC
  Administered 2016-09-21 – 2016-09-26 (×6): 30 mL via ORAL
  Filled 2016-09-20 (×6): qty 30

## 2016-09-20 MED ORDER — ADULT MULTIVITAMIN W/MINERALS CH
1.0000 | ORAL_TABLET | Freq: Every day | ORAL | Status: DC
  Administered 2016-09-22 – 2016-09-26 (×5): 1 via ORAL
  Filled 2016-09-20 (×5): qty 1

## 2016-09-20 MED ORDER — POTASSIUM CHLORIDE 2 MEQ/ML IV SOLN
30.0000 meq | Freq: Once | INTRAVENOUS | Status: AC
  Administered 2016-09-20: 30 meq via INTRAVENOUS
  Filled 2016-09-20: qty 15

## 2016-09-20 MED ORDER — PREDNISONE 1 MG PO TABS
2.0000 mg | ORAL_TABLET | Freq: Every day | ORAL | Status: DC
  Administered 2016-09-20 – 2016-09-25 (×6): 2 mg via ORAL
  Filled 2016-09-20 (×7): qty 2

## 2016-09-20 MED ORDER — PREDNISONE 1 MG PO TABS
3.0000 mg | ORAL_TABLET | Freq: Every day | ORAL | Status: DC
  Administered 2016-09-22 – 2016-09-26 (×5): 3 mg via ORAL
  Filled 2016-09-20 (×6): qty 3

## 2016-09-20 MED ORDER — HYDROMORPHONE HCL 2 MG/ML IJ SOLN
1.0000 mg | INTRAMUSCULAR | Status: AC | PRN
  Administered 2016-09-20 (×2): 1 mg via INTRAVENOUS
  Filled 2016-09-20 (×3): qty 1

## 2016-09-20 MED ORDER — HYDROMORPHONE HCL 2 MG/ML IJ SOLN
1.0000 mg | INTRAMUSCULAR | Status: DC | PRN
  Administered 2016-09-20 – 2016-09-21 (×6): 1 mg via INTRAVENOUS
  Filled 2016-09-20 (×6): qty 1

## 2016-09-20 MED ORDER — HYDROMORPHONE HCL 2 MG/ML IJ SOLN: 1.0000 mg | INTRAMUSCULAR | Status: DC | PRN

## 2016-09-20 MED ORDER — OXYCODONE HCL ER 20 MG PO T12A
20.0000 mg | EXTENDED_RELEASE_TABLET | Freq: Two times a day (BID) | ORAL | Status: DC
  Administered 2016-09-20 – 2016-09-26 (×11): 20 mg via ORAL
  Filled 2016-09-20 (×11): qty 1

## 2016-09-20 MED ORDER — FAMOTIDINE IN NACL 20-0.9 MG/50ML-% IV SOLN
20.0000 mg | Freq: Two times a day (BID) | INTRAVENOUS | Status: DC
  Administered 2016-09-20 – 2016-09-22 (×5): 20 mg via INTRAVENOUS
  Filled 2016-09-20 (×7): qty 50

## 2016-09-20 MED ORDER — OSIMERTINIB MESYLATE 80 MG PO TABS
80.0000 mg | ORAL_TABLET | Freq: Every day | ORAL | Status: DC
  Administered 2016-09-20 – 2016-09-26 (×6): 80 mg via ORAL
  Filled 2016-09-20 (×7): qty 1

## 2016-09-20 MED ORDER — ENSURE ENLIVE PO LIQD
237.0000 mL | Freq: Two times a day (BID) | ORAL | Status: DC
  Administered 2016-09-22 – 2016-09-26 (×10): 237 mL via ORAL

## 2016-09-20 MED ORDER — HYDROMORPHONE HCL 2 MG/ML IJ SOLN
0.5000 mg | INTRAMUSCULAR | Status: AC | PRN
  Administered 2016-09-20 (×2): 0.5 mg via INTRAVENOUS
  Filled 2016-09-20 (×2): qty 1

## 2016-09-20 MED ORDER — ONDANSETRON HCL 4 MG/2ML IJ SOLN: 4.0000 mg | Freq: Three times a day (TID) | INTRAMUSCULAR | Status: DC | PRN

## 2016-09-20 MED ORDER — SODIUM CHLORIDE 0.9 % IV SOLN
INTRAVENOUS | Status: DC
  Administered 2016-09-20: 08:00:00 via INTRAVENOUS

## 2016-09-20 MED ORDER — POLYETHYLENE GLYCOL 3350 17 G PO PACK: 17.0000 g | PACK | Freq: Every day | ORAL | Status: DC | PRN

## 2016-09-20 MED ORDER — HYDROCODONE-ACETAMINOPHEN 5-325 MG PO TABS
1.0000 | ORAL_TABLET | ORAL | Status: DC | PRN
  Administered 2016-09-20 – 2016-09-21 (×3): 2 via ORAL
  Filled 2016-09-20 (×3): qty 2

## 2016-09-20 MED ORDER — POTASSIUM CHLORIDE IN NACL 20-0.45 MEQ/L-% IV SOLN
INTRAVENOUS | Status: DC
  Filled 2016-09-20: qty 1000

## 2016-09-20 NOTE — Progress Notes (Signed)
Pt confirms with ED CM that she has had assist and DME from [facility] Pt has DME at home to include a 3n1, w/c

## 2016-09-20 NOTE — Progress Notes (Signed)
Orthopedic Tech Progress Note Patient Details:  Tracy Dodson [birth year] [id number]   Applied Buck traction with 10lbs to right leg.  Patient tolerated well. Informed pt of care of device. Informed pt if pain is too much to bare please request ortho tech to return to room.  Right Leg.   [name] 09/20/2016, 7:09 PM

## 2016-09-20 NOTE — ED Notes (Addendum)
Attempted placing foley twice, unsuccessful.

## 2016-09-20 NOTE — ED Notes (Signed)
Bed: WA02 Expected date: 09/20/16 Expected time: 7:37 AM Means of arrival: Ambulance Comments: Fall/hip pain
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
## 2016-09-20 NOTE — ED Triage Notes (Addendum)
Pt fell in shower, slipped on tile, fell onto right hip. Having hip pain, currently seeking trx for bone CA in right hip, hx of injections to hip. Has not had pain meds in route, took oxycontin ER around 0630 this morning. EMS unable to assess any shortness/rotation, is tender to palpation.

## 2016-09-20 NOTE — ED Notes (Signed)
Pulled Dilaudid from Pyxis, had originally documented waste at [facility] as being 1.5 mg. When getting to the room the order had been changed from 0.5 mg to 1 mg. I had not wasted the dilaudid yet, and proceeded to give the ordered 1 mg. Wasted in the sharps box in the room at that time. No extra Dilaudid need be pulled from the Pyxis. Notified charge nurse and Pharmacy.

## 2016-09-20 NOTE — Progress Notes (Signed)
Initial Nutrition Assessment  DOCUMENTATION CODES:   Non-severe (moderate) malnutrition in context of chronic illness  INTERVENTION:  Diet advancement per MD Provide Ensure Enlive po BID, each supplement provides 350 kcal and 20 grams of protein Provide Multivitamin with minerals daily Provide 30 ml pro-stat daily, provides 100 kcal and 15 grams of protein   NUTRITION DIAGNOSIS:   Malnutrition related to chronic illness as evidenced by energy intake < 75% for > or equal to 1 month, moderate depletion of body fat, moderate depletions of muscle mass, percent weight loss.   GOAL:   Patient will meet greater than or equal to 90% of their needs   MONITOR:   Diet advancement, Supplement acceptance, PO intake, Labs, Weight trends, Skin, I & O's  REASON FOR ASSESSMENT:   Malnutrition Screening Tool    ASSESSMENT:   65 y.o. 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 Patient reported that she has pathological compression fracture of her back and was also told to have right hip involvement with metastasis. Patient   Pt reports having a poor appetite for the past several months due to GI upset and diarrhea which she relates to Simms medication. She reports eating about 2/3 of amount of food that she used to eat- 25-30% less than usual. She takes a probiotic and daily multivitamin and focuses on eating protein and leafy green vegetables. RD discussed options for increasing calories and protein and recommended nutritional supplements to which patient was agreeable. Pt currently NPO. She reports plan is to go to surgery this afternoon. Pt has moderate muscle wasting and moderate fat wasting per nutrition-focused physical exam.   Labs: low potassium, low hemoglobin   Diet Order:  Diet NPO time  specified  Skin:  Reviewed, no issues  Last BM:  PTA  Height:   Ht Readings from Last 1 Encounters:  09/20/16 '5\' 5"'$  (1.651 m)    Weight:   Wt Readings from Last 1 Encounters:  09/20/16 133 lb (60.3 kg)    Ideal Body Weight:  56.8 kg  BMI:  Body mass index is 22.13 kg/m.  Estimated Nutritional Needs:   Kcal:  1800-2000  Protein:  80-90 grams  Fluid:  1.8 L/day  EDUCATION NEEDS:   No education needs identified at this time  [place], CSP, LDN Inpatient Clinical Dietitian Pager: [phone] After Hours Pager: [phone]

## 2016-09-20 NOTE — Progress Notes (Signed)
Spoke with Santiago Glad of [facility] [phone] about pt transfer to [facility] and will need to be followed for d/c needs

## 2016-09-20 NOTE — ED Notes (Signed)
Patient transported to X-ray 
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
## 2016-09-21 ENCOUNTER — Inpatient Hospital Stay (HOSPITAL_COMMUNITY): Payer: PRIVATE HEALTH INSURANCE

## 2016-09-21 ENCOUNTER — Encounter: Payer: Self-pay | Admitting: Internal Medicine

## 2016-09-21 ENCOUNTER — Inpatient Hospital Stay (HOSPITAL_COMMUNITY): Payer: PRIVATE HEALTH INSURANCE | Admitting: Anesthesiology

## 2016-09-21 ENCOUNTER — Encounter (HOSPITAL_COMMUNITY)
Admission: EM | Disposition: A | Discharge status: Skilled Nursing Facility | Payer: Self-pay | Source: Home / Self Care | Attending: Internal Medicine

## 2016-09-21 ENCOUNTER — Encounter: Payer: Self-pay | Admitting: Radiation Oncology

## 2016-09-21 DIAGNOSIS — C342 Malignant neoplasm of middle lobe, bronchus or lung: Secondary | ICD-10-CM

## 2016-09-21 DIAGNOSIS — S72141A Displaced intertrochanteric fracture of right femur, initial encounter for closed fracture: Secondary | ICD-10-CM

## 2016-09-21 DIAGNOSIS — C7951 Secondary malignant neoplasm of bone: Secondary | ICD-10-CM

## 2016-09-21 DIAGNOSIS — R000 Tachycardia, unspecified: Secondary | ICD-10-CM

## 2016-09-21 HISTORY — PX: INTRAMEDULLARY (IM) NAIL INTERTROCHANTERIC: SHX5875

## 2016-09-21 LAB — BASIC METABOLIC PANEL
ANION GAP: 6 (ref 5–15)
BUN: 6 mg/dL (ref 6–20)
CALCIUM: 8.7 mg/dL — AB (ref 8.9–10.3)
CHLORIDE: 105 mmol/L (ref 101–111)
CO2: 27 mmol/L (ref 22–32)
Creatinine, Ser: 0.74 mg/dL (ref 0.44–1.00)
GFR calc non Af Amer: >60 mL/min (ref >60)
Glucose, Bld: 137 mg/dL — ABNORMAL HIGH (ref 65–99)
Potassium: 4.3 mmol/L (ref 3.5–5.1)
Sodium: 138 mmol/L (ref 135–145)

## 2016-09-21 LAB — CBC
HEMATOCRIT: 27.6 % — AB (ref 36.0–46.0)
HEMATOCRIT: 27.9 % — AB (ref 36.0–46.0)
HEMOGLOBIN: 7.8 g/dL — AB (ref 12.0–15.0)
HEMOGLOBIN: 8.2 g/dL — AB (ref 12.0–15.0)
MCH: 25.4 pg — ABNORMAL LOW (ref 26.0–34.0)
MCH: 26.9 pg (ref 26.0–34.0)
MCHC: 28.3 g/dL — ABNORMAL LOW (ref 30.0–36.0)
MCHC: 29.4 g/dL — AB (ref 30.0–36.0)
MCV: 89.9 fL (ref 78.0–100.0)
MCV: 91.5 fL (ref 78.0–100.0)
Platelets: 65 10*3/uL — ABNORMAL LOW (ref 150–400)
Platelets: 64 10*3/uL — ABNORMAL LOW (ref 150–400)
RBC: 3.07 MIL/uL — AB (ref 3.87–5.11)
RBC: 3.05 MIL/uL — AB (ref 3.87–5.11)
RDW: 20.5 % — AB (ref 11.5–15.5)
RDW: 20.6 % — ABNORMAL HIGH (ref 11.5–15.5)
WBC: 8.2 10*3/uL (ref 4.0–10.5)
WBC: 8.2 10*3/uL (ref 4.0–10.5)

## 2016-09-21 LAB — SURGICAL PCR SCREEN
MRSA, PCR: NEGATIVE
STAPHYLOCOCCUS AUREUS: NEGATIVE

## 2016-09-21 LAB — PREPARE RBC (CROSSMATCH)

## 2016-09-21 SURGERY — FIXATION, FRACTURE, INTERTROCHANTERIC, WITH INTRAMEDULLARY ROD
Anesthesia: General | Site: Leg Upper | Laterality: Right

## 2016-09-21 MED ORDER — FENTANYL CITRATE (PF) 100 MCG/2ML IJ SOLN
INTRAMUSCULAR | Status: DC | PRN
  Administered 2016-09-21: 25 ug via INTRAVENOUS
  Administered 2016-09-21: 50 ug via INTRAVENOUS
  Administered 2016-09-21: 25 ug via INTRAVENOUS

## 2016-09-21 MED ORDER — ONDANSETRON HCL 4 MG PO TABS: 4.0000 mg | ORAL_TABLET | Freq: Four times a day (QID) | ORAL | Status: DC | PRN

## 2016-09-21 MED ORDER — OXYCODONE HCL 5 MG/5ML PO SOLN: 5.0000 mg | Freq: Once | ORAL | Status: DC | PRN

## 2016-09-21 MED ORDER — ROCURONIUM BROMIDE 100 MG/10ML IV SOLN
INTRAVENOUS | Status: DC | PRN
  Administered 2016-09-21: 40 mg via INTRAVENOUS

## 2016-09-21 MED ORDER — METHOCARBAMOL 500 MG PO TABS: 500.0000 mg | ORAL_TABLET | Freq: Four times a day (QID) | ORAL | Status: DC | PRN

## 2016-09-21 MED ORDER — FENTANYL CITRATE (PF) 100 MCG/2ML IJ SOLN
25.0000 ug | INTRAMUSCULAR | Status: DC | PRN
  Administered 2016-09-21 (×4): 25 ug via INTRAVENOUS

## 2016-09-21 MED ORDER — FENTANYL CITRATE (PF) 100 MCG/2ML IJ SOLN
INTRAMUSCULAR | Status: AC
  Administered 2016-09-21: 25 ug via INTRAVENOUS
  Filled 2016-09-21: qty 2

## 2016-09-21 MED ORDER — PHENOL 1.4 % MT LIQD
1.0000 | OROMUCOSAL | Status: DC | PRN
  Administered 2016-09-22: 1 via OROMUCOSAL
  Filled 2016-09-21: qty 177

## 2016-09-21 MED ORDER — PROPOFOL 10 MG/ML IV BOLUS
INTRAVENOUS | Status: AC
  Filled 2016-09-21: qty 20

## 2016-09-21 MED ORDER — METOCLOPRAMIDE HCL 5 MG/ML IJ SOLN: 5.0000 mg | Freq: Three times a day (TID) | INTRAMUSCULAR | Status: DC | PRN

## 2016-09-21 MED ORDER — CEFAZOLIN SODIUM-DEXTROSE 2-4 GM/100ML-% IV SOLN
2.0000 g | Freq: Four times a day (QID) | INTRAVENOUS | Status: AC
  Administered 2016-09-21 (×2): 2 g via INTRAVENOUS
  Filled 2016-09-21 (×2): qty 100

## 2016-09-21 MED ORDER — PHENYLEPHRINE HCL 10 MG/ML IJ SOLN
INTRAMUSCULAR | Status: DC | PRN
Start: 2016-09-21 — End: 2016-09-21
  Administered 2016-09-21 (×4): 80 ug via INTRAVENOUS

## 2016-09-21 MED ORDER — SODIUM CHLORIDE 0.9 % IV SOLN: 10.0000 mL/h | Freq: Once | INTRAVENOUS | Status: DC

## 2016-09-21 MED ORDER — MIDAZOLAM HCL 2 MG/2ML IJ SOLN
INTRAMUSCULAR | Status: AC
  Filled 2016-09-21: qty 2

## 2016-09-21 MED ORDER — METOCLOPRAMIDE HCL 5 MG PO TABS
5.0000 mg | ORAL_TABLET | Freq: Three times a day (TID) | ORAL | Status: DC | PRN
Start: 2016-09-21 — End: 2016-09-26

## 2016-09-21 MED ORDER — OXYCODONE HCL 5 MG PO TABS
5.0000 mg | ORAL_TABLET | Freq: Once | ORAL | Status: DC | PRN
Start: 2016-09-21 — End: 2016-09-21

## 2016-09-21 MED ORDER — ALBUMIN HUMAN 5 % IV SOLN
INTRAVENOUS | Status: DC | PRN
  Administered 2016-09-21: 12:00:00 via INTRAVENOUS

## 2016-09-21 MED ORDER — SODIUM CHLORIDE 0.9 % IV SOLN: Freq: Once | INTRAVENOUS | Status: DC

## 2016-09-21 MED ORDER — HYDROCODONE-ACETAMINOPHEN 5-325 MG PO TABS
2.0000 | ORAL_TABLET | ORAL | Status: DC | PRN
  Administered 2016-09-21 – 2016-09-26 (×15): 2 via ORAL
  Filled 2016-09-21 (×16): qty 2

## 2016-09-21 MED ORDER — CEFAZOLIN SODIUM-DEXTROSE 2-4 GM/100ML-% IV SOLN
INTRAVENOUS | Status: AC
  Filled 2016-09-21: qty 100

## 2016-09-21 MED ORDER — PROPOFOL 10 MG/ML IV BOLUS
INTRAVENOUS | Status: DC | PRN
Start: 2016-09-21 — End: 2016-09-21
  Administered 2016-09-21: 80 mg via INTRAVENOUS

## 2016-09-21 MED ORDER — MENTHOL 3 MG MT LOZG: 1.0000 | LOZENGE | OROMUCOSAL | Status: DC | PRN

## 2016-09-21 MED ORDER — ACETAMINOPHEN 650 MG RE SUPP: 650.0000 mg | Freq: Four times a day (QID) | RECTAL | Status: DC | PRN

## 2016-09-21 MED ORDER — SUGAMMADEX SODIUM 200 MG/2ML IV SOLN
INTRAVENOUS | Status: DC | PRN
  Administered 2016-09-21: 200 mg via INTRAVENOUS

## 2016-09-21 MED ORDER — ONDANSETRON HCL 4 MG/2ML IJ SOLN
INTRAMUSCULAR | Status: DC | PRN
  Administered 2016-09-21: 4 mg via INTRAVENOUS

## 2016-09-21 MED ORDER — APIXABAN 2.5 MG PO TABS
2.5000 mg | ORAL_TABLET | Freq: Two times a day (BID) | ORAL | Status: DC
  Administered 2016-09-22 – 2016-09-25 (×7): 2.5 mg via ORAL
  Filled 2016-09-21 (×7): qty 1

## 2016-09-21 MED ORDER — ACETAMINOPHEN 325 MG PO TABS: 650.0000 mg | ORAL_TABLET | Freq: Four times a day (QID) | ORAL | Status: DC | PRN

## 2016-09-21 MED ORDER — MIDAZOLAM HCL 5 MG/5ML IJ SOLN
INTRAMUSCULAR | Status: DC | PRN
  Administered 2016-09-21: 2 mg via INTRAVENOUS

## 2016-09-21 MED ORDER — LACTATED RINGERS IV SOLN
INTRAVENOUS | Status: DC | PRN
  Administered 2016-09-21: 11:00:00 via INTRAVENOUS

## 2016-09-21 MED ORDER — FENTANYL CITRATE (PF) 100 MCG/2ML IJ SOLN
INTRAMUSCULAR | Status: AC
  Filled 2016-09-21: qty 2

## 2016-09-21 MED ORDER — CEFAZOLIN SODIUM-DEXTROSE 2-4 GM/100ML-% IV SOLN
2.0000 g | Freq: Once | INTRAVENOUS | Status: AC
  Administered 2016-09-21: 2 g via INTRAVENOUS

## 2016-09-21 MED ORDER — LIDOCAINE HCL (CARDIAC) 20 MG/ML IV SOLN
INTRAVENOUS | Status: DC | PRN
  Administered 2016-09-21: 40 mg via INTRAVENOUS

## 2016-09-21 MED ORDER — METHOCARBAMOL 1000 MG/10ML IJ SOLN
500.0000 mg | Freq: Four times a day (QID) | INTRAVENOUS | Status: DC | PRN
  Filled 2016-09-21: qty 5

## 2016-09-21 MED ORDER — 0.9 % SODIUM CHLORIDE (POUR BTL) OPTIME
TOPICAL | Status: DC | PRN
  Administered 2016-09-21: 1000 mL

## 2016-09-21 MED ORDER — ONDANSETRON HCL 4 MG/2ML IJ SOLN: 4.0000 mg | Freq: Four times a day (QID) | INTRAMUSCULAR | Status: DC | PRN

## 2016-09-21 SURGICAL SUPPLY — 44 items
BIT DRILL 4.3MMS DISTAL GRDTED (BIT) IMPLANT
CHLORAPREP W/TINT 26ML (MISCELLANEOUS) ×2 IMPLANT
COVER PERINEAL POST (MISCELLANEOUS) ×3 IMPLANT
DRAPE PROXIMA HALF (DRAPES) IMPLANT
DRAPE STERI IOBAN 125X83 (DRAPES) ×3 IMPLANT
DRAPE TABLE COVER HEAVY DUTY (DRAPES) ×3 IMPLANT
DRILL 4.3MMS DISTAL GRADUATED (BIT) ×3
DRSG ADAPTIC 3X8 NADH LF (GAUZE/BANDAGES/DRESSINGS) ×3 IMPLANT
DRSG MEPILEX BORDER 4X4 (GAUZE/BANDAGES/DRESSINGS) ×3 IMPLANT
DRSG MEPILEX BORDER 4X8 (GAUZE/BANDAGES/DRESSINGS) ×3 IMPLANT
DURAPREP 26ML APPLICATOR (WOUND CARE) ×1 IMPLANT
ELECT CAUTERY BLADE 6.4 (BLADE) ×3 IMPLANT
ELECT REM PT RETURN 9FT ADLT (ELECTROSURGICAL) ×3
ELECTRODE REM PT RTRN 9FT ADLT (ELECTROSURGICAL) ×1 IMPLANT
EVACUATOR 1/8 PVC DRAIN (DRAIN) IMPLANT
GLOVE BIO SURGEON STRL SZ8 (GLOVE) ×12 IMPLANT
GLOVE BIOGEL PI IND STRL 8 (GLOVE) ×1 IMPLANT
GLOVE BIOGEL PI INDICATOR 8 (GLOVE) ×2
GOWN STRL REUS W/ TWL LRG LVL3 (GOWN DISPOSABLE) ×2 IMPLANT
GOWN STRL REUS W/ TWL XL LVL3 (GOWN DISPOSABLE) ×3 IMPLANT
GOWN STRL REUS W/TWL 2XL LVL3 (GOWN DISPOSABLE) ×3 IMPLANT
GOWN STRL REUS W/TWL LRG LVL3 (GOWN DISPOSABLE) ×6
GOWN STRL REUS W/TWL XL LVL3 (GOWN DISPOSABLE) ×9
GUIDEWIRE BALL NOSE 100CM (WIRE) ×2 IMPLANT
HFN RH 130 DEG 9MM X 340MM (Nail) ×2 IMPLANT
HIP FR NAIL LAG SCREW 10.5X110 (Orthopedic Implant) ×3 IMPLANT
KIT BASIN OR (CUSTOM PROCEDURE TRAY) ×3 IMPLANT
KIT ROOM TURNOVER OR (KITS) ×3 IMPLANT
LINER BOOT UNIVERSAL DISP (MISCELLANEOUS) ×3 IMPLANT
MANIFOLD NEPTUNE II (INSTRUMENTS) ×3 IMPLANT
NS IRRIG 1000ML POUR BTL (IV SOLUTION) ×3 IMPLANT
PACK GENERAL/GYN (CUSTOM PROCEDURE TRAY) ×3 IMPLANT
PAD ARMBOARD 7.5X6 YLW CONV (MISCELLANEOUS) ×6 IMPLANT
PIN GUIDE 3.2 903003004 (MISCELLANEOUS) ×2 IMPLANT
SCREW LAG HIP FR NAIL 10.5X110 (Orthopedic Implant) IMPLANT
STAPLER VISISTAT 35W (STAPLE) ×3 IMPLANT
SUT VIC AB 0 CT1 27 (SUTURE) ×6
SUT VIC AB 0 CT1 27XBRD ANBCTR (SUTURE) ×2 IMPLANT
SUT VIC AB 1 CTB1 27 (SUTURE) ×6 IMPLANT
SUT VIC AB 2-0 CT1 27 (SUTURE) ×6
SUT VIC AB 2-0 CT1 TAPERPNT 27 (SUTURE) ×2 IMPLANT
TOWEL OR 17X24 6PK STRL BLUE (TOWEL DISPOSABLE) ×3 IMPLANT
TOWEL OR 17X26 10 PK STRL BLUE (TOWEL DISPOSABLE) ×3 IMPLANT
WATER STERILE IRR 1000ML POUR (IV SOLUTION) ×3 IMPLANT

## 2016-09-21 NOTE — Anesthesia Procedure Notes (Signed)
Procedure Name: Intubation Date/Time: 09/21/2016 11:09 AM Performed by: Shirlyn Goltz Pre-anesthesia Checklist: Patient identified, Emergency Drugs available, Suction available and Patient being monitored Patient Re-evaluated:Patient Re-evaluated prior to inductionOxygen Delivery Method: Circle system utilized Preoxygenation: Pre-oxygenation with 100% oxygen Intubation Type: IV induction Ventilation: Mask ventilation without difficulty Laryngoscope Size: Mac and 3 Grade View: Grade I Tube type: Oral Tube size: 7.0 mm Number of attempts: 1 Airway Equipment and Method: Stylet Placement Confirmation: ETT inserted through vocal cords under direct vision,  positive ETCO2 and breath sounds checked- equal and bilateral Secured at: 21 cm Tube secured with: Tape Dental Injury: Teeth and Oropharynx as per pre-operative assessment

## 2016-09-21 NOTE — Op Note (Deleted)
  The note originally documented on this encounter has been moved the the encounter in which it belongs.  

## 2016-09-21 NOTE — Progress Notes (Signed)
PROGRESS NOTE    Tracy Dodson  CBU:[id number] DOB: [birth year] DOA: 09/20/2016  PCP: [name], MD   Brief Narrative:  Tracy Dodson is a 65 y.o. 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 She had a CT done as outpt recently (not available in EPIC yet) and was called by her radiation oncologist while in the ER and told that she may have a hairline fracture in the right hip based on that CT. Bone scan on 12/14 which I am able to review in EPIC also showed increased activity in the right hip.   Subjective: Just came out of surgery. She has no complaints.   Assessment & Plan:   Principal Problem:   Intertrochanteric fracture of right hip - s/p repair today- management per ortho - has been started on low dose Eliquis by ortho  Active Problems: Non-small cell lung cancer recurrent with brain metastasis (resected) and bone metastasis  - on Tagrisso per Dr Julien Nordmann - T10 met vs vertebral compression fx on bone scan - right hip met vs fracture (need to review recent CT) but uptake on bone scan can be also due to a fracture- xrays show not metastasis - Dr Sondra Come was considering palliative radiation to right hip and he suspected T10 lesion to be benign- he planned on consult for kyphoplasty - cont Prednisone  Sinus tachycardia - persistent -currently has no pain or fever to be contributing to this - does not appear dehydrated - follow  Anemia/ thrombocytopenia - appears to be chronic    DVT prophylaxis: Eliquis Code Status: Full code Family Communication: husband Disposition Plan: likely SNF- social work consulted Consultants:   ortho Procedures:   Right hip repair- op note not available at this time   Antimicrobials:  Anti-infectives    Start     Dose/Rate Route Frequency Ordered Stop     09/21/16 1700  ceFAZolin (ANCEF) IVPB 2g/100 mL premix     2 g 200 mL/hr over 30 Minutes Intravenous Every 6 hours 09/21/16 1501 09/22/16 0459   09/21/16 1100  ceFAZolin (ANCEF) IVPB 2g/100 mL premix     2 g 200 mL/hr over 30 Minutes Intravenous  Once 09/21/16 1047 09/21/16 1141   09/21/16 1048  ceFAZolin (ANCEF) 2-4 GM/100ML-% IVPB    Comments:  Henrine Screws   : cabinet override      09/21/16 1048 09/21/16 1111       Objective: Vitals:   09/21/16 1415 09/21/16 1425 09/21/16 1426 09/21/16 1430  BP:  (!) 118/51    Pulse: (!) 138  (!) 136 (!) 137  Resp: 18  20 (!) 21  Temp:    98.1 F (36.7 C)  TempSrc:      SpO2: 99%  100% 94%  Weight:      Height:        Intake/Output Summary (Last 24 hours) at 09/21/16 1536 Last data filed at 09/21/16 1230  Gross per 24 hour  Intake              550 ml  Output              400 ml  Net              150 ml   Filed Weights   09/20/16 0830  Weight: 60.3 kg (133 lb)  Examination: General exam: Appears comfortable  HEENT: PERRLA, oral mucosa moist, no sclera icterus or thrush Respiratory system: Clear to auscultation. Respiratory effort normal. Cardiovascular system: S1 & S2 heard, RRR.  No murmurs - tachycardic Gastrointestinal system: Abdomen soft, non-tender, nondistended. Normal bowel sound. No organomegaly Central nervous system: Alert and oriented. No focal neurological deficits. Extremities: No cyanosis, clubbing or edema Skin: No rashes or ulcers Psychiatry:  Mood & affect appropriate.     Data Reviewed: I have personally reviewed following labs and imaging studies  CBC:  Recent Labs Lab 09/20/16 0822 09/21/16 0558 09/21/16 0940  WBC 9.1 8.2 8.2  NEUTROABS 4.6  --   --   HGB 9.5* 7.8* 8.2*  HCT 30.8* 27.6* 27.9*  MCV 86.5 89.9 91.5  PLT 76* 65* 64*   Basic Metabolic Panel:  Recent Labs Lab 09/20/16 0822 09/21/16 0558  NA 137 138  K 3.4* 4.3  CL 101 105  CO2 24 27  GLUCOSE 142* 137*  BUN 7 6   CREATININE 0.88 0.74  CALCIUM 9.4 8.7*   GFR: Estimated Creatinine Clearance: 64.8 mL/min (by C-G formula based on SCr of 0.74 mg/dL). Liver Function Tests: No results for input(s): AST, ALT, ALKPHOS, BILITOT, PROT, ALBUMIN in the last 168 hours. No results for input(s): LIPASE, AMYLASE in the last 168 hours. No results for input(s): AMMONIA in the last 168 hours. Coagulation Profile:  Recent Labs Lab 09/20/16 0822  INR 1.16   Cardiac Enzymes: No results for input(s): CKTOTAL, CKMB, CKMBINDEX, TROPONINI in the last 168 hours. BNP (last 3 results) No results for input(s): PROBNP in the last 8760 hours. HbA1C: No results for input(s): HGBA1C in the last 72 hours. CBG: No results for input(s): GLUCAP in the last 168 hours. Lipid Profile: No results for input(s): CHOL, HDL, LDLCALC, TRIG, CHOLHDL, LDLDIRECT in the last 72 hours. Thyroid Function Tests: No results for input(s): TSH, T4TOTAL, FREET4, T3FREE, THYROIDAB in the last 72 hours. Anemia Panel: No results for input(s): VITAMINB12, FOLATE, FERRITIN, TIBC, IRON, RETICCTPCT in the last 72 hours. Urine analysis:    Component Value Date/Time   COLORURINE YELLOW 02/04/2013 [place] 02/04/2013 1033   LABSPEC 1.007 02/04/2013 1033   PHURINE 7.0 02/04/2013 1033   GLUCOSEU NEGATIVE 02/04/2013 1033   HGBUR NEGATIVE 02/04/2013 1033   BILIRUBINUR NEGATIVE 02/04/2013 1033   KETONESUR NEGATIVE 02/04/2013 1033   PROTEINUR NEGATIVE 02/04/2013 1033   UROBILINOGEN 0.2 02/04/2013 1033   NITRITE NEGATIVE 02/04/2013 1033   LEUKOCYTESUR TRACE (A) 02/04/2013 1033   Sepsis Labs: _0 (procalcitonin:4,lacticidven:4) ) Recent Results (from the past 240 hour(s))  Surgical PCR screen     Status: None   Collection Time: 09/21/16  1:12 AM  Result Value Ref Range Status   MRSA, PCR NEGATIVE NEGATIVE Final   Staphylococcus aureus NEGATIVE NEGATIVE Final    Comment:        The Xpert SA Assay (FDA approved for NASAL  specimens in patients over 42 years of age), is one component of a comprehensive surveillance program.  Test performance has been validated by [facility] for patients greater than or equal to 4 year old. It is not intended to diagnose infection nor to guide or monitor treatment.          Radiology Studies: Dg Chest 1 View  Result Date: 09/20/2016 CLINICAL DATA:  65 year old female with history of trauma from a fall at home. History of lung cancer. EXAM: CHEST 1 VIEW COMPARISON:  Chest x-ray 08/30/2016. FINDINGS: Known  left upper lobe and right lower lobe nodules are not well demonstrated on today's chest radiograph, which could indicate positive response to therapy, or could partially be related to decreased sensitivity of chest radiographs. No acute consolidative airspace disease. No pleural effusions. Chronic scarring in the right lung base is unchanged. No pneumothorax. No evidence of pulmonary edema. Heart size is normal. The patient is rotated to the left on today's exam, resulting in distortion of the mediastinal contours and reduced diagnostic sensitivity and specificity for mediastinal pathology. Visualized bony thorax appears grossly intact. Right internal jugular single-lumen power porta cath with tip terminating in the distal superior vena cava. IMPRESSION: 1. No evidence of significant acute traumatic injury to the thorax. 2. Known left upper lobe and right lower lobe pulmonary nodules are poorly demonstrated on today's chest radiograph. Electronically Signed   By: Vinnie Langton M.D.   On: 09/20/2016 08:57   Dg C-arm 1-60 Min  Result Date: 09/21/2016 CLINICAL DATA:  Intramedullary nail placement. EXAM: DG C-ARM 61-120 MIN COMPARISON:  None. FINDINGS: Fluoroscopic images demonstrate placement of intramedullary right femoral nail, affixing intertrochanteric right proximal femoral fracture. The alignment is near anatomic. No evidence of immediate complications. Fluoroscopy  time is recorded as 1 minutes 48 seconds. IMPRESSION: Intramedullary nail fixation of intertrochanteric right proximal femoral fracture, without evidence of immediate complications. Electronically Signed   By: Fidela Salisbury M.D.   On: 09/21/2016 13:38   Dg Hip Operative Unilat W Or W/o Pelvis Right  Result Date: 09/21/2016 CLINICAL DATA:  Fixation right hip fracture. EXAM: OPERATIVE right HIP (WITH PELVIS IF PERFORMED) 4 VIEWS TECHNIQUE: Fluoroscopic spot image(s) were submitted for interpretation post-operatively. COMPARISON:  09/20/2016 FINDINGS: Examination demonstrates placement of a right femoral intramedullary nail with associated compression screw bridging patient's trochanteric fracture into the femoral head. Hardware is intact as there is near anatomic alignment over the fracture site. IMPRESSION: Post fixation of right intertrochanteric fracture with hardware intact and near anatomic alignment over the fracture site. Electronically Signed   By: Marin Olp M.D.   On: 09/21/2016 13:38   Dg Hip Unilat  With Pelvis 2-3 Views Right  Result Date: 09/20/2016 CLINICAL DATA:  Fall at home in shower.  Initial encounter. EXAM: DG HIP (WITH OR WITHOUT PELVIS) 2-3V RIGHT COMPARISON:  None. FINDINGS: Acute intertrochanteric right femur fracture with varus angulation from prominent displacement. The hips are located. No evidence of pelvic ring fracture or diastasis. IMPRESSION: Displaced intertrochanteric right femur fracture. Electronically Signed   By: Monte Fantasia M.D.   On: 09/20/2016 08:53      Scheduled Meds: . [START ON 09/22/2016] apixaban  2.5 mg Oral BID  .  ceFAZolin (ANCEF) IV  2 g Intravenous Q6H  . cholecalciferol  2,000 Units Oral Daily  . citalopram  20 mg Oral Daily  . famotidine (PEPCID) IV  20 mg Intravenous Q12H  . feeding supplement (ENSURE ENLIVE)  237 mL Oral BID BM  . feeding supplement (PRO-STAT SUGAR FREE 64)  30 mL Oral Daily  . [START ON 09/22/2016]  multivitamin with minerals  1 tablet Oral Daily  . osimertinib mesylate  80 mg Oral Daily  . oxyCODONE  20 mg Oral Q12H  . predniSONE  2 mg Oral Q supper  . predniSONE  3 mg Oral Q breakfast   Continuous Infusions: . dextrose 5 % and 0.45 % NaCl with KCl 20 mEq/L 75 mL/hr at 09/20/16 2224     LOS: 1 day    Time spent in minutes: 35  Debbe Odea, MD [facility] Pager: [email or web address] Password [facility] 09/21/2016, 3:36 PM

## 2016-09-21 NOTE — Interval H&P Note (Signed)
History and Physical Interval Note:  09/21/2016 10:17 AM  Tracy Dodson  has presented today for surgery, with the diagnosis of Right Hip fracture  The various methods of treatment have been discussed with the patient and family. After consideration of risks, benefits and other options for treatment, the patient has consented to  Procedure(s): INTRAMEDULLARY (IM) NAIL INTERTROCHANTRIC (Right) as a surgical intervention .  The patient's history has been reviewed, patient examined, no change in status, stable for surgery.  I have reviewed the patient's chart and labs.  Questions were answered to the patient's satisfaction.     Lucinda Spells G

## 2016-09-21 NOTE — Anesthesia Postprocedure Evaluation (Signed)
Anesthesia Post Note  Patient: Tracy Dodson  Procedure(s) Performed: Procedure(s) (LRB): INTRAMEDULLARY (IM) NAIL INTERTROCHANTRIC (Right)  Patient location during evaluation: PACU Anesthesia Type: General Level of consciousness: awake and alert Pain management: pain level controlled Vital Signs Assessment: post-procedure vital signs reviewed and stable Respiratory status: spontaneous breathing, nonlabored ventilation, respiratory function stable and patient connected to nasal cannula oxygen Cardiovascular status: stable and tachycardic Postop Assessment: no signs of nausea or vomiting Anesthetic complications: no       Last Vitals:  Vitals:   09/21/16 1426 09/21/16 1430  BP:    Pulse: (!) 136 (!) 137  Resp: 20 (!) 21  Temp:  36.7 C    Last Pain:  Vitals:   09/21/16 1430  TempSrc:   PainSc: 2                  Jermy Couper

## 2016-09-21 NOTE — Anesthesia Preprocedure Evaluation (Signed)
Anesthesia Evaluation  Patient identified by MRN, date of birth, ID band  Reviewed: Allergy & Precautions, NPO status , Patient's Chart, lab work & pertinent test results  Airway Mallampati: I  TM Distance: >3 FB Neck ROM: Full    Dental  (+) Teeth Intact   Pulmonary former smoker,    breath sounds clear to auscultation       Cardiovascular negative cardio ROS   Rhythm:Regular     Neuro/Psych  Headaches, Anxiety Depression  Neuromuscular disease    GI/Hepatic hiatal hernia,   Endo/Other    Renal/GU      Musculoskeletal  (+) Arthritis ,   Abdominal   Peds  Hematology  (+) anemia , Thrombocytopenia    Anesthesia Other Findings   Reproductive/Obstetrics                             Anesthesia Physical Anesthesia Plan  ASA: III  Anesthesia Plan: General   Post-op Pain Management:    Induction: Intravenous  Airway Management Planned: Oral ETT  Additional Equipment: None  Intra-op Plan:   Post-operative Plan: Extubation in OR  Informed Consent: I have reviewed the patients History and Physical, chart, labs and discussed the procedure including the risks, benefits and alternatives for the proposed anesthesia with the patient or authorized representative who has indicated his/her understanding and acceptance.   Dental advisory given  Plan Discussed with: CRNA and Surgeon  Anesthesia Plan Comments:         Anesthesia Quick Evaluation

## 2016-09-21 NOTE — Transfer of Care (Signed)
Immediate Anesthesia Transfer of Care Note  Patient: Tracy Dodson  Procedure(s) Performed: Procedure(s): INTRAMEDULLARY (IM) NAIL INTERTROCHANTRIC (Right)  Patient Location: PACU  Anesthesia Type:General  Level of Consciousness: awake, oriented, sedated, patient cooperative and responds to stimulation  Airway & Oxygen Therapy: Patient Spontanous Breathing and Patient connected to nasal cannula oxygen  Post-op Assessment: Report given to RN, Post -op Vital signs reviewed and stable, Patient moving all extremities and Patient moving all extremities X 4  Post vital signs: Reviewed and stable  Last Vitals:  Vitals:   09/21/16 1012 09/21/16 1241  BP: (!) 119/54 (!) 144/77  Pulse: (!) 118 (!) 139  Resp: 20 (!) 24  Temp: 37.1 C 36.6 C    Last Pain:  Vitals:   09/21/16 1012  TempSrc: Oral  PainSc:       Patients Stated Pain Goal: 0 (05/39/76 7341)  Complications: No apparent anesthesia complications

## 2016-09-21 NOTE — Op Note (Signed)
NAMEREATHER, STELLER                ACCOUNT NO.:  [id number]  MEDICAL RECORD NO.:  [id number]  LOCATION:                                 FACILITY:  PHYSICIAN:  Monico Blitz. Makinsley Schiavi, M.D.DATE OF BIRTH:  [birth year]  DATE OF PROCEDURE:  09/21/2016 DATE OF DISCHARGE:  09/26/2016                              OPERATIVE REPORT   PREOPERATIVE DIAGNOSIS:  Right hip intertrochanteric fracture.  POSTOPERATIVE DIAGNOSIS:  Right hip intertrochanteric fracture.  PROCEDURE:  Right hip open reduction internal fixation with trochanteric nail.  ANESTHESIA:  General.  ATTENDING SURGEON:  Monico Blitz. Rhona Raider, M.D.  ASSISTANT:  Loni Dolly, P.A.  INDICATION FOR PROCEDURE:  The patient is a 65 year old woman who has a history of metastatic cancer.  She was scheduled to undergo some radiation treatments to a portion of her femur, but unfortunately fell yesterday and fractured through this area.  She had a widely displaced high intertrochanteric fracture and is offered ORIF in hopes of stabilizing this bone.  Informed operative consent was obtained after discussion of possible complications including reaction to anesthesia, infection, nonunion.  SUMMARY OF FINDINGS AND PROCEDURE:  Under general anesthesia, through some small incisions, her right hip was reduced under fluoroscopic guidance and then stabilized with a [employer] AFFIXUS nail.  This was a 9 x 340 device and stabilized proximally with a single large locking screw. We elected not to lock distally.  I used fluoroscopy throughout the case to make appropriate intraoperative decisions and read all of these views myself.  Loni Dolly assisted throughout and was invaluable to the completion of the case mostly and that he passed instruments and maintained exposure while I performed the operation.  DESCRIPTION OF PROCEDURE:  The patient was taken to the operating suite where general anesthetic was applied without difficulty.  She was positioned on the  fracture table and prepped and draped in normal sterile fashion.  After the administration of IV Kefzol and an appropriate time-out, the leg was reduced under fluoroscopic guidance with some traction and adduction.  We then made a small incision proximal to greater trochanter and placed a guidewire through the greater trochanter down the intramedullary canal.  We then placed a starter reamer down to the level of the lesser trochanter.  I then placed a 2nd guidewire down through the bone, seemed to be intramedullary on 2 views fluoroscopically.  I then over reamed for a fairly tight canal from 9 to 10.5 mm.  We then passed the aforementioned AFFIXUS nail and seated it appropriately.  With our reduction maintained, I placed a guidewire up into the femoral head through the nail utilizing the proximal guide and reamed over this.  This was seen to be central in the femoral head on 2 views.  I then placed a locking screw which was 110 mm in length.  We then compressed the fracture site and brought the intertrochanteric displacement completely back to bone- to-bone.  I did place a derotation guidewire during reaming and placement of the screw.  Distally, initially thought I might lock, but then realized there really was very little comminution at the actual fracture site.  She also had a very tight canal and elected  in the antilock.  We obtained fluoroscopic views in 2 planes both proximal and distal.  I read these views myself.  We then irrigated all the wounds followed by reapproximation of deep tissues with Vicryl and skin with staples.  Adaptic was applied followed by dry gauze and tape.  Estimated blood loss and fluids obtained from anesthesia records.  DISPOSITION:  The patient was taken to recovery room in stable condition and scheduled to be admitted back to the medicine service for appropriate postop care.  She will be partial weightbearing for about a month.     Monico Blitz  Rhona Raider, M.D.   ______________________________ Monico Blitz. Rhona Raider, M.D.    PGD/MEDQ  D:  09/21/2016  T:  09/21/2016  Job:  [id number]

## 2016-09-21 NOTE — Op Note (Signed)
#  [id number] 
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
## 2016-09-22 DIAGNOSIS — D62 Acute posthemorrhagic anemia: Secondary | ICD-10-CM

## 2016-09-22 LAB — IRON AND TIBC
IRON: 9 ug/dL — AB (ref 28–170)
SATURATION RATIOS: 5 % — AB (ref 10.4–31.8)
TIBC: 172 ug/dL — AB (ref 250–450)
UIBC: 163 ug/dL

## 2016-09-22 LAB — RETICULOCYTES
RBC.: 2.4 MIL/uL — ABNORMAL LOW (ref 3.87–5.11)
Retic Count, Absolute: 112.8 10*3/uL (ref 19.0–186.0)
Retic Ct Pct: 4.7 % — ABNORMAL HIGH (ref 0.4–3.1)

## 2016-09-22 LAB — BASIC METABOLIC PANEL
Anion gap: 7 (ref 5–15)
BUN: 7 mg/dL (ref 6–20)
CALCIUM: 8.5 mg/dL — AB (ref 8.9–10.3)
CHLORIDE: 102 mmol/L (ref 101–111)
CO2: 26 mmol/L (ref 22–32)
CREATININE: 0.61 mg/dL (ref 0.44–1.00)
GFR calc Af Amer: >60 mL/min (ref >60)
GFR calc non Af Amer: >60 mL/min (ref >60)
GLUCOSE: 110 mg/dL — AB (ref 65–99)
Potassium: 4 mmol/L (ref 3.5–5.1)
Sodium: 135 mmol/L (ref 135–145)

## 2016-09-22 LAB — CBC
HEMATOCRIT: 20.7 % — AB (ref 36.0–46.0)
HEMOGLOBIN: 6.1 g/dL — AB (ref 12.0–15.0)
MCH: 26.3 pg (ref 26.0–34.0)
MCHC: 29.5 g/dL — AB (ref 30.0–36.0)
MCV: 89.2 fL (ref 78.0–100.0)
Platelets: 52 10*3/uL — ABNORMAL LOW (ref 150–400)
RBC: 2.32 MIL/uL — ABNORMAL LOW (ref 3.87–5.11)
RDW: 20.1 % — AB (ref 11.5–15.5)
WBC: 8.9 10*3/uL (ref 4.0–10.5)

## 2016-09-22 LAB — VITAMIN B12: Vitamin B-12: 596 pg/mL (ref 180–914)

## 2016-09-22 LAB — FOLATE: Folate: 16.3 ng/mL (ref >5.9)

## 2016-09-22 LAB — FERRITIN: FERRITIN: 245 ng/mL (ref 11–307)

## 2016-09-22 LAB — PREPARE RBC (CROSSMATCH)

## 2016-09-22 MED ORDER — SODIUM CHLORIDE 0.9 % IV SOLN
Freq: Once | INTRAVENOUS | Status: AC
  Administered 2016-09-22: 08:00:00 via INTRAVENOUS

## 2016-09-22 NOTE — Progress Notes (Signed)
CRITICAL VALUE ALERT  Critical value received: hemoglobin 6.1  Date of notification:  09/22/16  Time of notification:  7:34am  Critical value read back:Yes.    Nurse who received alert:  Darrol Jump  MD notified (1st page):  Debbe Odea  Time of first page:  7:36am  MD notified (2nd page):  Time of second page:  Responding MD:  Debbe Odea  Time MD responded:  7:38am

## 2016-09-22 NOTE — Discharge Instructions (Signed)
Information on my medicine - ELIQUIS (apixaban)  This medication education was reviewed with me or my healthcare representative as part of my discharge preparation.  The pharmacist that spoke with me during my hospital stay was:  Brain Hilts, [facility]  Why was Eliquis prescribed for you? Eliquis was prescribed for you to reduce the risk of blood clots forming after orthopedic surgery.    What do You need to know about Eliquis? Take your Eliquis TWICE DAILY - one tablet in the morning and one tablet in the evening with or without food.  It would be best to take the dose about the same time each day.  If you have difficulty swallowing the tablet whole please discuss with your pharmacist how to take the medication safely.  Take Eliquis exactly as prescribed by your doctor and DO NOT stop taking Eliquis without talking to the doctor who prescribed the medication.  Stopping without other medication to take the place of Eliquis may increase your risk of developing a clot.  After discharge, you should have regular check-up appointments with your healthcare provider that is prescribing your Eliquis.  What do you do if you miss a dose? If a dose of ELIQUIS is not taken at the scheduled time, take it as soon as possible on the same day and twice-daily administration should be resumed.  The dose should not be doubled to make up for a missed dose.  Do not take more than one tablet of ELIQUIS at the same time.  Important Safety Information A possible side effect of Eliquis is bleeding. You should call your healthcare provider right away if you experience any of the following: ? Bleeding from an injury or your nose that does not stop. ? Unusual colored urine (red or dark brown) or unusual colored stools (red or black). ? Unusual bruising for unknown reasons. ? A serious fall or if you hit your head (even if there is no bleeding).  Some medicines may interact with Eliquis and might  increase your risk of bleeding or clotting while on Eliquis. To help avoid this, consult your healthcare provider or pharmacist prior to using any new prescription or non-prescription medications, including herbals, vitamins, non-steroidal anti-inflammatory drugs (NSAIDs) and supplements.  This website has more information on Eliquis (apixaban): http://www.eliquis.com/eliquis/home

## 2016-09-22 NOTE — Evaluation (Signed)
Occupational Therapy Evaluation Patient Details Name: Tracy Dodson MRN: [id number] DOB: Oct 09, 1950 Today's Date: 09/22/2016    History of Present Illness Tracy Dodson a 65 y.o. female with a broken hip.  She fell at home in the bathroom today and could not get up.  Also beset with recent cancer diagnosis and treatment and brain surgery earlier this year.  Pt underwent R hip  ORIF and is PWB   Clinical Impression   Pt admitted s/p fall in which pt had a broken hip. Pt had ORIF and is now PWB. Pt will need rehab post acute prior to DC home. Pt currently with functional limitations due to the deficits listed below (see OT Problem List). Pt will benefit from skilled OT to increase their safety and independence with ADL and functional mobility for ADL to facilitate discharge to venue listed below.      Follow Up Recommendations  SNF    Equipment Recommendations  None recommended by OT       Precautions / Restrictions Restrictions Weight Bearing Restrictions: Yes RLE Weight Bearing: Partial weight bearing (R femur fracture)      Mobility Bed Mobility Overal bed mobility: Needs Assistance Bed Mobility: Supine to Sit     Supine to sit: +2 for physical assistance;+2 for safety/equipment;Max assist        Transfers Overall transfer level: Needs assistance Equipment used: Rolling walker (2 wheeled) Transfers: Sit to/from [employer] Transfers Sit to Stand: Max assist;+2 physical assistance;+2 safety/equipment Stand pivot transfers: Max assist;+2 physical assistance;+2 safety/equipment                 ADL Overall ADL's : Needs assistance/impaired Eating/Feeding: Set up;Sitting   Grooming: Set up;Sitting                   Toilet Transfer: Maximal assistance;RW;BSC;Stand-pivot;Cueing for sequencing;Cueing for safety;+2 for physical assistance;+2 for safety/equipment Toilet Transfer Details (indicate cue type and reason): VC for PWB and  technique Toileting- Clothing Manipulation and Hygiene: Maximal assistance;Sit to/from stand;Cueing for safety;Cueing for sequencing;Cueing for compensatory techniques;+2 for physical assistance;+2 for safety/equipment         General ADL Comments: pt screamed in pain during transfer too and from [facility]               Pertinent Vitals/Pain Pain Assessment: 0-10 Pain Score: 7  Pain Location: R hip Pain Descriptors / Indicators: Aching;Grimacing;Sore Pain Intervention(s): Monitored during session        Extremity/Trunk Assessment Upper Extremity Assessment Upper Extremity Assessment: Generalized weakness           Communication Communication Communication: No difficulties   Cognition Arousal/Alertness: Awake/alert Behavior During Therapy: WFL for tasks assessed/performed Overall Cognitive Status: Within Functional Limits for tasks assessed                                Home Living Family/patient expects to be discharged to:: Skilled nursing facility (pt aware and agreeable to rehab as she will not be able  to return home)                                        Prior Functioning/Environment Level of Independence: Independent                 OT Problem List: Decreased strength;Decreased activity tolerance;Decreased safety awareness;Decreased  knowledge of use of DME or AE;Decreased knowledge of precautions   OT Treatment/Interventions: Self-care/ADL training;DME and/or AE instruction;Patient/family education    OT Goals(Current goals can be found in the care plan section) Acute Rehab OT Goals Patient Stated Goal: to go to rehab OT Goal Formulation: With patient Time For Goal Achievement: 09/29/16 Potential to Achieve Goals: Good ADL Goals Pt Will Perform Lower Body Dressing: with min assist;sit to/from stand Pt Will Transfer to Toilet: with min assist;ambulating;regular height toilet;bedside commode Pt Will Perform Toileting -  Clothing Manipulation and hygiene: with min assist;sit to/from stand  OT Frequency: Min 2X/week   Barriers to D/C:               End of Session Equipment Utilized During Treatment: Surveyor, mining Communication: Mobility status  Activity Tolerance: Patient tolerated treatment well Patient left: in bed;with call bell/phone within reach   Time: 1030-1101 OT Time Calculation (min): 31 min Charges:  OT General Charges $OT Visit: 1 Procedure OT Evaluation $OT Eval Moderate Complexity: 1 Procedure OT Treatments $Self Care/Home Management : 8-22 mins G-Codes:    Payton Mccallum D 2016/10/02, 11:51 AM

## 2016-09-22 NOTE — Evaluation (Signed)
Physical Therapy Evaluation Patient Details Name: Tracy Dodson MRN: [id number] DOB: 1951/01/12 Today's Date: 09/22/2016   History of Present Illness  Pt is a 65 yo female admitted through ED on 09/20/16 after a fall in her bathroom resulting in R hip intertrochanteric Fracture. Pt underwent ORIF on 09/21/16 and is PWB. PMH significant for lung cancer with recent metastasis to brain and bone and recently underwent chemo and radiation. pt is currently taking chemo PO medication.    Clinical Impression  Pt is POD 1 following the above procedure. Prior to admission, pt was completely independent and was undergoing chemotherapy. Pt presents with increased c/o pain with mobility and defers any transfers to chair or gait this session. Pt is able to perform transfers and bed mobs with Min-Mod A this session. Pt will benefit from continuing to be seen acutely in order to address the below deficits to assist with improved mobility at next venue of care. Pt will benefit the most from SNF for short term rehab.     Follow Up Recommendations SNF    Equipment Recommendations  Other (comment) (defer to next venue of care)    Recommendations for Other Services       Precautions / Restrictions Precautions Precautions: Fall Precaution Comments: s/p fall in bathroom Restrictions Weight Bearing Restrictions: Yes RLE Weight Bearing: Partial weight bearing      Mobility  Bed Mobility Overal bed mobility: Needs Assistance Bed Mobility: Supine to Sit     Supine to sit: HOB elevated;Mod assist     General bed mobility comments: Mod A to bring LE's EOB with Mod A to bring trunk upright to EOB  Transfers Overall transfer level: Needs assistance Equipment used: Rolling walker (2 wheeled) Transfers: Sit to/from Stand Sit to Stand: Min assist         General transfer comment: Min A for safety from EOB to RW  Ambulation/Gait                Stairs            Wheelchair Mobility     Modified Rankin (Stroke Patients Only)       Balance Overall balance assessment: Needs assistance Sitting-balance support: No upper extremity supported;Feet supported Sitting balance-Leahy Scale: Fair Sitting balance - Comments: Sitting EOB no back support   Standing balance support: Bilateral upper extremity supported Standing balance-Leahy Scale: Poor Standing balance comment: relies on Rw for support in standing                             Pertinent Vitals/Pain Pain Assessment: 0-10 Pain Score: 5  Pain Location: R hip Pain Descriptors / Indicators: Aching;Grimacing;Sore Pain Intervention(s): Monitored during session;Premedicated before session;Ice applied    Home Living Family/patient expects to be discharged to:: Skilled nursing facility Living Arrangements: Spouse/significant other                    Prior Function Level of Independence: Independent         Comments: was completely independent prior to admission     Hand Dominance   Dominant Hand: Right    Extremity/Trunk Assessment   Upper Extremity Assessment Upper Extremity Assessment: Defer to OT evaluation    Lower Extremity Assessment Lower Extremity Assessment: RLE deficits/detail RLE Deficits / Details: pt with normal post op pain and weakness. At least 3/5 ankle and 2/5 knee and hip per gross functional assessment    Cervical /  Trunk Assessment Cervical / Trunk Assessment: Kyphotic  Communication   Communication: No difficulties  Cognition Arousal/Alertness: Awake/alert Behavior During Therapy: WFL for tasks assessed/performed Overall Cognitive Status: Within Functional Limits for tasks assessed                      General Comments      Exercises Total Joint Exercises Ankle Circles/Pumps: AROM;Both;20 reps;Supine Quad Sets: AROM;Right;10 reps;Supine   Assessment/Plan    PT Assessment Patient needs continued PT services  PT Problem List Decreased  strength;Decreased range of motion;Decreased activity tolerance;Decreased balance;Decreased mobility;Decreased knowledge of use of DME;Pain          PT Treatment Interventions DME instruction;Gait training;Functional mobility training;Therapeutic activities;Therapeutic exercise;Balance training    PT Goals (Current goals can be found in the Care Plan section)  Acute Rehab PT Goals Patient Stated Goal: to go to rehab then home PT Goal Formulation: With patient Time For Goal Achievement: 09/29/16 Potential to Achieve Goals: Good    Frequency Min 3X/week   Barriers to discharge        Co-evaluation               End of Session Equipment Utilized During Treatment: Gait belt Activity Tolerance: Patient tolerated treatment well;Patient limited by pain Patient left: in bed;with call bell/phone within reach;with SCD's reapplied Nurse Communication: Mobility status         Time: 0093-8182 PT Time Calculation (min) (ACUTE ONLY): 31 min   Charges:   PT Evaluation $PT Eval Moderate Complexity: 1 Procedure PT Treatments $Therapeutic Activity: 8-22 mins   PT G Codes:        Scheryl Marten PT, DPT  [phone]  09/22/2016, 2:45 PM

## 2016-09-22 NOTE — Progress Notes (Signed)
PROGRESS NOTE    Tracy Dodson  ZOX:[id number] DOB: 1951-08-05 DOA: 09/20/2016  PCP: [name], MD   Brief Narrative:  Tracy Dodson is a 65 y.o. 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 She had a CT done as outpt recently (not available in EPIC yet) and was called by her radiation oncologist while in the ER and told that she may have a hairline fracture in the right hip based on that CT. Bone scan on 12/14 which I am able to review in EPIC also showed increased activity in the right hip.   Subjective: No complaints. Does not want to get out of bed with PT today.   Assessment & Plan:   Principal Problem:   Intertrochanteric fracture of right hip -  12/23-  Intramedullary nail   - Ortho instructions: Continue on Eliquis x 4 weeks for DVT prevention. Partial weightbearing right leg Follow up in office 2 weeks post op.    Active Problems: Acute blood loss anemia - due to fracture Hb 8.2 >> 6.1 - transfusing 2 U RPBC- Anemia panel shows normal ferretin, B12, Folate and elevated retic count  Non-small cell lung cancer recurrent with brain metastasis (resected) and bone metastasis  - on Tagrisso per Dr Julien Nordmann - T10 met vs vertebral compression fx on bone scan - right hip met vs fracture (need to review recent CT) but uptake on bone scan can be also due to a fracture- xrays show not metastasis - Dr Sondra Come was considering palliative radiation to right hip and he suspected T10 lesion to be benign- he planned on consult for kyphoplasty - cont Prednisone  Sinus tachycardia - persistent -currently has no pain or fever to be contributing to this - does not appear dehydrated  - does not feel any palpitations - follow  Thrombocytopenia - appears to be chronic    DVT prophylaxis:  Eliquis Code Status: Full code Family Communication: husband Disposition Plan: likely SNF- social work consulted Consultants:   ortho Procedures:   R  Intramedullary nail    Antimicrobials:  Anti-infectives    Start     Dose/Rate Route Frequency Ordered Stop   09/21/16 1700  ceFAZolin (ANCEF) IVPB 2g/100 mL premix     2 g 200 mL/hr over 30 Minutes Intravenous Every 6 hours 09/21/16 1501 09/22/16 0007   09/21/16 1100  ceFAZolin (ANCEF) IVPB 2g/100 mL premix     2 g 200 mL/hr over 30 Minutes Intravenous  Once 09/21/16 1047 09/21/16 1141   09/21/16 1048  ceFAZolin (ANCEF) 2-4 GM/100ML-% IVPB    Comments:  Henrine Screws   : cabinet override      09/21/16 1048 09/21/16 1111       Objective: Vitals:   09/22/16 0841 09/22/16 1200 09/22/16 1237 09/22/16 1252  BP: (!) 112/51 (!) 126/56 118/62 108/83  Pulse: (!) 122 (!) 131 (!) 128 (!) 129  Resp: '18 16 18 18  '$ Temp: 98.7 F (37.1 C) 99.2 F (37.3 C) 99.2 F (37.3 C) 100.3 F (37.9 C)  TempSrc: Oral Oral Oral Oral  SpO2: 97% 95% 96% 94%  Weight:      Height:        Intake/Output Summary (Last 24 hours) at 09/22/16 1340 Last data filed at 09/22/16 1237  Gross per 24 hour  Intake  3403.25 ml  Output                0 ml  Net          3403.25 ml   Filed Weights   09/20/16 0830  Weight: 60.3 kg (133 lb)    Examination: General exam: Appears comfortable  HEENT: PERRLA, oral mucosa moist, no sclera icterus or thrush Respiratory system: Clear to auscultation. Respiratory effort normal. Cardiovascular system: S1 & S2 heard, RRR.  No murmurs - tachycardic Gastrointestinal system: Abdomen soft, non-tender, nondistended. Normal bowel sound. No organomegaly Central nervous system: Alert and oriented. No focal neurological deficits. Extremities: No cyanosis, clubbing or edema Skin: No rashes or ulcers Psychiatry:  Mood & affect appropriate.     Data Reviewed: I have personally reviewed following labs and imaging  studies  CBC:  Recent Labs Lab 09/20/16 0822 09/21/16 0558 09/21/16 0940 09/22/16 0632  WBC 9.1 8.2 8.2 8.9  NEUTROABS 4.6  --   --   --   HGB 9.5* 7.8* 8.2* 6.1*  HCT 30.8* 27.6* 27.9* 20.7*  MCV 86.5 89.9 91.5 89.2  PLT 76* 65* 64* 52*   Basic Metabolic Panel:  Recent Labs Lab 09/20/16 0822 09/21/16 0558 09/22/16 0632  NA 137 138 135  K 3.4* 4.3 4.0  CL 101 105 102  CO2 '24 27 26  '$ GLUCOSE 142* 137* 110*  BUN '7 6 7  '$ CREATININE 0.88 0.74 0.61  CALCIUM 9.4 8.7* 8.5*   GFR: Estimated Creatinine Clearance: 64.8 mL/min (by C-G formula based on SCr of 0.61 mg/dL). Liver Function Tests: No results for input(s): AST, ALT, ALKPHOS, BILITOT, PROT, ALBUMIN in the last 168 hours. No results for input(s): LIPASE, AMYLASE in the last 168 hours. No results for input(s): AMMONIA in the last 168 hours. Coagulation Profile:  Recent Labs Lab 09/20/16 0822  INR 1.16   Cardiac Enzymes: No results for input(s): CKTOTAL, CKMB, CKMBINDEX, TROPONINI in the last 168 hours. BNP (last 3 results) No results for input(s): PROBNP in the last 8760 hours. HbA1C: No results for input(s): HGBA1C in the last 72 hours. CBG: No results for input(s): GLUCAP in the last 168 hours. Lipid Profile: No results for input(s): CHOL, HDL, LDLCALC, TRIG, CHOLHDL, LDLDIRECT in the last 72 hours. Thyroid Function Tests: No results for input(s): TSH, T4TOTAL, FREET4, T3FREE, THYROIDAB in the last 72 hours. Anemia Panel:  Recent Labs  09/22/16 0825  VITAMINB12 596  FOLATE 16.3  FERRITIN 245  TIBC 172*  IRON 9*  RETICCTPCT 4.7*   Urine analysis:    Component Value Date/Time   COLORURINE YELLOW 02/04/2013 [place] 02/04/2013 1033   LABSPEC 1.007 02/04/2013 1033   PHURINE 7.0 02/04/2013 1033   GLUCOSEU NEGATIVE 02/04/2013 1033   HGBUR NEGATIVE 02/04/2013 1033   BILIRUBINUR NEGATIVE 02/04/2013 1033   KETONESUR NEGATIVE 02/04/2013 1033   PROTEINUR NEGATIVE 02/04/2013 1033    UROBILINOGEN 0.2 02/04/2013 1033   NITRITE NEGATIVE 02/04/2013 1033   LEUKOCYTESUR TRACE (A) 02/04/2013 1033   Sepsis Labs: '@LABRCNTIP'$ (procalcitonin:4,lacticidven:4) ) Recent Results (from the past 240 hour(s))  Surgical PCR screen     Status: None   Collection Time: 09/21/16  1:12 AM  Result Value Ref Range Status   MRSA, PCR NEGATIVE NEGATIVE Final   Staphylococcus aureus NEGATIVE NEGATIVE Final    Comment:        The Xpert SA Assay (FDA approved for NASAL specimens in patients over 32 years of age), is one component of a comprehensive  surveillance program.  Test performance has been validated by [facility] for patients greater than or equal to 66 year old. It is not intended to diagnose infection nor to guide or monitor treatment.          Radiology Studies: Dg C-arm 1-60 Min  Result Date: 09/21/2016 CLINICAL DATA:  Intramedullary nail placement. EXAM: DG C-ARM 61-120 MIN COMPARISON:  None. FINDINGS: Fluoroscopic images demonstrate placement of intramedullary right femoral nail, affixing intertrochanteric right proximal femoral fracture. The alignment is near anatomic. No evidence of immediate complications. Fluoroscopy time is recorded as 1 minutes 48 seconds. IMPRESSION: Intramedullary nail fixation of intertrochanteric right proximal femoral fracture, without evidence of immediate complications. Electronically Signed   By: Fidela Salisbury M.D.   On: 09/21/2016 13:38   Dg Hip Operative Unilat W Or W/o Pelvis Right  Result Date: 09/21/2016 CLINICAL DATA:  Fixation right hip fracture. EXAM: OPERATIVE right HIP (WITH PELVIS IF PERFORMED) 4 VIEWS TECHNIQUE: Fluoroscopic spot image(s) were submitted for interpretation post-operatively. COMPARISON:  09/20/2016 FINDINGS: Examination demonstrates placement of a right femoral intramedullary nail with associated compression screw bridging patient's trochanteric fracture into the femoral head. Hardware is intact as there is  near anatomic alignment over the fracture site. IMPRESSION: Post fixation of right intertrochanteric fracture with hardware intact and near anatomic alignment over the fracture site. Electronically Signed   By: Marin Olp M.D.   On: 09/21/2016 13:38      Scheduled Meds: . sodium chloride   Intravenous Once  . apixaban  2.5 mg Oral BID  . cholecalciferol  2,000 Units Oral Daily  . citalopram  20 mg Oral Daily  . famotidine (PEPCID) IV  20 mg Intravenous Q12H  . feeding supplement (ENSURE ENLIVE)  237 mL Oral BID BM  . feeding supplement (PRO-STAT SUGAR FREE 64)  30 mL Oral Daily  . multivitamin with minerals  1 tablet Oral Daily  . osimertinib mesylate  80 mg Oral Daily  . oxyCODONE  20 mg Oral Q12H  . predniSONE  2 mg Oral Q supper  . predniSONE  3 mg Oral Q breakfast   Continuous Infusions: . dextrose 5 % and 0.45 % NaCl with KCl 20 mEq/L 75 mL/hr at 09/20/16 2224     LOS: 2 days    Time spent in minutes: 21    [place], MD [facility] Pager: [email or web address] Password [email or web address] 09/22/2016, 1:40 PM

## 2016-09-22 NOTE — Progress Notes (Addendum)
Subjective: 1 Day Post-Op Procedure(s) (LRB): INTRAMEDULLARY (IM) NAIL INTERTROCHANTRIC (Right)   Patient is resting comfortably in bed this morning with family at bed side. Her Hgb was 6.1 this morning so medicine team was called and blood was ordered. She currently has no complaints.  Activity level:  Partial weightbearing right leg Diet tolerance:  ok Voiding:  ok Patient reports pain as moderate.    Objective: Vital signs in last 24 hours: Temp:  [97.9 F (36.6 C)-99.2 F (37.3 C)] 98.7 F (37.1 C) (12/24 0841) Pulse Rate:  [116-148] 122 (12/24 0841) Resp:  [14-30] 18 (12/24 0841) BP: (108-144)/(51-77) 112/51 (12/24 0841) SpO2:  [93 %-100 %] 97 % (12/24 0841)  Labs:  Recent Labs  09/20/16 0822 09/21/16 0558 09/21/16 0940 09/22/16 0632  HGB 9.5* 7.8* 8.2* 6.1*    Recent Labs  09/21/16 0940 09/22/16 0632 09/22/16 0825  WBC 8.2 8.9  --   RBC 3.05* 2.32* 2.40*  HCT 27.9* 20.7*  --   PLT 64* 52*  --     Recent Labs  09/21/16 0558 09/22/16 0632  NA 138 135  K 4.3 4.0  CL 105 102  CO2 27 26  BUN 6 7  CREATININE 0.74 0.61  GLUCOSE 137* 110*  CALCIUM 8.7* 8.5*    Recent Labs  09/20/16 0822  INR 1.16    Physical Exam:  Neurologically intact ABD soft Neurovascular intact Sensation intact distally Intact pulses distally Dorsiflexion/Plantar flexion intact Incision: dressing C/D/I and scant drainage No cellulitis present Compartment soft  Assessment/Plan:  1 Day Post-Op Procedure(s) (LRB): INTRAMEDULLARY (IM) NAIL INTERTROCHANTRIC (Right) Advance diet Up with therapy Discharge to SNF after cleared by Medicine and PT. We greatly appreciate medical management.  Patient currently receiving blood for Hgb of 6.1 Continue on Eliquis x 4 weeks for DVT prevention. Partial weightbearing right leg Follow up in office 2 weeks post op.  Belem Hintze, Larwance Sachs 09/22/2016, 10:38 AM

## 2016-09-23 DIAGNOSIS — R946 Abnormal results of thyroid function studies: Secondary | ICD-10-CM

## 2016-09-23 LAB — BASIC METABOLIC PANEL
Anion gap: 5 (ref 5–15)
BUN: 6 mg/dL (ref 6–20)
CHLORIDE: 98 mmol/L — AB (ref 101–111)
CO2: 31 mmol/L (ref 22–32)
Calcium: 8.6 mg/dL — ABNORMAL LOW (ref 8.9–10.3)
Creatinine, Ser: 0.51 mg/dL (ref 0.44–1.00)
GFR calc Af Amer: >60 mL/min (ref >60)
GFR calc non Af Amer: >60 mL/min (ref >60)
Glucose, Bld: 101 mg/dL — ABNORMAL HIGH (ref 65–99)
Potassium: 3.7 mmol/L (ref 3.5–5.1)
Sodium: 134 mmol/L — ABNORMAL LOW (ref 135–145)

## 2016-09-23 LAB — TYPE AND SCREEN
BLOOD PRODUCT EXPIRATION DATE: 201801022359
BLOOD PRODUCT EXPIRATION DATE: 201801032359
BLOOD PRODUCT EXPIRATION DATE: 201801032359
Blood Product Expiration Date: 201801052359
ISSUE DATE / TIME: 201712240804
ISSUE DATE / TIME: 201712241219
ISSUE DATE / TIME: 201712241358
ISSUE DATE / TIME: 201712241429
UNIT TYPE AND RH: 5100
UNIT TYPE AND RH: 5100
Unit Type and Rh: 5100
Unit Type and Rh: 5100

## 2016-09-23 LAB — CBC
HEMATOCRIT: 29 % — AB (ref 36.0–46.0)
HEMOGLOBIN: 9.3 g/dL — AB (ref 12.0–15.0)
MCH: 27.3 pg (ref 26.0–34.0)
MCHC: 32.1 g/dL (ref 30.0–36.0)
MCV: 85 fL (ref 78.0–100.0)
Platelets: 41 10*3/uL — ABNORMAL LOW (ref 150–400)
RBC: 3.41 MIL/uL — ABNORMAL LOW (ref 3.87–5.11)
RDW: 17.6 % — ABNORMAL HIGH (ref 11.5–15.5)
WBC: 9 10*3/uL (ref 4.0–10.5)

## 2016-09-23 LAB — T4, FREE: Free T4: 1.41 ng/dL — ABNORMAL HIGH (ref 0.61–1.12)

## 2016-09-23 LAB — TSH: TSH: 1.002 u[IU]/mL (ref 0.350–4.500)

## 2016-09-23 MED ORDER — DOXYLAMINE SUCCINATE (SLEEP) 25 MG PO TABS
25.0000 mg | ORAL_TABLET | Freq: Every evening | ORAL | Status: DC | PRN
  Filled 2016-09-23 (×2): qty 1

## 2016-09-23 MED ORDER — WHITE PETROLATUM GEL: Status: DC | PRN

## 2016-09-23 MED ORDER — FAMOTIDINE 20 MG PO TABS
20.0000 mg | ORAL_TABLET | Freq: Two times a day (BID) | ORAL | Status: DC
  Administered 2016-09-23 – 2016-09-26 (×7): 20 mg via ORAL
  Filled 2016-09-23 (×7): qty 1

## 2016-09-23 MED ORDER — DIPHENHYDRAMINE HCL 25 MG PO CAPS
50.0000 mg | ORAL_CAPSULE | Freq: Once | ORAL | Status: AC
  Administered 2016-09-23: 50 mg via ORAL
  Filled 2016-09-23: qty 2

## 2016-09-23 MED ORDER — APIXABAN 2.5 MG PO TABS: 2.5000 mg | ORAL_TABLET | Freq: Two times a day (BID) | ORAL | 0 refills | Status: AC

## 2016-09-23 MED ORDER — HYDROCODONE-ACETAMINOPHEN 5-325 MG PO TABS: 1.0000 | ORAL_TABLET | ORAL | 0 refills | Status: DC | PRN

## 2016-09-23 NOTE — Progress Notes (Signed)
Subjective: 2 Days Post-Op Procedure(s) (LRB): INTRAMEDULLARY (IM) NAIL INTERTROCHANTRIC (Right)   Patient feeling much better today. No complaints.  Activity level:  Partial weightbearing right elg Diet tolerance:  ok Voiding:  ok Patient reports pain as mild.    Objective: Vital signs in last 24 hours: Temp:  [98.3 F (36.8 C)-100.3 F (37.9 C)] 98.3 F (36.8 C) (12/25 0437) Pulse Rate:  [114-131] 114 (12/25 0437) Resp:  [16-18] 18 (12/25 0437) BP: (108-130)/(48-83) 129/48 (12/25 0437) SpO2:  [93 %-96 %] 96 % (12/25 0437)  Labs:  Recent Labs  09/21/16 0558 09/21/16 0940 09/22/16 0632 09/23/16 0502  HGB 7.8* 8.2* 6.1* 9.3*    Recent Labs  09/22/16 0632 09/22/16 0825 09/23/16 0502  WBC 8.9  --  9.0  RBC 2.32* 2.40* 3.41*  HCT 20.7*  --  29.0*  PLT 52*  --  41*    Recent Labs  09/22/16 0632 09/23/16 0502  NA 135 134*  K 4.0 3.7  CL 102 98*  CO2 26 31  BUN 7 6  CREATININE 0.61 0.51  GLUCOSE 110* 101*  CALCIUM 8.5* 8.6*   No results for input(s): LABPT, INR in the last 72 hours.  Physical Exam:  Neurologically intact ABD soft Neurovascular intact Sensation intact distally Intact pulses distally Dorsiflexion/Plantar flexion intact Incision: dressing C/D/I and no drainage No cellulitis present Compartment soft  Assessment/Plan:  2 Days Post-Op Procedure(s) (LRB): INTRAMEDULLARY (IM) NAIL INTERTROCHANTRIC (Right) Advance diet Up with therapy Discharge to SNF when cleared by Medicine and PT. We greatly appreciate medical management.  Patient much better after receiving blood. Continue on Eliquis x 4 weeks for DVT prevention. Partial weightbearing right leg. Follow up in office 2 weeks post op.  Chigozie Basaldua, Larwance Sachs 09/23/2016, 10:44 AM

## 2016-09-23 NOTE — NC FL2 (Signed)
[place] MEDICAID FL2 LEVEL OF CARE SCREENING TOOL     IDENTIFICATION  Patient Name: Tracy Dodson Birthdate: Feb 25, 1951 Sex: female Admission Date (Current Location): 09/20/2016  [facility] and [place] Number:  Herbalist and Address:  [facility], [place] [address] [place], [place] [address]      Provider Number: [id number]  Attending Physician Name and Address:  Debbe Odea, MD  Relative Name and Phone Number:       Current Level of Care: Hospital Recommended Level of Care: [place] Prior Approval Number:    Date Approved/Denied:   PASRR Number: [id number]  Discharge Plan: SNF    Current Diagnoses: Patient Active Problem List   Diagnosis Date Noted  . Intertrochanteric fracture of right hip ([place]) 09/21/2016  . Bone metastasis ([place]) 09/21/2016  . Malnutrition of moderate degree 09/20/2016  . Closed displaced intertrochanteric fracture of right femur ([place])   . Fall   . S/P craniotomy 04/12/2016  . Metastatic adenocarcinoma to brain ([place]) 04/12/2016  . Diplopia 04/02/2016  . Cephalalgia   . Pancytopenia ([place]) 04/01/2016  . Brain metastasis ([place]) 04/01/2016  . Vasogenic brain edema ([place]) 04/01/2016  . Headache 04/01/2016  . Nausea 04/01/2016  . Hyponatremia 04/01/2016  . Hypertension 04/01/2016  . Depression with anxiety 04/01/2016  . Hypokalemia 02/29/2016  . 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 Encounter for antineoplastic chemotherapy 10/19/2015  . Skin macule or macular rash 09/23/2015  . 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 Primary cancer of right middle lobe of lung ([place]) 10/08/2012  . Pulmonary nodule new since 11/07/11 cxr 09/16/2012  . Smoker - quit 08/2012 09/16/2012    Orientation RESPIRATION BLADDER Height & Weight     Self, Time, Situation, Place  Normal Continent Weight: 60.3 kg (133 lb) Height:  '5\' 5"'$  (165.1 cm)  BEHAVIORAL SYMPTOMS/MOOD NEUROLOGICAL BOWEL NUTRITION STATUS       Continent Diet (Please see DC Summary)  AMBULATORY STATUS COMMUNICATION OF NEEDS Skin   Extensive Assist Verbally Surgical wounds (Closed incision on thigh)                       Personal Care Assistance Level of Assistance  Bathing, Feeding, Dressing Bathing Assistance: Maximum assistance Feeding assistance: Independent Dressing Assistance: Limited assistance     Functional Limitations Info             SPECIAL CARE FACTORS FREQUENCY  PT (By licensed PT), OT (By licensed OT)     PT Frequency: 5x/week OT Frequency: 3x/week            Contractures      Additional Factors Info  Code Status, Allergies Code Status Info: Full Allergies Info: Compazine Prochlorperazine Edisylate, Contrast Media Iodinated Diagnostic Agents           Current Medications (09/23/2016):  This is the current hospital active medication list Current Facility-Administered Medications  Medication Dose Route Frequency Provider Last Rate Last Dose  . acetaminophen (TYLENOL) tablet 650 mg  650 mg Oral Q6H PRN Loni Dolly, PA-C       Or  . acetaminophen (TYLENOL) suppository 650 mg  650 mg Rectal Q6H PRN Loni Dolly, PA-C      . apixaban Arne [place]) tablet 2.5 mg  2.5 mg Oral BID Loni Dolly, PA-C   2.5 mg at 09/23/16 0931  . cholecalciferol (VITAMIN D) tablet 2,000 Units  2,000 Units Oral Daily Dron Tanna Furry, MD   2,000 Units at 09/23/16  9622  . citalopram (CELEXA) tablet 20 mg  20 mg Oral Daily Dron Tanna Furry, MD   20 mg at 09/23/16 0930  . doxylamine (Sleep) (UNISOM) tablet 25 mg  25 mg Oral QHS PRN Debbe Odea, MD      . famotidine (PEPCID) tablet 20 mg  20 mg Oral BID Gaynell Face Hiatt, RPH   20 mg at 09/23/16 0931  . feeding supplement (ENSURE ENLIVE) (ENSURE ENLIVE) liquid 237 mL  237 mL Oral BID BM Dron Tanna Furry, MD   237 mL at 09/23/16 0931  . feeding supplement (PRO-STAT SUGAR FREE 64) liquid 30 mL  30 mL Oral Daily Dron Tanna Furry, MD   30 mL at 09/23/16 0931  .  HYDROcodone-acetaminophen (NORCO/VICODIN) 5-325 MG per tablet 2 tablet  2 tablet Oral Q3H PRN Loni Dolly, PA-C   2 tablet at 09/23/16 0657  . HYDROmorphone (DILAUDID) injection 1 mg  1 mg Intravenous Q3H PRN Dron Tanna Furry, MD   1 mg at 09/21/16 1027  . menthol-cetylpyridinium (CEPACOL) lozenge 3 mg  1 lozenge Oral PRN Loni Dolly, PA-C       Or  . phenol (CHLORASEPTIC) mouth spray 1 spray  1 spray Mouth/Throat PRN Loni Dolly, PA-C   1 spray at 09/22/16 1726  . methocarbamol (ROBAXIN) tablet 500 mg  500 mg Oral Q6H PRN Loni Dolly, PA-C       Or  . methocarbamol (ROBAXIN) 500 mg in dextrose 5 % 50 mL IVPB  500 mg Intravenous Q6H PRN Loni Dolly, PA-C      . metoCLOPramide (REGLAN) tablet 5-10 mg  5-10 mg Oral Q8H PRN Loni Dolly, PA-C       Or  . metoCLOPramide (REGLAN) injection 5-10 mg  5-10 mg Intravenous Q8H PRN Loni Dolly, PA-C      . multivitamin with minerals tablet 1 tablet  1 tablet Oral Daily Dron Tanna Furry, MD   1 tablet at 09/23/16 0930  . ondansetron (ZOFRAN) tablet 4 mg  4 mg Oral Q6H PRN Loni Dolly, PA-C       Or  . ondansetron [facility]) injection 4 mg  4 mg Intravenous Q6H PRN Loni Dolly, PA-C      . osimertinib mesylate (TAGRISSO) tablet 80 mg  80 mg Oral Daily Dron Tanna Furry, MD   80 mg at 09/23/16 2979  . oxyCODONE (OXYCONTIN) 12 hr tablet 20 mg  20 mg Oral Q12H Dron Tanna Furry, MD   20 mg at 09/23/16 0931  . polyethylene glycol (MIRALAX / GLYCOLAX) packet 17 g  17 g Oral Daily PRN Dron Tanna Furry, MD      . predniSONE (DELTASONE) tablet 2 mg  2 mg Oral Q supper Otilio Miu, RPH   2 mg at 09/22/16 1721  . predniSONE (DELTASONE) tablet 3 mg  3 mg Oral Q breakfast Dron Tanna Furry, MD   3 mg at 09/23/16 0930  . white petrolatum (VASELINE) gel   Topical PRN Debbe Odea, MD         Discharge Medications: Please see discharge summary for a list of discharge medications.  Relevant Imaging Results:  Relevant Lab  Results:   Additional Information SSN: [place], [place]

## 2016-09-23 NOTE — Progress Notes (Addendum)
PROGRESS NOTE    Tracy Dodson  YCX:[id number] DOB: March 16, 1951 DOA: 09/20/2016  PCP: [name], MD   Brief Narrative:  Tracy Dodson is a 65 y.o. 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 She had a CT done as outpt recently (not available in EPIC yet) and was called by her radiation oncologist while in the ER and told that she may have a hairline fracture in the right hip based on that CT. Bone scan on 12/14 which I am able to review in EPIC also showed increased activity in the right hip.   Subjective: No complaints. Actually feels much stronger today- she thinks its from receiving blood yesterday. Would like her Unisom as she takes at home tonight.    Assessment & Plan:   Principal Problem:   Intertrochanteric fracture of right hip -  12/23-  Intramedullary nail   - Ortho instructions: Continue on Eliquis x 4 weeks for DVT prevention. Partial weightbearing right leg Follow up in office 2 weeks post op.    Active Problems: Acute blood loss anemia - due to fracture Hb 8.2 >> 6.1 - transfused 2 U RPBC 12/24 - Anemia panel shows normal ferretin, B12, Folate and elevated retic count  Non-small cell lung cancer recurrent with brain metastasis (resected) and bone metastasis  - on Tagrisso per Dr Julien Nordmann - T10 met vs vertebral compression fx on recent bone scan - right hip met vs fracture (need to review recent CT) but uptake on bone scan can be also due to a fracture- xrays show no metastasis - Dr Sondra Come was considering palliative radiation to right hip and he suspected T10 lesion to be benign- he planned on consult for kyphoplasty - cont Prednisone  Sinus tachycardia- abnormal TFTs - persistent -currently has no pain or fever to be contributing to this - is not dehydrated  - does not feel  any palpitations - F T4 mildly elevated (1.41) and TSH normal (1.002) - recommend repeat in 1- 2 wks (as this could be sick euthyroid) to confirm accuracy and speak with Endo before starting treatment - other than tachycardia, no other signs of hyperthyroidism at this time  Thrombocytopenia - appears to be chronic    DVT prophylaxis: Eliquis Code Status: Full code Family Communication: husband Disposition Plan: likely SNF- social work consulted Consultants:   ortho Procedures:   R  Intramedullary nail    Antimicrobials:  Anti-infectives    Start     Dose/Rate Route Frequency Ordered Stop   09/21/16 1700  ceFAZolin (ANCEF) IVPB 2g/100 mL premix     2 g 200 mL/hr over 30 Minutes Intravenous Every 6 hours 09/21/16 1501 09/22/16 0007   09/21/16 1100  ceFAZolin (ANCEF) IVPB 2g/100 mL premix     2 g 200 mL/hr over 30 Minutes Intravenous  Once 09/21/16 1047 09/21/16 1141   09/21/16 1048  ceFAZolin (ANCEF) 2-4 GM/100ML-% IVPB    Comments:  Henrine Screws   : cabinet override      09/21/16 1048 09/21/16 1111       Objective: Vitals:   09/22/16 1252 09/22/16 1615 09/22/16 1922 09/23/16 0437  BP: 108/83 (!) 120/59 (!) 130/59 (!) 129/48  Pulse: (!) 129 (!) 116 (!) 117 (!) 114  Resp: _0 Temp: 100.3 F (37.9 C) 98.5 F (36.9 C) 98.7  F (37.1 C) 98.3 F (36.8 C)  TempSrc: Oral Oral Oral Oral  SpO2: 94% 95% 93% 96%  Weight:      Height:        Intake/Output Summary (Last 24 hours) at 09/23/16 1208 Last data filed at 09/23/16 0800  Gross per 24 hour  Intake             1011 ml  Output                0 ml  Net             1011 ml   Filed Weights   09/20/16 0830  Weight: 60.3 kg (133 lb)    Examination: General exam: Appears comfortable  HEENT: PERRLA, oral mucosa moist, no sclera icterus or thrush Respiratory system: Clear to auscultation. Respiratory effort normal. Cardiovascular system: S1 & S2 heard, RRR.  No murmurs - tachycardic Gastrointestinal  system: Abdomen soft, non-tender, nondistended. Normal bowel sound. No organomegaly Central nervous system: Alert and oriented. No focal neurological deficits. Extremities: No cyanosis, clubbing or edema Skin: No rashes or ulcers Psychiatry:  Mood & affect appropriate.     Data Reviewed: I have personally reviewed following labs and imaging studies  CBC:  Recent Labs Lab 09/20/16 0822 09/21/16 0558 09/21/16 0940 09/22/16 0632 09/23/16 0502  WBC 9.1 8.2 8.2 8.9 9.0  NEUTROABS 4.6  --   --   --   --   HGB 9.5* 7.8* 8.2* 6.1* 9.3*  HCT 30.8* 27.6* 27.9* 20.7* 29.0*  MCV 86.5 89.9 91.5 89.2 85.0  PLT 76* 65* 64* 52* 41*   Basic Metabolic Panel:  Recent Labs Lab 09/20/16 0822 09/21/16 0558 09/22/16 0632 09/23/16 0502  NA 137 138 135 134*  K 3.4* 4.3 4.0 3.7  CL 101 105 102 98*  CO2 _0 GLUCOSE 142* 137* 110* 101*  BUN _1 CREATININE 0.88 0.74 0.61 0.51  CALCIUM 9.4 8.7* 8.5* 8.6*   GFR: Estimated Creatinine Clearance: 64.8 mL/min (by C-G formula based on SCr of 0.51 mg/dL). Liver Function Tests: No results for input(s): AST, ALT, ALKPHOS, BILITOT, PROT, ALBUMIN in the last 168 hours. No results for input(s): LIPASE, AMYLASE in the last 168 hours. No results for input(s): AMMONIA in the last 168 hours. Coagulation Profile:  Recent Labs Lab 09/20/16 0822  INR 1.16   Cardiac Enzymes: No results for input(s): CKTOTAL, CKMB, CKMBINDEX, TROPONINI in the last 168 hours. BNP (last 3 results) No results for input(s): PROBNP in the last 8760 hours. HbA1C: No results for input(s): HGBA1C in the last 72 hours. CBG: No results for input(s): GLUCAP in the last 168 hours. Lipid Profile: No results for input(s): CHOL, HDL, LDLCALC, TRIG, CHOLHDL, LDLDIRECT in the last 72 hours. Thyroid Function Tests:  Recent Labs  09/23/16 0912  TSH 1.002  FREET4 1.41*   Anemia Panel:  Recent Labs  09/22/16 0825  VITAMINB12 596  FOLATE 16.3  FERRITIN 245    TIBC 172*  IRON 9*  RETICCTPCT 4.7*   Urine analysis:    Component Value Date/Time   COLORURINE YELLOW 02/04/2013 [place] 02/04/2013 1033   LABSPEC 1.007 02/04/2013 1033   PHURINE 7.0 02/04/2013 [place] 02/04/2013 1033   HGBUR NEGATIVE 02/04/2013 1033   [place] NEGATIVE 02/04/2013 [place] 02/04/2013 1033   PROTEINUR NEGATIVE 02/04/2013 1033   UROBILINOGEN 0.2 02/04/2013 1033   NITRITE NEGATIVE 02/04/2013 1033  LEUKOCYTESUR TRACE (A) 02/04/2013 1033   Sepsis Labs: _0 (procalcitonin:4,lacticidven:4) ) Recent Results (from the past 240 hour(s))  Surgical PCR screen     Status: None   Collection Time: 09/21/16  1:12 AM  Result Value Ref Range Status   MRSA, PCR NEGATIVE NEGATIVE Final   Staphylococcus aureus NEGATIVE NEGATIVE Final    Comment:        The Xpert SA Assay (FDA approved for NASAL specimens in patients over 49 years of age), is one component of a comprehensive surveillance program.  Test performance has been validated by [facility] for patients greater than or equal to 69 year old. It is not intended to diagnose infection nor to guide or monitor treatment.          Radiology Studies: Dg C-arm 1-60 Min  Result Date: 09/21/2016 CLINICAL DATA:  Intramedullary nail placement. EXAM: DG C-ARM 61-120 MIN COMPARISON:  None. FINDINGS: Fluoroscopic images demonstrate placement of intramedullary right femoral nail, affixing intertrochanteric right proximal femoral fracture. The alignment is near anatomic. No evidence of immediate complications. Fluoroscopy time is recorded as 1 minutes 48 seconds. IMPRESSION: Intramedullary nail fixation of intertrochanteric right proximal femoral fracture, without evidence of immediate complications. Electronically Signed   By: Fidela Salisbury M.D.   On: 09/21/2016 13:38   Dg Hip Operative Unilat W Or W/o Pelvis Right  Result Date: 09/21/2016 CLINICAL DATA:   Fixation right hip fracture. EXAM: OPERATIVE right HIP (WITH PELVIS IF PERFORMED) 4 VIEWS TECHNIQUE: Fluoroscopic spot image(s) were submitted for interpretation post-operatively. COMPARISON:  09/20/2016 FINDINGS: Examination demonstrates placement of a right femoral intramedullary nail with associated compression screw bridging patient's trochanteric fracture into the femoral head. Hardware is intact as there is near anatomic alignment over the fracture site. IMPRESSION: Post fixation of right intertrochanteric fracture with hardware intact and near anatomic alignment over the fracture site. Electronically Signed   By: Marin Olp M.D.   On: 09/21/2016 13:38      Scheduled Meds: . apixaban  2.5 mg Oral BID  . cholecalciferol  2,000 Units Oral Daily  . citalopram  20 mg Oral Daily  . famotidine  20 mg Oral BID  . feeding supplement (ENSURE ENLIVE)  237 mL Oral BID BM  . feeding supplement (PRO-STAT SUGAR FREE 64)  30 mL Oral Daily  . multivitamin with minerals  1 tablet Oral Daily  . osimertinib mesylate  80 mg Oral Daily  . oxyCODONE  20 mg Oral Q12H  . predniSONE  2 mg Oral Q supper  . predniSONE  3 mg Oral Q breakfast   Continuous Infusions:    LOS: 3 days    Time spent in minutes: [place], MD [facility] Pager: [email or web address] Password [facility] 09/23/2016, 12:08 PM

## 2016-09-24 ENCOUNTER — Other Ambulatory Visit: Payer: PRIVATE HEALTH INSURANCE

## 2016-09-24 ENCOUNTER — Ambulatory Visit: Payer: PRIVATE HEALTH INSURANCE | Admitting: Radiation Oncology

## 2016-09-24 ENCOUNTER — Encounter: Payer: Self-pay | Admitting: Radiation Oncology

## 2016-09-24 ENCOUNTER — Ambulatory Visit
Admission: RE | Admit: 2016-09-24 | Payer: PRIVATE HEALTH INSURANCE | Source: Ambulatory Visit | Admitting: Radiation Oncology

## 2016-09-24 ENCOUNTER — Encounter (HOSPITAL_COMMUNITY): Payer: Self-pay | Admitting: Orthopaedic Surgery

## 2016-09-24 ENCOUNTER — Other Ambulatory Visit: Payer: Self-pay | Admitting: Emergency Medicine

## 2016-09-24 ENCOUNTER — Ambulatory Visit: Payer: PRIVATE HEALTH INSURANCE | Admitting: Internal Medicine

## 2016-09-24 DIAGNOSIS — C342 Malignant neoplasm of middle lobe, bronchus or lung: Secondary | ICD-10-CM

## 2016-09-24 LAB — BASIC METABOLIC PANEL
ANION GAP: 8 (ref 5–15)
BUN: 6 mg/dL (ref 6–20)
CALCIUM: 8.6 mg/dL — AB (ref 8.9–10.3)
CO2: 27 mmol/L (ref 22–32)
Chloride: 101 mmol/L (ref 101–111)
Creatinine, Ser: 0.47 mg/dL (ref 0.44–1.00)
GFR calc Af Amer: >60 mL/min (ref >60)
GFR calc non Af Amer: >60 mL/min (ref >60)
GLUCOSE: 97 mg/dL (ref 65–99)
POTASSIUM: 3.8 mmol/L (ref 3.5–5.1)
Sodium: 136 mmol/L (ref 135–145)

## 2016-09-24 LAB — CBC
HEMATOCRIT: 26.5 % — AB (ref 36.0–46.0)
HEMATOCRIT: 28.1 % — AB (ref 36.0–46.0)
HEMOGLOBIN: 8.7 g/dL — AB (ref 12.0–15.0)
Hemoglobin: 8.3 g/dL — ABNORMAL LOW (ref 12.0–15.0)
MCH: 27.6 pg (ref 26.0–34.0)
MCH: 27.4 pg (ref 26.0–34.0)
MCHC: 31.3 g/dL (ref 30.0–36.0)
MCHC: 31 g/dL (ref 30.0–36.0)
MCV: 88 fL (ref 78.0–100.0)
MCV: 88.6 fL (ref 78.0–100.0)
Platelets: 41 10*3/uL — ABNORMAL LOW (ref 150–400)
Platelets: 36 10*3/uL — ABNORMAL LOW (ref 150–400)
RBC: 3.01 MIL/uL — ABNORMAL LOW (ref 3.87–5.11)
RBC: 3.17 MIL/uL — ABNORMAL LOW (ref 3.87–5.11)
RDW: 18 % — AB (ref 11.5–15.5)
RDW: 17.8 % — ABNORMAL HIGH (ref 11.5–15.5)
WBC: 5.3 10*3/uL (ref 4.0–10.5)
WBC: 6 10*3/uL (ref 4.0–10.5)

## 2016-09-24 MED ORDER — DIPHENHYDRAMINE HCL 25 MG PO CAPS
25.0000 mg | ORAL_CAPSULE | Freq: Every day | ORAL | Status: DC
  Administered 2016-09-24 – 2016-09-25 (×2): 25 mg via ORAL
  Filled 2016-09-24 (×2): qty 1

## 2016-09-24 MED ORDER — DOXYLAMINE SUCCINATE (SLEEP) 25 MG PO TABS: 25.0000 mg | ORAL_TABLET | Freq: Every day | ORAL | 0 refills | Status: AC

## 2016-09-24 MED ORDER — OXYCODONE HCL ER 20 MG PO T12A
20.0000 mg | EXTENDED_RELEASE_TABLET | Freq: Two times a day (BID) | ORAL | 0 refills | Status: DC
Start: 2016-09-24 — End: 2016-09-26

## 2016-09-24 MED ORDER — SODIUM CHLORIDE 0.9% FLUSH: 10.0000 mL | INTRAVENOUS | Status: DC | PRN

## 2016-09-24 NOTE — Progress Notes (Addendum)
Subjective: 3 Days Post-Op Procedure(s) (LRB): INTRAMEDULLARY (IM) NAIL INTERTROCHANTRIC (Right) Patient reports pain as mild. She does complain of some lower thoracic pain. She reports a compression fracture at T10. She states that Dr. Inda Merlin was planning on evaluation for possiblekyphoplasty/vertebral plasty.   Objective: Vital signs in last 24 hours: Temp:  [97.5 F (36.4 C)-99.8 F (37.7 C)] (P) 99.8 F (37.7 C) (12/26 1359) Pulse Rate:  [107-113] (P) 113 (12/26 1359) Resp:  [16-18] (P) 16 (12/26 1359) BP: (122-130)/(60-67) (P) 120/51 (12/26 1359) SpO2:  [95 %-98 %] (P) 95 % (12/26 1359)  Intake/Output from previous day: 12/25 0701 - 12/26 0700 In: 720 [P.O.:720] Out: -  Intake/Output this shift: No intake/output data recorded.   Recent Labs  09/22/16 0632 09/23/16 0502 09/24/16 0349 09/24/16 1118  HGB 6.1* 9.3* 8.3* 8.7*    Recent Labs  09/24/16 0349 09/24/16 1118  WBC 5.3 6.0  RBC 3.01* 3.17*  HCT 26.5* 28.1*  PLT 41* 36*    Recent Labs  09/23/16 0502 09/24/16 0349  NA 134* 136  K 3.7 3.8  CL 98* 101  CO2 31 27  BUN 6 6  CREATININE 0.51 0.47  GLUCOSE 101* 97  CALCIUM 8.6* 8.6*   No results for input(s): LABPT, INR in the last 72 hours. Right hip exam: Dressings are clean and dry.  She does have some thigh swelling as to be expected.  Some bruising in her lower leg.  Right calf is soft and nontender.  Assessment/Plan: 3 Days Post-Op Procedure(s) (LRB): INTRAMEDULLARY (IM) NAIL INTERTROCHANTRIC (Right)  Compression fracture at T4? and compression fracture  T10 on MRI scan dated 08/1016. Plan: Partial weightbearing on right with physical therapy. Continue Eliquis 2.5 mg twice daily for DVT prophylaxis. The patient is asking about having interventional radiology evaluate her compression fracture for possible kyphoplasty.  I will leave that up to the hospitalists.  If being discharged today can address kyphoplasty if needed on an outpatient  basis in the future. Okay for transfer to skilled nursing facility when medically stable and arrangements can be made. Follow-up with Dr. Rhona Raider in 2 weeks.   Taryn Nave G 09/24/2016, 2:25 PM

## 2016-09-24 NOTE — Progress Notes (Signed)
Physical Therapy Treatment Patient Details Name: Tracy Dodson MRN: [id number] DOB: 1951/08/01 Today's Date: 09/24/2016    History of Present Illness Pt is a 65 yo female admitted through ED on 09/20/16 after a fall in her bathroom resulting in R hip intertrochanteric Fracture. Pt underwent ORIF on 09/21/16 and is PWB. PMH significant for lung cancer with recent metastasis to brain and bone and recently underwent chemo and radiation. pt is currently taking chemo PO medication.      PT Comments    Pt was just finishing up with MSW prior to this visit. Pt's insurance is not going to cover SNF placement and pt will need to be discharged home with HHPT services. Pt has 4 steps leading into her front door with railings on both sides and 2 steps into back door. Pt must negotiate 6 steps with railing to second level of home in order to get to bathroom and bedroom. Pt performed short distance gait this session x 25' with good adherence to weight bearing restrictions. Pt will require additional therapy visits in order to address stairs and further gait distance for safe return home.    Follow Up Recommendations  Home health PT;Supervision - Intermittent     Equipment Recommendations  Rolling walker with 5" wheels;3in1 (PT);Hospital bed;Other (comment) (pt questioning whether she needs hospital bed)    Recommendations for Other Services       Precautions / Restrictions Precautions Precautions: Fall Precaution Comments: s/p fall in bathroom Restrictions Weight Bearing Restrictions: Yes RLE Weight Bearing: Partial weight bearing RLE Partial Weight Bearing Percentage or Pounds: not specified    Mobility  Bed Mobility Overal bed mobility: Needs Assistance Bed Mobility: Supine to Sit     Supine to sit: HOB elevated;Mod assist     General bed mobility comments: Mod A to bring LE's to EOB and use of chuck to move pt's bottom forward on bed  Transfers Overall transfer level: Needs  assistance Equipment used: Rolling walker (2 wheeled) Transfers: Sit to/from Stand Sit to Stand: Min assist         General transfer comment: Min A for safety from EOB with assistance under LUE to RW  Ambulation/Gait Ambulation/Gait assistance: Min assist Ambulation Distance (Feet): 25 Feet Assistive device: Rolling walker (2 wheeled) Gait Pattern/deviations: Step-to pattern;Decreased stance time - right;Antalgic Gait velocity: decreased Gait velocity interpretation: Below normal speed for age/gender General Gait Details: min cues for proper sequencing with RLE and RW use, improved tolerance as gait distance increases.    Stairs            Wheelchair Mobility    Modified Rankin (Stroke Patients Only)       Balance                                    Cognition Arousal/Alertness: Awake/alert Behavior During Therapy: WFL for tasks assessed/performed Overall Cognitive Status: Within Functional Limits for tasks assessed                      Exercises      General Comments        Pertinent Vitals/Pain Pain Assessment: 0-10 Pain Score: 4  Pain Location: R hip; low back Pain Descriptors / Indicators: Aching;Grimacing;Sore Pain Intervention(s): Monitored during session;Limited activity within patient's tolerance    Home Living  Prior Function            PT Goals (current goals can now be found in the care plan section) Acute Rehab PT Goals Patient Stated Goal: to get home safely Progress towards PT goals: Progressing toward goals    Frequency    Min 5X/week      PT Plan Discharge plan needs to be updated;Equipment recommendations need to be updated;Frequency needs to be updated    Co-evaluation             End of Session Equipment Utilized During Treatment: Gait belt Activity Tolerance: Patient tolerated treatment well;Patient limited by pain Patient left: in chair;with call bell/phone  within reach     Time: 9292-4462 PT Time Calculation (min) (ACUTE ONLY): 30 min  Charges:  $Gait Training: 8-22 mins $Therapeutic Activity: 8-22 mins                    G Codes:      Scheryl Marten PT, DPT  [phone]  09/24/2016, 3:43 PM

## 2016-09-24 NOTE — Discharge Summary (Addendum)
Physician Discharge Summary  Tracy Dodson:[id number] DOB: 22-Jan-1951 DOA: 09/20/2016  PCP: Duane Lope, MD  Admit date: 09/20/2016 Discharge date: 09/24/2016  Admitted From: home   Disposition: home with home health   Recommendations for Outpatient Follow-up:  1. recommend repeat Thyroid function tests in 1- 2 wks to confirm accuracy and speak with Endo before starting treatment 2. Due to chronic thrombocytopenia and use of Apixaban, follow closely for bleeding      Discharge Condition:  stable   CODE STATUS:  Full code   Diet recommendation:  Low sodium, heart helthy Consultations:  oncology   Discharge Diagnoses:  Principal Problem:   Intertrochanteric fracture of right hip [facility]) Active Problems:   Primary cancer of right middle lobe of lung (HCC)   Brain metastasis (HCC)   Malnutrition of moderate degree   Bone metastasis (HCC)    Subjective: No complaints today.   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 She had a CT done as outpt recently (not available in EPIC yet) and was called by her radiation oncologist while in the ER and told that she may have a hairline fracture in the right hip based on that CT. Bone scan on 12/14 which I am able to review in EPIC also showed increased activity in the right hip.   Hospital Course:  Principal Problem:   Intertrochanteric fracture of right hip -  12/23-  Intramedullary nail   - Ortho instructions: Continue on Eliquis x 4 weeks for DVT prevention. Partial weightbearing right leg Follow up in office 2 weeks post op.    Active Problems: Acute blood loss anemia - due to fracture -  Hb 8.2 >> 6.1- transfused 2 U RPBC 12/24 bringing Hb back up to 8-9 range - Anemia panel shows normal  ferretin, B12, Folate and elevated retic count  Non-small cell lung cancer recurrent with brain metastasis (resected) and bone metastasis  - on Tagrisso per Dr Arbutus Ped - T10 met vs vertebral compression fx on recent bone scan - right hip met vs fracture (need to review recent CT) but uptake on bone scan can be also due to a fracture- xrays show no metastasis - Dr Roselind Messier was considering palliative radiation to right hip and he suspected T10 lesion to be benign- he planned on consult for kyphoplasty- she has had no back pain  - cont Prednisone  Sinus tachycardia- abnormal TFTs - persistent mild tachycardia in low 100s with no underlying physiologic cause - does not feel any palpitations - F T4 mildly elevated (1.41) and TSH normal (1.002) - recommend repeat in 1- 2 wks (as this could be sick euthyroid) to confirm accuracy and speak with Endo before starting treatment - other than tachycardia, no other signs of hyperthyroidism at this time  Thrombocytopenia - appears to be chronic- follow closely for bleeding on Apixaban   Discharge Instructions  Discharge Instructions    Diet - low sodium heart healthy    Complete by:  As directed    Increase activity slowly    Complete by:  As directed    Partial weight bearing    Complete by:  As directed      Allergies as of 09/24/2016      Reactions   Compazine [prochlorperazine Edisylate] Other (See Comments)   Mental  status changes -confusion    Contrast Media [iodinated Diagnostic Agents] Hives, Rash   Looked like I had the measles. Red spotted rash. Pt.states from PET scan Per pt ---no allergy to IV contrast media  02/29/16      Medication List    STOP taking these medications   LORazepam 0.5 MG tablet Commonly known as:  ATIVAN   OVER THE COUNTER MEDICATION     TAKE these medications   acetaminophen 325 MG tablet Commonly known as:  TYLENOL Take 325 mg by mouth every 6 (six) hours as needed for moderate pain or headache.    apixaban 2.5 MG Tabs tablet Commonly known as:  ELIQUIS Take 1 tablet (2.5 mg total) by mouth 2 (two) times daily.   CENTRUM SILVER PO Take 1 capsule by mouth daily.   cholecalciferol 1000 units tablet Commonly known as:  VITAMIN D Take 2,000 Units by mouth daily.   citalopram 20 MG tablet Commonly known as:  CELEXA Take '20mg'$  by mouth once daily   doxylamine (Sleep) 25 MG tablet Commonly known as:  UNISOM Take 1 tablet (25 mg total) by mouth at bedtime.   HYDROcodone-acetaminophen 5-325 MG tablet Commonly known as:  NORCO/VICODIN Take 1-2 tablets by mouth every 3 (three) hours as needed for moderate pain (may take 2 tablets every 3-4 hours prn for back pain). What changed:  how much to take   oxyCODONE 20 mg 12 hr tablet Commonly known as:  OXYCONTIN Take 1 tablet (20 mg total) by mouth every 12 (twelve) hours.   pantoprazole 40 MG tablet Commonly known as:  PROTONIX Take 1 tablet (40 mg total) by mouth daily. Switch for any other PPI at similar dose and frequency What changed:  additional instructions   predniSONE 1 MG tablet Commonly known as:  DELTASONE Take 2-3 mg by mouth 2 (two) times daily with a meal. Take '3mg'$  in the am, and '2mg'$  at night for a total of '5mg'$  qd.[name]   PREVIDENT 5000 DRY MOUTH 1.1 % Gel dental gel Generic drug:  sodium fluoride Place 1 application onto teeth daily as needed (sensitive toothpaste).   TAGRISSO 80 MG tablet Generic drug:  osimertinib mesylate TAKE 1 TABLET BY MOUTH ONCE DAILY What changed:  See the new instructions.      Follow-up Information    DALLDORF,PETER G, MD. Schedule an appointment as soon as possible for a visit in 2 week(s).   Specialty:  Orthopedic Surgery Contact information: [place] [address] [phone]          Allergies  Allergen Reactions  . Compazine [Prochlorperazine Edisylate] Other (See Comments)    Mental status changes -confusion   . Contrast Media [Iodinated Diagnostic  Agents] Hives and Rash    Looked like I had the measles. Red spotted rash. Pt.states from PET scan Per pt ---no allergy to IV contrast media  02/29/16     Procedures/Studies: 12/23- IM nail R hip  Dg Chest 1 View  Result Date: 09/20/2016 CLINICAL DATA:  65 year old female with history of trauma from a fall at home. History of lung cancer. EXAM: CHEST 1 VIEW COMPARISON:  Chest x-ray 08/30/2016. FINDINGS: Known left upper lobe and right lower lobe nodules are not well demonstrated on today's chest radiograph, which could indicate positive response to therapy, or could partially be related to decreased sensitivity of chest radiographs. No acute consolidative airspace disease. No pleural effusions. Chronic scarring in the right lung base is unchanged. No pneumothorax. No evidence of pulmonary  edema. Heart size is normal. The patient is rotated to the left on today's exam, resulting in distortion of the mediastinal contours and reduced diagnostic sensitivity and specificity for mediastinal pathology. Visualized bony thorax appears grossly intact. Right internal jugular single-lumen power porta cath with tip terminating in the distal superior vena cava. IMPRESSION: 1. No evidence of significant acute traumatic injury to the thorax. 2. Known left upper lobe and right lower lobe pulmonary nodules are poorly demonstrated on today's chest radiograph. Electronically Signed   By: Vinnie Langton M.D.   On: 09/20/2016 08:57   Dg Thoracic Spine W/swimmers  Result Date: 08/30/2016 CLINICAL DATA:  Fall backwards onto radiation therapy table 3 days ago. Initial encounter EXAM: THORACIC SPINE - 3 VIEWS COMPARISON:  Body CT 07/19/2016 FINDINGS: Lower thoracic compression fracture with moderate height loss, new from 07/19/2016. The fracture is likely at the T10 level. Bilateral pulmonary nodules, known. Exaggerated thoracic kyphosis and generalized disc narrowing. IMPRESSION: Lower thoracic (likely T10) moderate  compression fracture that is acute or subacute based development since 07/19/2016 CT. Electronically Signed   By: Monte Fantasia M.D.   On: 08/30/2016 09:50   Ct Thoracic Spine Wo Contrast  Result Date: 08/30/2016 CLINICAL DATA:  Acute mid back pain.  History of lung cancer. EXAM: CT THORACIC SPINE WITHOUT CONTRAST TECHNIQUE: Multidetector CT images of the thoracic were obtained using the standard protocol without intravenous contrast. COMPARISON:  CT scan of July 19, 2016.  Radiographs of same day. FINDINGS: Alignment: No spondylolisthesis is noted. Vertebrae: There is interval development of moderate compression deformity of T10 vertebral body concerning for acute or subacute fracture. There is no retropulsion of fragments into spinal canal. Paraspinal and other soft tissues: Irregular left upper lobe mass is significantly increased in size, measuring 2.4 x 2.1 cm currently. Stable 3.0 x 2.5 cm right lower lobe mass is noted. No significant central spinal canal stenosis is noted. Disc levels: Multilevel degenerative disc disease is noted in the lower thoracic spine. IMPRESSION: Left upper lobe irregular mass is increased in size compared to prior exam, concerning for worsening malignancy. Stable right lower lobe mass is noted also concerning for malignancy or metastatic disease. Interval development of moderate compression deformity of T10 vertebral body concerning for acute or subacute fracture. Given the history of malignancy, metastatic disease cannot be excluded, although no significant lytic destruction is seen at this time. MRI may be performed for further evaluation. These results will be called to the ordering clinician or representative by the Radiologist Assistant, and communication documented in the PACS or zVision Dashboard. Electronically Signed   By: Marijo Conception, M.D.   On: 08/30/2016 13:11   Mr Thoracic Spine W Wo Contrast  Result Date: 09/08/2016 CLINICAL DATA:  Weakness in the  back in the right leg. Pain in the back, right leg, and shoulder. Bowel and bladder habit changes. Right middle lobe lung cancer. Compression fractures in the thoracic spine, query malignant involvement. EXAM: MRI THORACIC SPINE WITHOUT AND WITH CONTRAST TECHNIQUE: Multiplanar and multiecho pulse sequences of the thoracic spine were obtained without and with intravenous contrast. CONTRAST:  47m MULTIHANCE GADOBENATE DIMEGLUMINE 529 MG/ML IV SOLN COMPARISON:  08/30/2016 FINDINGS: MRI THORACIC SPINE FINDINGS Alignment: No thoracic spine malalignment. There is minimal grade 1 degenerative retrolisthesis incidentally seen at the L2- 3 level. Vertebrae: There is a 60% compression fracture of T10 mainly involving the inferior endplate with 4 mm of posterior bony retropulsion along the inferior endplate. Generally there is reduced T1 and some  faintly increased T2 signal throughout the vertebral body along with paraspinal edema, but I do not demonstrate specific or highly characteristic findings of metastatic disease at the T10 level. However, there is an abnormal T2 hyperintense and hand enhancing lesion in the posterior aspect of the right T4 vertebral body measuring 1.4 by 1.8 by 2.2 cm, also with extension through the right pedicle and into the right transverse process and concern for a fracture the right pedicle and transverse process base, or bony destructive findings. This appearance is more concerning for tumor, and there is potentially involvement of the spinous process and left lamina as well. There is minimal chronic superior endplate concavity at T4 and T5. Thoracic kyphosis is present. Cord:  No significant abnormal spinal cord signal is observed. Paraspinal and other soft tissues: I do not see any definite paraspinal tumor. There is paraspinal edema by the T10 compression fracture. Tumor is observed in the left lung and possibly in the right lower lobe as well, better characterized on dedicated chest imaging.  Disc levels: No findings of impingement or significant degenerative disc disease above the T9-10 level. T9-10: Borderline right foraminal stenosis and borderline central narrowing of the thecal sac due to facet arthropathy. T10-11: Mild central narrowing of the thecal sac due to posterior retropulsion from the inferior endplate compression fracture at T10. T11-12: Unremarkable. T12-L1: Unremarkable. IMPRESSION: 1. Abnormal lesion compatible with tumor posteriorly in the T4 vertebral body and extending around in the posterior elements and transverse processes. No epidural tumor identified. No cord lesion seen. 2. 60% compression fracture of T10 is probably benign but is associated with 4 mm of posterior bony retropulsion which causes mild central narrowing of the thecal sac at the T10-11 level. 3. Borderline right foraminal stenosis and borderline central narrowing of the thecal sac at T9-10 due to facet arthropathy. 4. Thoracic kyphosis. 5. Abnormal signal in both lungs favoring tumor. Electronically Signed   By: Van Clines M.D.   On: 09/08/2016 17:10   Nm Bone Scan Whole Body  Result Date: 09/12/2016 CLINICAL DATA:  History of lung carcinoma, with mid back pain and right hip pain evaluate for metastasis EXAM: NUCLEAR MEDICINE WHOLE BODY BONE SCAN TECHNIQUE: Whole body anterior and posterior images were obtained approximately 3 hours after intravenous injection of radiopharmaceutical. RADIOPHARMACEUTICALS:  19.9 mCi Technetium-16mMDP IV COMPARISON:  PET-CT of 09/19/2015 and CT chest of 12/28/2012 FINDINGS: There is activity in the lower thoracic spine in the region of T10 or T11 worrisome for metastatic deposit. Somewhat mottled activity throughout the ribs also could indicate metastases. Also, there is activity within the proximal right femur -hip pre some this could be degenerative, but metastatic involvement is difficult to exclude. Probable degenerative change in both shoulders is noted  symmetrically. IMPRESSION: 1. Increased activity in the lower thoracic spine in the region of T10 worrisome for metastatic lesion or compression deformity. Correlate with plain films. 2. Increased activity in the right hip. Correlate with plain films as well. 3. Somewhat mottled activity throughout multiple ribs. Metastatic lesions cannot be excluded. Electronically Signed   By: PIvar DrapeM.D.   On: 09/12/2016 13:54   Dg C-arm 1-60 Min  Result Date: 09/21/2016 CLINICAL DATA:  Intramedullary nail placement. EXAM: DG C-ARM 61-120 MIN COMPARISON:  None. FINDINGS: Fluoroscopic images demonstrate placement of intramedullary right femoral nail, affixing intertrochanteric right proximal femoral fracture. The alignment is near anatomic. No evidence of immediate complications. Fluoroscopy time is recorded as 1 minutes 48 seconds. IMPRESSION: Intramedullary nail fixation  of intertrochanteric right proximal femoral fracture, without evidence of immediate complications. Electronically Signed   By: Fidela Salisbury M.D.   On: 09/21/2016 13:38   Dg Hip Operative Unilat W Or W/o Pelvis Right  Result Date: 09/21/2016 CLINICAL DATA:  Fixation right hip fracture. EXAM: OPERATIVE right HIP (WITH PELVIS IF PERFORMED) 4 VIEWS TECHNIQUE: Fluoroscopic spot image(s) were submitted for interpretation post-operatively. COMPARISON:  09/20/2016 FINDINGS: Examination demonstrates placement of a right femoral intramedullary nail with associated compression screw bridging patient's trochanteric fracture into the femoral head. Hardware is intact as there is near anatomic alignment over the fracture site. IMPRESSION: Post fixation of right intertrochanteric fracture with hardware intact and near anatomic alignment over the fracture site. Electronically Signed   By: Marin Olp M.D.   On: 09/21/2016 13:38   Dg Hip Unilat  With Pelvis 2-3 Views Right  Result Date: 09/20/2016 CLINICAL DATA:  Fall at home in shower.  Initial  encounter. EXAM: DG HIP (WITH OR WITHOUT PELVIS) 2-3V RIGHT COMPARISON:  None. FINDINGS: Acute intertrochanteric right femur fracture with varus angulation from prominent displacement. The hips are located. No evidence of pelvic ring fracture or diastasis. IMPRESSION: Displaced intertrochanteric right femur fracture. Electronically Signed   By: Monte Fantasia M.D.   On: 09/20/2016 08:53        Discharge Exam: Vitals:   09/23/16 2020 09/24/16 0505  BP: 122/67 130/60  Pulse: (!) 110 (!) 108  Resp: 18 17  Temp: 98.6 F (37 C) 97.6 F (36.4 C)   Vitals:   09/23/16 0437 09/23/16 1455 09/23/16 2020 09/24/16 0505  BP: (!) 129/48 130/65 122/67 130/60  Pulse: (!) 114 (!) 107 (!) 110 (!) 108  Resp: '18 18 18 17  '$ Temp: 98.3 F (36.8 C) 97.5 F (36.4 C) 98.6 F (37 C) 97.6 F (36.4 C)  TempSrc: Oral Oral Oral Oral  SpO2: 96% 96% 98% 96%  Weight:      Height:        General: Pt is alert, awake, not in acute distress Cardiovascular: RRR, S1/S2 +, no rubs, no gallops Respiratory: CTA bilaterally, no wheezing, no rhonchi Abdominal: Soft, NT, ND, bowel sounds + Extremities: no edema, no cyanosis    The results of significant diagnostics from this hospitalization (including imaging, microbiology, ancillary and laboratory) are listed below for reference.     Microbiology: Recent Results (from the past 240 hour(s))  Surgical PCR screen     Status: None   Collection Time: 09/21/16  1:12 AM  Result Value Ref Range Status   MRSA, PCR NEGATIVE NEGATIVE Final   Staphylococcus aureus NEGATIVE NEGATIVE Final    Comment:        The Xpert SA Assay (FDA approved for NASAL specimens in patients over 13 years of age), is one component of a comprehensive surveillance program.  Test performance has been validated by [facility] for patients greater than or equal to 54 year old. It is not intended to diagnose infection nor to guide or monitor treatment.      Labs: BNP (last 3  results) No results for input(s): BNP in the last 8760 hours. Basic Metabolic Panel:  Recent Labs Lab 09/20/16 0822 09/21/16 0558 09/22/16 0632 09/23/16 0502 09/24/16 0349  NA 137 138 135 134* 136  K 3.4* 4.3 4.0 3.7 3.8  CL 101 105 102 98* 101  CO2 '24 27 26 31 27  '$ GLUCOSE 142* 137* 110* 101* 97  BUN '7 6 7 6 6  '$ CREATININE 0.88 0.74 0.61  0.51 0.47  CALCIUM 9.4 8.7* 8.5* 8.6* 8.6*   Liver Function Tests: No results for input(s): AST, ALT, ALKPHOS, BILITOT, PROT, ALBUMIN in the last 168 hours. No results for input(s): LIPASE, AMYLASE in the last 168 hours. No results for input(s): AMMONIA in the last 168 hours. CBC:  Recent Labs Lab 09/20/16 0822  09/21/16 0940 09/22/16 0632 09/23/16 0502 09/24/16 0349 09/24/16 1118  WBC 9.1  < > 8.2 8.9 9.0 5.3 6.0  NEUTROABS 4.6  --   --   --   --   --   --   HGB 9.5*  < > 8.2* 6.1* 9.3* 8.3* 8.7*  HCT 30.8*  < > 27.9* 20.7* 29.0* 26.5* 28.1*  MCV 86.5  < > 91.5 89.2 85.0 88.0 88.6  PLT 76*  < > 64* 52* 41* 41* 36*  < > = values in this interval not displayed. Cardiac Enzymes: No results for input(s): CKTOTAL, CKMB, CKMBINDEX, TROPONINI in the last 168 hours. BNP: Invalid input(s): POCBNP CBG: No results for input(s): GLUCAP in the last 168 hours. D-Dimer No results for input(s): DDIMER in the last 72 hours. Hgb A1c No results for input(s): HGBA1C in the last 72 hours. Lipid Profile No results for input(s): CHOL, HDL, LDLCALC, TRIG, CHOLHDL, LDLDIRECT in the last 72 hours. Thyroid function studies  Recent Labs  09/23/16 0912  TSH 1.002   Anemia work up  Recent Labs  09/22/16 0825  VITAMINB12 596  FOLATE 16.3  FERRITIN 245  TIBC 172*  IRON 9*  RETICCTPCT 4.7*   Urinalysis    Component Value Date/Time   COLORURINE YELLOW 02/04/2013 Roy 02/04/2013 1033   LABSPEC 1.007 02/04/2013 1033   PHURINE 7.0 02/04/2013 1033   GLUCOSEU NEGATIVE 02/04/2013 1033   HGBUR NEGATIVE 02/04/2013 1033    BILIRUBINUR NEGATIVE 02/04/2013 1033   KETONESUR NEGATIVE 02/04/2013 1033   PROTEINUR NEGATIVE 02/04/2013 1033   UROBILINOGEN 0.2 02/04/2013 1033   NITRITE NEGATIVE 02/04/2013 1033   LEUKOCYTESUR TRACE (A) 02/04/2013 1033   Sepsis Labs Invalid input(s): PROCALCITONIN,  WBC,  LACTICIDVEN Microbiology Recent Results (from the past 240 hour(s))  Surgical PCR screen     Status: None   Collection Time: 09/21/16  1:12 AM  Result Value Ref Range Status   MRSA, PCR NEGATIVE NEGATIVE Final   Staphylococcus aureus NEGATIVE NEGATIVE Final    Comment:        The Xpert SA Assay (FDA approved for NASAL specimens in patients over 78 years of age), is one component of a comprehensive surveillance program.  Test performance has been validated by [facility] for patients greater than or equal to 75 year old. It is not intended to diagnose infection nor to guide or monitor treatment.      Time coordinating discharge: Over 30 minutes  SIGNED:   Debbe Odea, MD  [facility] 09/24/2016, 11:49 AM Pager   If 7PM-7AM, please contact night-coverage [email or web address] Password [email or web address]

## 2016-09-24 NOTE — Care Management Note (Addendum)
Case Management Note  Patient Details  Name: Tracy Dodson MRN: [id number] Date of Birth: 1951/03/25  Subjective/Objective:                    Action/Plan:  PT recommending HHPT after working on stairs more .   Confirmed face sheet information with patient.   Patient already has walker , 3 in 1 , and wheel chair at home.   Patient does want hospital bed . Placed order once MD signs order [place] will arrange delivery.   Paged MD for home health orders.  Patient on Eliquis for 4 weeks . 30 day free card given and explained to patient.  Also gave and explained $10 co pay card to patient in case longer course needed.   Provided list of choice to patient . Patient stated she is not discharging home until tomorrow, she will discuss home health with her husband tonight and decide.   Patient stated she will need ambulance transportation home at time of discharge.    Patient voiced understanding to all of above.   Ambulance transportation papers in shadow chart with [place] phone numbers in case DC changes to today  Expected Discharge Date:   (unknown)               Expected Discharge Plan:  [place]  In-House Referral:     Discharge planning Services  CM Consult  Post Acute Care Choice:  Durable Medical Equipment, Home Health Choice offered to:  Patient  DME Arranged:  Hospital bed DME Agency:  [place]:  PT, OT [facility] Agency:     Status of Service:  In process, will continue to follow  If discussed at Long Length of Stay Meetings, dates discussed:    Additional Comments:  Marilu Favre, RN 09/24/2016, 4:11 PM

## 2016-09-24 NOTE — Progress Notes (Signed)
Occupational Therapy Treatment Patient Details Name: ANELA BENSMAN MRN: [id number] DOB: 1951-04-04 Today's Date: 09/24/2016    History of present illness Pt is a 65 yo female admitted through ED on 09/20/16 after a fall in her bathroom resulting in R hip intertrochanteric Fracture. Pt underwent ORIF on 09/21/16 and is PWB. PMH significant for lung cancer with recent metastasis to brain and bone and recently underwent chemo and radiation. pt is currently taking chemo PO medication.     OT comments  Pt progressing well toward OT goals. Able to sit at EOB for grooming tasks for approximately 5 minutes with fair sitting balance. Pt able to complete simulated toilet transfer to take approximately 5 steps to recliner with min assist. Pt remains hesitant with movement but willing and motivated to participate with therapy. OT will continue to follow acutely. D/C plan remains appropriate.   Follow Up Recommendations  SNF    Equipment Recommendations  None recommended by OT       Precautions / Restrictions Precautions Precautions: Fall Precaution Comments: s/p fall in bathroom Restrictions Weight Bearing Restrictions: Yes RLE Weight Bearing: Partial weight bearing       Mobility Bed Mobility Overal bed mobility: Needs Assistance Bed Mobility: Supine to Sit     Supine to sit: HOB elevated;Mod assist     General bed mobility comments: Mod A to bring LE's EOB with Mod A to bring trunk upright to EOB  Transfers Overall transfer level: Needs assistance Equipment used: Rolling walker (2 wheeled) Transfers: Sit to/from Stand Sit to Stand: Min assist              Balance Overall balance assessment: Needs assistance Sitting-balance support: No upper extremity supported;Feet supported Sitting balance-Leahy Scale: Fair Sitting balance - Comments: Sitting EOB no back support   Standing balance support: Bilateral upper extremity supported Standing balance-Leahy Scale:  Poor Standing balance comment: relies on Rw for support in standing                   ADL Overall ADL's : Needs assistance/impaired Eating/Feeding: Set up;Sitting   Grooming: Oral care;Set up;Sitting Grooming Details (indicate cue type and reason): Sat at EOB for approximately 10 minutes for oral care and brushing hair.                 Toilet Transfer: Minimal assistance;BSC;RW;Ambulation Armed forces technical officer Details (indicate cue type and reason): Able to take approximately 5 steps to recliner for simulated toilet transfer.         Functional mobility during ADLs: Minimal assistance;Rolling walker General ADL Comments: Pt able to perform functional mobility for a short distance of ambulation (approximately 5 steps) to recliner for simulated toilet transfer.      Vision                 Additional Comments: Wearing glasses   Perception     Praxis      Cognition   Behavior During Therapy: WFL for tasks assessed/performed Overall Cognitive Status: Within Functional Limits for tasks assessed                                    Pertinent Vitals/ Pain       Pain Assessment: Faces Faces Pain Scale: Hurts even more Pain Location: R hip; low back Pain Descriptors / Indicators: Aching;Grimacing;Sore Pain Intervention(s): Limited activity within patient's tolerance;Monitored during session;Repositioned;Ice applied  Frequency  Min 2X/week        Progress Toward Goals  OT Goals(current goals can now be found in the care plan section)  Progress towards OT goals: Progressing toward goals  Acute Rehab OT Goals Patient Stated Goal: to go to rehab then home OT Goal Formulation: With patient Time For Goal Achievement: 09/29/16 Potential to Achieve Goals: Good ADL Goals Pt Will Perform Lower Body Dressing: with min assist;sit to/from stand Pt Will Transfer to Toilet: with min assist;ambulating;regular height toilet;bedside commode Pt  Will Perform Toileting - Clothing Manipulation and hygiene: with min assist;sit to/from stand  Plan Discharge plan remains appropriate       End of Session Equipment Utilized During Treatment: Gait belt;Rolling walker   Activity Tolerance Patient tolerated treatment well   Patient Left in bed;with call bell/phone within reach   Nurse Communication Patient requests pain meds        Time: 6047-9987 OT Time Calculation (min): 32 min  Charges: OT General Charges $OT Visit: 1 Procedure OT Treatments $Self Care/Home Management : 23-37 mins  Norman Herrlich, OTR/L [phone] 09/24/2016, 9:15 AM

## 2016-09-24 NOTE — Clinical Social Work Note (Signed)
Pt's insurance is going to cover a small percentage of SNF placement. Pt is going to be responsible for a large percentage of bill, daily. CSW discussed this with pt. Pt reports she has a lot of support at home between her family and friends. Pt reports she has a lot of retired friends who can assist. Pt reports she will go home with home health, and in home nurse. CSW updated PT and PT will evaluate and follow up with CSW determining how the pt does.  [address] [place]

## 2016-09-25 ENCOUNTER — Ambulatory Visit: Payer: PRIVATE HEALTH INSURANCE

## 2016-09-25 ENCOUNTER — Encounter: Payer: Self-pay | Admitting: Radiation Oncology

## 2016-09-25 DIAGNOSIS — C7951 Secondary malignant neoplasm of bone: Secondary | ICD-10-CM

## 2016-09-25 DIAGNOSIS — S72141D Displaced intertrochanteric fracture of right femur, subsequent encounter for closed fracture with routine healing: Secondary | ICD-10-CM

## 2016-09-25 DIAGNOSIS — E440 Moderate protein-calorie malnutrition: Secondary | ICD-10-CM

## 2016-09-25 DIAGNOSIS — C7931 Secondary malignant neoplasm of brain: Secondary | ICD-10-CM

## 2016-09-25 NOTE — Progress Notes (Signed)
Patients 4th toe on the right foot is bruised but can move along with the other toes. Patient is concerned that it may be broken

## 2016-09-25 NOTE — Progress Notes (Signed)
Received voicemail message from patient's sister in law, Jose Persia. Unfortunately, Ms. Wynetta Emery, isn't listed on the patient's HIPPA form thus, I am not at liberty to disclose any information to her. Phoned patient in her hospital room and explained this. Encouraged her to add her sister in law to the HIPPA form the next time she is in our department. Explained [name] and Santiago Glad are out of the office the remainder of this week. Went on to explain that the kyphoplasty details will be determined by the orthopedic team. Patient verbalized understanding of all discussed. Hung up with patient and sister in law called back. Explained  Respectfully she would have to speak with the patient for further details as she is not listed on the patient's HIPPA forms.

## 2016-09-25 NOTE — Progress Notes (Signed)
Physical Therapy Treatment Patient Details Name: Tracy Dodson MRN: [id number] DOB: [birth year] Today's Date: 09/25/2016    History of Present Illness Pt is a 65 yo female admitted through ED on 09/20/16 after a fall in her bathroom resulting in R hip intertrochanteric Fracture. Pt underwent ORIF on 09/21/16 and is PWB. PMH significant for lung cancer with recent metastasis to brain and bone and recently underwent chemo and radiation. pt is currently taking chemo PO medication.      PT Comments    Patient limited by c/o pain this session. Still needs to progress with gait and practice stairs before d/c home. Continue to progress as tolerated.   Follow Up Recommendations  Home health PT;Supervision - Intermittent     Equipment Recommendations  Rolling walker with 5" wheels;3in1 (PT);Hospital bed;Other (comment) (pt questioning whether she needs hospital bed)    Recommendations for Other Services       Precautions / Restrictions Precautions Precautions: Fall Restrictions Weight Bearing Restrictions: Yes RLE Weight Bearing: Partial weight bearing RLE Partial Weight Bearing Percentage or Pounds: not specified    Mobility  Bed Mobility Overal bed mobility: Needs Assistance Bed Mobility: Sit to Supine       Sit to supine: Mod assist   General bed mobility comments: assist to bring R LE into bed and to scoot up in bed once supine with cues for technique and sequencing; use of rails and bed pad  Transfers Overall transfer level: Needs assistance Equipment used: Rolling walker (2 wheeled) Transfers: Sit to/from Stand Sit to Stand: Min assist         General transfer comment: assist to power up into standing and cues for hand placement  Ambulation/Gait Ambulation/Gait assistance: Min guard Ambulation Distance (Feet): 30 Feet Assistive device: Rolling walker (2 wheeled) Gait Pattern/deviations: Step-to pattern;Decreased stance time - right;Decreased step length -  left;Decreased weight shift to right Gait velocity: decreased   General Gait Details: min guard for safety; pt repeated "I just can't do this"; good adherence to WB precaution; cues for safety with use of AD and posture   Stairs            Wheelchair Mobility    Modified Rankin (Stroke Patients Only)       Balance Overall balance assessment: Needs assistance   Sitting balance-Leahy Scale: Good       Standing balance-Leahy Scale: Poor Standing balance comment: relies on Rw for support in standing                    Cognition Arousal/Alertness: Awake/alert Behavior During Therapy: WFL for tasks assessed/performed Overall Cognitive Status: Within Functional Limits for tasks assessed                      Exercises      General Comments General comments (skin integrity, edema, etc.): pt declined further mobility and sitting in chair due to pain      Pertinent Vitals/Pain Pain Assessment: 0-10 Pain Score: 7  Pain Location: R hip; low back Pain Descriptors / Indicators: Aching;Grimacing;Guarding;Moaning Pain Intervention(s): Limited activity within patient's tolerance;Monitored during session;Premedicated before session;Repositioned    Home Living                      Prior Function            PT Goals (current goals can now be found in the care plan section) Acute Rehab PT Goals Patient Stated Goal: less pain  Progress towards PT goals: Progressing toward goals    Frequency    Min 5X/week      PT Plan Discharge plan needs to be updated;Equipment recommendations need to be updated;Frequency needs to be updated    Co-evaluation             End of Session Equipment Utilized During Treatment: Gait belt Activity Tolerance: Patient limited by pain Patient left: with call bell/phone within reach;in bed     Time: 0900-0927 PT Time Calculation (min) (ACUTE ONLY): 27 min  Charges:  $Gait Training: 8-22 mins $Therapeutic  Activity: 8-22 mins                    G Codes:      Salina April, PTA Pager: [phone]   09/25/2016, 9:41 AM

## 2016-09-25 NOTE — Clinical Social Work Note (Signed)
Per pt's sister in law, who previously worked in Insurance underwriter, pt should have benefits through [employer] that will cover SNF. Pt's sister in law states pt has reached her out of pocket maximum however, pt's sister in law does understand that pt will have to pay out of pocket following the new year. Pt's sister in law provided CSW with contact to reach pt's case manager at [employer]. [place] spoke with Maudie Mercury at [facility] [phone], ext. [phone]) and a facility placement in [place] will be out of network but [employer] will negotiate with the facilities and the facilities will still get paid at in network range. Pt is considering [facility], [facility], [facility], or [facility]. CSW will reach out to facilities. Per Maudie Mercury CSW can provide facilities with her number in order to discuss. CSW will relay this information.   [address], [place]

## 2016-09-25 NOTE — Care Management Note (Signed)
Case Management Note  Patient Details  Name: Tracy Dodson MRN: [id number] Date of Birth: [birth year]  Subjective/Objective:   Spoke with pt who confirms [facility] as her choice for [facility] provider if SNF not an option as CSW is having difficulty finding facilities in network with [place]. Pt is aware that she is medically ready for discharge. MD is consulting with IR to see if Kyphoplasty is an option prior to discharge. Will continue to follow.                  Action/Plan: Anticipate discharge  today.   Expected Discharge Date:   (unknown)               Expected Discharge Plan:  [place]  In-House Referral:     Discharge planning Services  CM Consult  Post Acute Care Choice:  Durable Medical Equipment, Home Health (has RW, WC and 3n1 ) Choice offered to:  Patient  DME Arranged:  Hospital bed DME Agency:  [place]:  PT, OT [facility] Agency:  [place]  Status of Service:  In process, will continue to follow  If discussed at Long Length of Stay Meetings, dates discussed:    Additional Comments:  Delrae Sawyers, RN 09/25/2016, 10:07 AM

## 2016-09-25 NOTE — Progress Notes (Addendum)
Physical Therapy Treatment Patient Details Name: Tracy Dodson MRN: [id number] DOB: [birth year] Today's Date: 09/25/2016    History of Present Illness Pt is a 65 yo female admitted through ED on 09/20/16 after a fall in her bathroom resulting in R hip intertrochanteric Fracture. Pt underwent ORIF on 09/21/16 and is PWB. PMH significant for lung cancer with recent metastasis to brain and bone and recently underwent chemo and radiation. pt is currently taking chemo PO medication.      PT Comments    Patient is progressing gradually toward mobility goals. Pt with history of falls, generalized weakness, and significant amount of stairs to reach bedroom/bathroom in home. Recommending [facility]-SNF for further skilled PT services to maximize independence and safety with mobility.   Follow Up Recommendations  SNF     Equipment Recommendations  Rolling walker with 5" wheels    Recommendations for Other Services       Precautions / Restrictions Precautions Precautions: Fall Restrictions Weight Bearing Restrictions: Yes RLE Weight Bearing: Partial weight bearing RLE Partial Weight Bearing Percentage or Pounds: not specified    Mobility  Bed Mobility               General bed mobility comments: pt on BSC with RN present upon arrival  Transfers Overall transfer level: Needs assistance Equipment used: Rolling walker (2 wheeled) Transfers: Sit to/from Stand Sit to Stand: Min assist         General transfer comment: assist to stand with cues for safe hand placement from [facility]  Ambulation/Gait Ambulation/Gait assistance: Min guard Ambulation Distance (Feet): 60 Feet Assistive device: Rolling walker (2 wheeled) Gait Pattern/deviations: Step-to pattern;Decreased stance time - right;Decreased step length - left;Decreased weight shift to right Gait velocity: decreased   General Gait Details: several standing rest breaks required; cues for gait velocity, safety, and WB  status   Stairs            Wheelchair Mobility    Modified Rankin (Stroke Patients Only)       Balance Overall balance assessment: Needs assistance   Sitting balance-Leahy Scale: Good       Standing balance-Leahy Scale: Poor Standing balance comment: relies on Rw for support in standing                    Cognition Arousal/Alertness: Awake/alert Behavior During Therapy: WFL for tasks assessed/performed Overall Cognitive Status: Within Functional Limits for tasks assessed                      Exercises Total Joint Exercises Quad Sets: AROM;Both;10 reps Gluteal Sets: AROM;Both;10 reps Heel Slides: AAROM;Right;10 reps Hip ABduction/ADduction: AAROM;Right;10 reps Long Arc Quad: AROM;Right;10 reps    General Comments        Pertinent Vitals/Pain Pain Assessment: Faces Faces Pain Scale: Hurts little more Pain Location: R hip and back (mainly back) Pain Descriptors / Indicators: Grimacing;Guarding;Moaning;Sore Pain Intervention(s): Limited activity within patient's tolerance;Monitored during session;Premedicated before session;Repositioned    Home Living                      Prior Function            PT Goals (current goals can now be found in the care plan section) Acute Rehab PT Goals Patient Stated Goal: go to rehab Progress towards PT goals: Progressing toward goals    Frequency    Min 5X/week      PT Plan Discharge plan needs to be  updated;Frequency needs to be updated    Co-evaluation             End of Session Equipment Utilized During Treatment: Gait belt Activity Tolerance: Patient tolerated treatment well Patient left: with call bell/phone within reach;in chair     Time: 1530-1600 PT Time Calculation (min) (ACUTE ONLY): 30 min  Charges:  $Gait Training: 8-22 mins $Therapeutic Exercise: 8-22 mins                    G Codes:      Sal[place] April, PTA Pager: [phone]   09/25/2016, 4:26 PM
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
## 2016-09-25 NOTE — Progress Notes (Signed)
PROGRESS NOTE    Tracy Dodson  DGL:[id number] DOB: 1951-03-01 DOA: 09/20/2016  PCP: [name], MD   Brief Narrative:  Tracy Dodson is a 65 y.o. 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 She had a CT done as outpt recently (not available in EPIC yet) and was called by her radiation oncologist while in the ER and told that she may have a hairline fracture in the right hip based on that CT. Bone scan on 12/14 which I am able to review in EPIC also showed increased activity in the right hip.  S/p IM nail 12/23 and post op anemia  Subjective: Feels ok, has low back pain and wants to have kyphoplasty done if possible this was being scheduled per Dr.Kinard  Assessment & Plan:   Principal Problem:   Intertrochanteric fracture of right hip -  12/23-  Intramedullary nail, post op with anemia otherwise stable -  PT following -  Ortho instructions: Continue on Eliquis x 4 weeks for DVT prevention-now held for eval for Kyphoplasty Partial weightbearing right leg Follow up in office 2 weeks post op.  Acute blood loss anemia - due to fracture Hb 8.2 >> 6.1 - transfused 2 U RPBC 12/24 - Anemia panel shows normal ferretin, B12, Folate and elevated retic count  Non-small cell lung cancer recurrent with brain metastasis (resected) and bone metastasis  - on Tagrisso per Dr Julien Nordmann - T10 met vs vertebral compression fx on recent bone scan - right hip met vs fracture (need to review recent CT) but uptake on bone scan can be also due to a fracture- xrays show no metastasis - Dr Sondra Come was considering palliative radiation to right hip and he suspected T10 lesion to be benign- he planned on consulting IR for kyphoplasty - cont Prednisone - will ask IR to evaluate if Kyphoplasty for T10 fracture  is an  option  Sinus tachycardia- abnormal TFTs - persistent -currently has no pain or fever to be contributing to this - is not dehydrated, no s/s of PE - does not feel any palpitations - Free T4 mildly elevated (1.41) and TSH normal (1.002) - recommend repeat in 1- 2 wks (as this could be sick euthyroid) to confirm accuracy and speak with Endo before starting treatment - other than tachycardia, no other signs of hyperthyroidism at this time  Thrombocytopenia - appears to be chronic    DVT prophylaxis: Eliquis Code Status: Full code Family Communication: husband Disposition Plan: likely SNF- social work consulted Consultants:   ortho Procedures:   R  Intramedullary nail    Antimicrobials:  Anti-infectives    Start     Dose/Rate Route Frequency Ordered Stop   09/21/16 1700  ceFAZolin (ANCEF) IVPB 2g/100 mL premix     2 g 200 mL/hr over 30 Minutes Intravenous Every 6 hours 09/21/16 1501 09/22/16 0007   09/21/16 1100  ceFAZolin (ANCEF) IVPB 2g/100 mL premix     2 g 200 mL/hr over 30 Minutes Intravenous  Once 09/21/16 1047 09/21/16 1141   09/21/16 1048  ceFAZolin (ANCEF) 2-4 GM/100ML-% IVPB    Comments:  Henrine Screws   : cabinet override      09/21/16 1048 09/21/16 1111       Objective: Vitals:   09/24/16 1359 09/24/16 1940 09/25/16 0445 09/25/16 1413  BP: (!) 120/51 [name])  110/52 136/71 115/62  Pulse: (!) 113 (!) 108 (!) 105 (!) 104  Resp: '16 16 16 16  '$ Temp: 99.8 F (37.7 C) 98.8 F (37.1 C) 98.7 F (37.1 C) 98.3 F (36.8 C)  TempSrc:  Oral Oral Oral  SpO2: 95% 96% 98% 97%  Weight:      Height:        Intake/Output Summary (Last 24 hours) at 09/25/16 1440 Last data filed at 09/25/16 1300  Gross per 24 hour  Intake              600 ml  Output                0 ml  Net              600 ml   Filed Weights   09/20/16 0830  Weight: 60.3 kg (133 lb)    Examination: General exam: AAOx3, no distress, sitting in chair HEENT: PERRLA, oral mucosa moist, no sclera  icterus or thrush Respiratory system: Clear to auscultation. Respiratory effort normal. Cardiovascular system: S1 & S2 heard, RRR.  No murmurs - tachycardic Gastrointestinal system: Abdomen soft, non-tender, nondistended. Normal bowel sound Central nervous system: Alert and oriented. No focal neurological deficits. Extremities: No cyanosis, clubbing or edema Skin: No rashes or ulcers Psychiatry:  Mood & affect appropriate.     Data Reviewed: I have personally reviewed following labs and imaging studies  CBC:  Recent Labs Lab 09/20/16 0822  09/21/16 0940 09/22/16 0632 09/23/16 0502 09/24/16 0349 09/24/16 1118  WBC 9.1  < > 8.2 8.9 9.0 5.3 6.0  NEUTROABS 4.6  --   --   --   --   --   --   HGB 9.5*  < > 8.2* 6.1* 9.3* 8.3* 8.7*  HCT 30.8*  < > 27.9* 20.7* 29.0* 26.5* 28.1*  MCV 86.5  < > 91.5 89.2 85.0 88.0 88.6  PLT 76*  < > 64* 52* 41* 41* 36*  < > = values in this interval not displayed. Basic Metabolic Panel:  Recent Labs Lab 09/20/16 0822 09/21/16 0558 09/22/16 0632 09/23/16 0502 09/24/16 0349  NA 137 138 135 134* 136  K 3.4* 4.3 4.0 3.7 3.8  CL 101 105 102 98* 101  CO2 '24 27 26 31 27  '$ GLUCOSE 142* 137* 110* 101* 97  BUN '7 6 7 6 6  '$ CREATININE 0.88 0.74 0.61 0.51 0.47  CALCIUM 9.4 8.7* 8.5* 8.6* 8.6*   GFR: Estimated Creatinine Clearance: 64.8 mL/min (by C-G formula based on SCr of 0.47 mg/dL). Liver Function Tests: No results for input(s): AST, ALT, ALKPHOS, BILITOT, PROT, ALBUMIN in the last 168 hours. No results for input(s): LIPASE, AMYLASE in the last 168 hours. No results for input(s): AMMONIA in the last 168 hours. Coagulation Profile:  Recent Labs Lab 09/20/16 0822  INR 1.16   Cardiac Enzymes: No results for input(s): CKTOTAL, CKMB, CKMBINDEX, TROPONINI in the last 168 hours. BNP (last 3 results) No results for input(s): PROBNP in the last 8760 hours. HbA1C: No results for input(s): HGBA1C in the last 72 hours. CBG: No results for input(s):  GLUCAP in the last 168 hours. Lipid Profile: No results for input(s): CHOL, HDL, LDLCALC, TRIG, CHOLHDL, LDLDIRECT in the last 72 hours. Thyroid Function Tests:  Recent Labs  09/23/16 0912  TSH 1.002  FREET4 1.41*   Anemia Panel: No results for input(s): VITAMINB12, FOLATE, FERRITIN, TIBC, IRON, RETICCTPCT in the last 72 hours. Urine analysis:    Component  Value Date/Time   COLORURINE YELLOW 02/04/2013 [place] 02/04/2013 1033   LABSPEC 1.007 02/04/2013 1033   PHURINE 7.0 02/04/2013 [place] 02/04/2013 1033   HGBUR NEGATIVE 02/04/2013 [place] 02/04/2013 1033   KETONESUR NEGATIVE 02/04/2013 1033   PROTEINUR NEGATIVE 02/04/2013 1033   UROBILINOGEN 0.2 02/04/2013 1033   NITRITE NEGATIVE 02/04/2013 1033   LEUKOCYTESUR TRACE (A) 02/04/2013 1033   Sepsis Labs: '@LABRCNTIP'$ (procalcitonin:4,lacticidven:4) ) Recent Results (from the past 240 hour(s))  Surgical PCR screen     Status: None   Collection Time: 09/21/16  1:12 AM  Result Value Ref Range Status   MRSA, PCR NEGATIVE NEGATIVE Final   Staphylococcus aureus NEGATIVE NEGATIVE Final    Comment:        The Xpert SA Assay (FDA approved for NASAL specimens in patients over 17 years of age), is one component of a comprehensive surveillance program.  Test performance has been validated by [facility] for patients greater than or equal to 23 year old. It is not intended to diagnose infection nor to guide or monitor treatment.          Radiology Studies: No results found.    Scheduled Meds: . cholecalciferol  2,000 Units Oral Daily  . citalopram  20 mg Oral Daily  . diphenhydrAMINE  25 mg Oral QHS  . famotidine  20 mg Oral BID  . feeding supplement (ENSURE ENLIVE)  237 mL Oral BID BM  . feeding supplement (PRO-STAT SUGAR FREE 64)  30 mL Oral Daily  . multivitamin with minerals  1 tablet Oral Daily  . osimertinib mesylate  80 mg Oral Daily  . oxyCODONE  20 mg  Oral Q12H  . predniSONE  2 mg Oral Q supper  . predniSONE  3 mg Oral Q breakfast   Continuous Infusions:    LOS: 5 days    Time spent in minutes: 35    Nallely Yost, MD [facility] Pager: [email or web address] Password [email or web address] 09/25/2016, 2:40 PM

## 2016-09-26 ENCOUNTER — Encounter: Payer: Self-pay | Admitting: Radiation Oncology

## 2016-09-26 ENCOUNTER — Ambulatory Visit: Payer: PRIVATE HEALTH INSURANCE

## 2016-09-26 LAB — CBC
HCT: 28.1 % — ABNORMAL LOW (ref 36.0–46.0)
Hemoglobin: 8.5 g/dL — ABNORMAL LOW (ref 12.0–15.0)
MCH: 27.3 pg (ref 26.0–34.0)
MCHC: 30.2 g/dL (ref 30.0–36.0)
MCV: 90.4 fL (ref 78.0–100.0)
PLATELETS: 55 10*3/uL — AB (ref 150–400)
RBC: 3.11 MIL/uL — AB (ref 3.87–5.11)
RDW: 18 % — ABNORMAL HIGH (ref 11.5–15.5)
WBC: 4 10*3/uL (ref 4.0–10.5)

## 2016-09-26 LAB — BASIC METABOLIC PANEL
ANION GAP: 6 (ref 5–15)
BUN: 9 mg/dL (ref 6–20)
CO2: 30 mmol/L (ref 22–32)
Calcium: 8.8 mg/dL — ABNORMAL LOW (ref 8.9–10.3)
Chloride: 100 mmol/L — ABNORMAL LOW (ref 101–111)
Creatinine, Ser: 0.53 mg/dL (ref 0.44–1.00)
GFR calc Af Amer: >60 mL/min (ref >60)
Glucose, Bld: 85 mg/dL (ref 65–99)
POTASSIUM: 3.9 mmol/L (ref 3.5–5.1)
SODIUM: 136 mmol/L (ref 135–145)

## 2016-09-26 MED ORDER — HEPARIN SOD (PORK) LOCK FLUSH 100 UNIT/ML IV SOLN
500.0000 [IU] | INTRAVENOUS | Status: AC | PRN
  Administered 2016-09-26: 500 [IU]

## 2016-09-26 MED ORDER — APIXABAN 2.5 MG PO TABS
2.5000 mg | ORAL_TABLET | Freq: Two times a day (BID) | ORAL | Status: DC
  Administered 2016-09-26: 2.5 mg via ORAL
  Filled 2016-09-26: qty 1

## 2016-09-26 MED ORDER — MORPHINE SULFATE (PF) 2 MG/ML IV SOLN
2.0000 mg | Freq: Once | INTRAVENOUS | Status: AC
  Administered 2016-09-26: 2 mg via INTRAVENOUS
  Filled 2016-09-26: qty 1

## 2016-09-26 MED ORDER — POLYETHYLENE GLYCOL 3350 17 G PO PACK: 17.0000 g | PACK | Freq: Every day | ORAL | 0 refills | Status: AC | PRN

## 2016-09-26 MED ORDER — OXYCODONE HCL ER 20 MG PO T12A: 40.0000 mg | EXTENDED_RELEASE_TABLET | Freq: Two times a day (BID) | ORAL | Status: DC

## 2016-09-26 MED ORDER — OXYCODONE HCL ER 20 MG PO T12A: 40.0000 mg | EXTENDED_RELEASE_TABLET | Freq: Two times a day (BID) | ORAL | 0 refills | Status: AC

## 2016-09-26 MED ORDER — OXYCODONE HCL ER 20 MG PO T12A
20.0000 mg | EXTENDED_RELEASE_TABLET | Freq: Two times a day (BID) | ORAL | Status: AC
  Administered 2016-09-26: 20 mg via ORAL
  Filled 2016-09-26: qty 1

## 2016-09-26 NOTE — Telephone Encounter (Signed)
[place] patient.

## 2016-09-26 NOTE — Clinical Social Work Note (Signed)
Clinical Social Work Assessment  Patient Details  Name: Tracy Dodson MRN: [id number] Date of Birth: 09/06/1951  Date of referral:  09/23/16               Reason for consult:  Facility Placement                Permission sought to share information with:    Permission granted to share information::     Name::        Agency::     Relationship::     Contact Information:     Housing/Transportation Living arrangements for the past 2 months:  Single Family Home Source of Information:  Patient Patient Interpreter Needed:  None Criminal Activity/Legal Involvement Pertinent to Current Situation/Hospitalization:  No - Comment as needed Significant Relationships:  Spouse, Other Family Members Lives with:  Spouse Do you feel safe going back to the place where you live?  Yes Need for family participation in patient care:  Yes (Comment)  Care giving concerns:  Pt's sister in law present at bedside during initial assessment. Pt is alert and orriented. No care giving concerns at this time.    Social Worker assessment / plan:  CSW spoke with pt at bedside to complete initial assessment. Pt lives at home with her spouse. Pt is alert and oriented. Pt is agreeable to SNF placement at this time however, pt has [place] will have to reach out to facilities and see which facilities are in network with this insurance provider. CSW explained to pt that if no facilities are in network she will have to pay out of pocket. Pt reports if that is the case she will not be able to and may have to go home with home health. Pt reports she has a lot of support at home.  CSW will follow up with pt.  Employment status:  Retired Forensic scientist:  Other (Comment Required) Environmental education officer) PT Recommendations:  [place] / Referral to community resources:  [place]  Patient/Family's Response to care:  Pt verbalized understanding of CSW role and appreciation of support. Pt  denies any concern regarding pt care at this time.   Patient/Family's Understanding of and Emotional Response to Diagnosis, Current Treatment, and Prognosis:  Pt understand and realistic regarding physical limitations. Currently pt understands insurance barrier. CSW has spoken with pt about options. Pt is agreeable to SNF placement at this time. Pt denies any concern regarding treatment plan at this time. CSW will continue to provide support to pt.  Emotional Assessment Appearance:  Appears stated age Attitude/Demeanor/Rapport:   (Patient was appropriate.) Affect (typically observed):  Accepting, Appropriate, Calm, Pleasant Orientation:  Oriented to Self, Oriented to Place, Oriented to  Time, Oriented to Situation Alcohol / Substance use:  Not Applicable Psych involvement (Current and /or in the community):  No (Comment)  Discharge Needs  Concerns to be addressed:  No discharge needs identified Readmission within the last 30 days:  No Current discharge risk:  Dependent with Mobility Barriers to Discharge:  Continued Medical Work up, [employer], LCSW 09/26/2016, 1:55 PM

## 2016-09-26 NOTE — Progress Notes (Signed)
Jacquenette Shone, RN called RN Jerene Dilling back for report.

## 2016-09-26 NOTE — Clinical Social Work Placement (Signed)
   CLINICAL SOCIAL WORK PLACEMENT  NOTE  Date:  09/26/2016  Patient Details  Name: Tracy Dodson MRN: [id number] Date of Birth: [birth year]  Clinical Social Work is seeking post-discharge placement for this patient at the [place] level of care (*CSW will initial, date and re-position this form in  chart as items are completed):      Patient/family provided with [place] Work Department's list of facilities offering this level of care within the geographic area requested by the patient (or if unable, by the patient's family).      Patient/family informed of their freedom to choose among providers that offer the needed level of care, that participate in Medicare, Medicaid or managed care program needed by the patient, have an available bed and are willing to accept the patient.      Patient/family informed of [facility]'s ownership interest in [facility] and [facility], as well as of the fact that they are under no obligation to receive care at these facilities.  PASRR submitted to EDS on 09/23/16     PASRR number received on 09/23/16     Existing PASRR number confirmed on       FL2 transmitted to all facilities in geographic area requested by pt/family on 09/23/16     FL2 transmitted to all facilities within larger geographic area on       Patient informed that his/her managed care company has contracts with or will negotiate with certain facilities, including the following:        Yes   Patient/family informed of bed offers received.  Patient chooses bed at [facility]     Physician recommends and patient chooses bed at      Patient to be transferred to [facility] on 09/26/16.  Patient to be transferred to facility by [facility]     Patient family notified on 09/26/16 of transfer.  Name of family member notified:  Patient     PHYSICIAN       Additional Comment:     _______________________________________________ Alla German, LCSW 09/26/2016, 1:41 PM

## 2016-09-26 NOTE — Progress Notes (Signed)
RN attempted to call report three different times, phones at facility kept hanging up.

## 2016-09-26 NOTE — Progress Notes (Signed)
Physical Therapy Treatment Patient Details Name: Tracy Dodson MRN: [id number] DOB: January 01, 1951 Today's Date: 09/26/2016    History of Present Illness Pt is a 65 yo female admitted through ED on 09/20/16 after a fall in her bathroom resulting in R hip intertrochanteric Fracture. Pt underwent ORIF on 09/21/16 and is PWB. PMH significant for lung cancer with recent metastasis to brain and bone and recently underwent chemo and radiation. pt is currently taking chemo PO medication.      PT Comments    Patient limited by pain this session and tolerated short gait distance and bilat LE therex. Pt continues to have decreased activity tolerance and generalized weakness. Encouraged pt to spend more time OOB in chair during the day and to ambulate to bathroom with nursing staff when needed to increase activity. Continue to progress as tolerated with anticipated d/c to SNF for further skilled PT services.    Follow Up Recommendations  SNF     Equipment Recommendations  Rolling walker with 5" wheels    Recommendations for Other Services       Precautions / Restrictions Precautions Precautions: Fall Restrictions Weight Bearing Restrictions: Yes RLE Weight Bearing: Partial weight bearing RLE Partial Weight Bearing Percentage or Pounds: not specified    Mobility  Bed Mobility Overal bed mobility: Needs Assistance Bed Mobility: Supine to Sit     Supine to sit: Min guard     General bed mobility comments: cues for technique for decreased pain  Transfers Overall transfer level: Needs assistance Equipment used: Rolling walker (2 wheeled) Transfers: Sit to/from Stand Sit to Stand: Min assist         General transfer comment: from EOB and BSC; assist to power up into standing with cues for safe hand placement and technique to maintain WB precaution  Ambulation/Gait Ambulation/Gait assistance: Min guard Ambulation Distance (Feet):  (74f X2) Assistive device: Rolling walker (2  wheeled) Gait Pattern/deviations: Step-to pattern;Decreased stance time - right;Decreased step length - left;Decreased weight shift to right Gait velocity: decreased   General Gait Details: cues for posture and sequencing   Stairs            Wheelchair Mobility    Modified Rankin (Stroke Patients Only)       Balance Overall balance assessment: Needs assistance   Sitting balance-Leahy Scale: Good       Standing balance-Leahy Scale: Poor Standing balance comment: relies on Rw for support in standing                    Cognition Arousal/Alertness: Awake/alert Behavior During Therapy: WFL for tasks assessed/performed Overall Cognitive Status: Within Functional Limits for tasks assessed                      Exercises Total Joint Exercises Quad Sets: AROM;Both;10 reps Heel Slides: AAROM;Right;10 reps Hip ABduction/ADduction: AAROM;Right;10 reps Long Arc Quad: AROM;Right;10 reps Marching in Standing: AROM;Both;10 reps;Seated    General Comments        Pertinent Vitals/Pain Pain Assessment: Faces Faces Pain Scale: Hurts even more Pain Location: R hip and back (mainly back) Pain Descriptors / Indicators: Grimacing;Guarding;Moaning;Sore Pain Intervention(s): Limited activity within patient's tolerance;Monitored during session;Premedicated before session;Repositioned    Home Living                      Prior Function            PT Goals (current goals can now be found in the care  plan section) Acute Rehab PT Goals Patient Stated Goal: go to rehab Progress towards PT goals: Progressing toward goals    Frequency    Min 5X/week      PT Plan Current plan remains appropriate    Co-evaluation             End of Session Equipment Utilized During Treatment: Gait belt Activity Tolerance: Patient tolerated treatment well Patient left: with call bell/phone within reach;in chair     Time: 5830-9407 PT Time Calculation (min)  (ACUTE ONLY): 23 min  Charges:  $Gait Training: 8-22 mins $Therapeutic Exercise: 8-22 mins                    G Codes:      [place] April, PTA Pager: [phone]   09/26/2016, 1:09 PM

## 2016-09-26 NOTE — Discharge Summary (Addendum)
Physician Discharge Summary  Tracy Dodson IEP:[id number] DOB: 1951/08/19 DOA: 09/20/2016  PCP: Tracy Crutch, MD  Admit date: 09/20/2016 Discharge date: 09/26/2016  Admitted From: home   Disposition: home with home health   Recommendations for Outpatient Follow-up:  1. recommend repeat Thyroid function tests in 1- 2 wks to confirm accuracy and speak with Endo before starting treatment 2. Due to chronic thrombocytopenia and use of Apixaban, follow closely for bleeding, stop apixaban after 4weeks for DVT prophylaxis following Hip fracture 3. Ortho Tracy Dodson in 1-2weeks 4. Interventional Radiology for T10 kyphoplasty-office will call once scheduled     Discharge Condition:  stable   CODE STATUS:  Full code   Diet recommendation:  Low sodium, heart helthy Consultations:  oncology   Discharge Diagnoses:  R. 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 She had a CT done as outpt recently (not available in EPIC yet) and was called by her radiation oncologist while in the ER and told that she may have a hairline fracture in the right hip based on that CT. Bone scan on 12/14 which I am able to review in EPIC also showed increased activity in the right hip.   Hospital Course:     Intertrochanteric fracture of right hip -s/p mechanical fall -Ortho consulted  12/23-  Intramedullary nail by Tracy Dodson -Ortho instructions: Continue on Eliquis x 4 weeks for DVT prevention. Partial weightbearing  right leg Follow up in office 2 weeks post op. -SNF for rehab   Acute blood loss anemia - post op blood loss/hemodilution -  Hb 8.2 >> 6.1- transfused 2 U RPBC 12/24 bringing Hb back up to 8-9 range - Anemia panel shows normal ferretin, B12, Folate and elevated retic count  Non-small cell lung cancer recurrent with brain metastasis (resected) and bone metastasis  - on Tagrisso per Tracy Dodson - T10 vertebral compression fx on recent bone scan - right hip likely pathological fracture - Tracy Dodson was considering palliative radiation to right hip and he suspected T10 lesion to be benign- he planned on consult for kyphoplasty- she has had no back pain  - cont Prednisone -IR consulted, seen by Tracy Dodson, they will schedule this as outpatient  Sinus tachycardia- abnormal TFTs - persistent mild tachycardia in low 100s with no underlying physiologic cause - does not feel any palpitations - F T4 mildly elevated (1.41) and TSH normal (1.002) - recommend repeat in 1- 2 wks (as this could be sick euthyroid) to confirm accuracy and speak with Endo before starting treatment - other than tachycardia, no other signs of hyperthyroidism at this time, no s/s of PE at this time either  Thrombocytopenia - appears to be chronic- follow closely for bleeding on Apixaban   Discharge Instructions  Discharge Instructions    Diet - low sodium heart healthy    Complete by:  As directed    Diet - low sodium heart healthy    Complete by:  As directed    Increase activity slowly    Complete by:  As directed    Increase activity slowly  Complete by:  As directed    Partial weight bearing    Complete by:  As directed      Allergies as of 09/26/2016      Reactions   Compazine [prochlorperazine Edisylate] Other (See Comments)   Mental status changes -confusion    Contrast Media [iodinated Diagnostic Agents] Hives, Rash   Looked like I had the measles. Red spotted rash. Pt.states from PET  scan Per pt ---no allergy to IV contrast media  02/29/16      Medication List    STOP taking these medications   LORazepam 0.5 MG tablet Commonly known as:  ATIVAN   OVER THE COUNTER MEDICATION     TAKE these medications   acetaminophen 325 MG tablet Commonly known as:  TYLENOL Take 325 mg by mouth every 6 (six) hours as needed for moderate pain or headache.   apixaban 2.5 MG Tabs tablet Commonly known as:  ELIQUIS Take 1 tablet (2.5 mg total) by mouth 2 (two) times daily.   CENTRUM SILVER PO Take 1 capsule by mouth daily.   cholecalciferol 1000 units tablet Commonly known as:  VITAMIN D Take 2,000 Units by mouth daily.   citalopram 20 MG tablet Commonly known as:  CELEXA Take '20mg'$  by mouth once daily   doxylamine (Sleep) 25 MG tablet Commonly known as:  UNISOM Take 1 tablet (25 mg total) by mouth at bedtime.   HYDROcodone-acetaminophen 5-325 MG tablet Commonly known as:  NORCO/VICODIN Take 1-2 tablets by mouth every 3 (three) hours as needed for moderate pain (may take 2 tablets every 3-4 hours prn for back pain). What changed:  how much to take   oxyCODONE 20 mg 12 hr tablet Commonly known as:  OXYCONTIN Take 2 tablets (40 mg total) by mouth every 12 (twelve) hours. What changed:  how much to take   pantoprazole 40 MG tablet Commonly known as:  PROTONIX Take 1 tablet (40 mg total) by mouth daily. Switch for any other PPI at similar dose and frequency What changed:  additional instructions   polyethylene glycol packet Commonly known as:  MIRALAX / GLYCOLAX Take 17 g by mouth daily as needed for mild constipation.   predniSONE 1 MG tablet Commonly known as:  DELTASONE Take 2-3 mg by mouth 2 (two) times daily with a meal. Take '3mg'$  in the am, and '2mg'$  at night for a total of '5mg'$  qd.[name]   PREVIDENT 5000 DRY MOUTH 1.1 % Gel dental gel Generic drug:  sodium fluoride Place 1 application onto teeth daily as needed (sensitive toothpaste).   TAGRISSO 80 MG  tablet Generic drug:  osimertinib mesylate TAKE 1 TABLET BY MOUTH ONCE DAILY What changed:  See the new instructions.            Durable Medical Equipment        Start     Ordered   09/24/16 1609  For home use only DME Hospital bed  Once    Comments:  Call patient's cell phone [phone] to arrange delivery today, expected discharge is tomorrow 09-25-16 Thanks  Question Answer Comment  Patient has (list medical condition): INTRAMEDULLARY (IM) NAIL INTERTROCHANTRIC (Right)   The above medical condition requires: Patient requires the ability to reposition frequently   Head must be elevated greater than: 45 degrees   Bed type Semi-electric      09/24/16 1609     Follow-up Information    DALLDORF,PETER G, MD. Schedule an appointment as soon as possible for a visit  in 2 week(s).   Specialty:  Orthopedic Surgery Contact information: [place] [address] [phone]        Tracy Crutch, MD. Schedule an appointment as soon as possible for a visit in 1 week(s).   Specialty:  Family Medicine Why:  your thyroid functions should he checked again Contact information: [place] [address] [phone]          Allergies  Allergen Reactions  . Compazine [Prochlorperazine Edisylate] Other (See Comments)    Mental status changes -confusion   . Contrast Media [Iodinated Diagnostic Agents] Hives and Rash    Looked like I had the measles. Red spotted rash. Pt.states from PET scan Per pt ---no allergy to IV contrast media  02/29/16     Procedures/Studies: 12/23- IM nail R hip  Dg Chest 1 View  Result Date: 09/20/2016 CLINICAL DATA:  65 year old female with history of trauma from a fall at home. History of lung cancer. EXAM: CHEST 1 VIEW COMPARISON:  Chest x-ray 08/30/2016. FINDINGS: Known left upper lobe and right lower lobe nodules are not well demonstrated on today's chest radiograph, which could indicate positive response to therapy, or  could partially be related to decreased sensitivity of chest radiographs. No acute consolidative airspace disease. No pleural effusions. Chronic scarring in the right lung base is unchanged. No pneumothorax. No evidence of pulmonary edema. Heart size is normal. The patient is rotated to the left on today's exam, resulting in distortion of the mediastinal contours and reduced diagnostic sensitivity and specificity for mediastinal pathology. Visualized bony thorax appears grossly intact. Right internal jugular single-lumen power porta cath with tip terminating in the distal superior vena cava. IMPRESSION: 1. No evidence of significant acute traumatic injury to the thorax. 2. Known left upper lobe and right lower lobe pulmonary nodules are poorly demonstrated on today's chest radiograph. Electronically Signed   By: Vinnie Langton M.D.   On: 09/20/2016 08:57   Dg Thoracic Spine W/swimmers  Result Date: 08/30/2016 CLINICAL DATA:  Fall backwards onto radiation therapy table 3 days ago. Initial encounter EXAM: THORACIC SPINE - 3 VIEWS COMPARISON:  Body CT 07/19/2016 FINDINGS: Lower thoracic compression fracture with moderate height loss, new from 07/19/2016. The fracture is likely at the T10 level. Bilateral pulmonary nodules, known. Exaggerated thoracic kyphosis and generalized disc narrowing. IMPRESSION: Lower thoracic (likely T10) moderate compression fracture that is acute or subacute based development since 07/19/2016 CT. Electronically Signed   By: Monte Fantasia M.D.   On: 08/30/2016 09:50   Ct Thoracic Spine Wo Contrast  Result Date: 08/30/2016 CLINICAL DATA:  Acute mid back pain.  History of lung cancer. EXAM: CT THORACIC SPINE WITHOUT CONTRAST TECHNIQUE: Multidetector CT images of the thoracic were obtained using the standard protocol without intravenous contrast. COMPARISON:  CT scan of July 19, 2016.  Radiographs of same day. FINDINGS: Alignment: No spondylolisthesis is noted. Vertebrae: There is  interval development of moderate compression deformity of T10 vertebral body concerning for acute or subacute fracture. There is no retropulsion of fragments into spinal canal. Paraspinal and other soft tissues: Irregular left upper lobe mass is significantly increased in size, measuring 2.4 x 2.1 cm currently. Stable 3.0 x 2.5 cm right lower lobe mass is noted. No significant central spinal canal stenosis is noted. Disc levels: Multilevel degenerative disc disease is noted in the lower thoracic spine. IMPRESSION: Left upper lobe irregular mass is increased in size compared to prior exam, concerning for worsening malignancy. Stable right lower lobe  mass is noted also concerning for malignancy or metastatic disease. Interval development of moderate compression deformity of T10 vertebral body concerning for acute or subacute fracture. Given the history of malignancy, metastatic disease cannot be excluded, although no significant lytic destruction is seen at this time. MRI may be performed for further evaluation. These results will be called to the ordering clinician or representative by the Radiologist Assistant, and communication documented in the PACS or zVision Dashboard. Electronically Signed   By: Marijo Conception, M.D.   On: 08/30/2016 13:11   Mr Thoracic Spine W Wo Contrast  Result Date: 09/08/2016 CLINICAL DATA:  Weakness in the back in the right leg. Pain in the back, right leg, and shoulder. Bowel and bladder habit changes. Right middle lobe lung cancer. Compression fractures in the thoracic spine, query malignant involvement. EXAM: MRI THORACIC SPINE WITHOUT AND WITH CONTRAST TECHNIQUE: Multiplanar and multiecho pulse sequences of the thoracic spine were obtained without and with intravenous contrast. CONTRAST:  28m MULTIHANCE GADOBENATE DIMEGLUMINE 529 MG/ML IV SOLN COMPARISON:  08/30/2016 FINDINGS: MRI THORACIC SPINE FINDINGS Alignment: No thoracic spine malalignment. There is minimal grade 1  degenerative retrolisthesis incidentally seen at the L2- 3 level. Vertebrae: There is a 60% compression fracture of T10 mainly involving the inferior endplate with 4 mm of posterior bony retropulsion along the inferior endplate. Generally there is reduced T1 and some faintly increased T2 signal throughout the vertebral body along with paraspinal edema, but I do not demonstrate specific or highly characteristic findings of metastatic disease at the T10 level. However, there is an abnormal T2 hyperintense and hand enhancing lesion in the posterior aspect of the right T4 vertebral body measuring 1.4 by 1.8 by 2.2 cm, also with extension through the right pedicle and into the right transverse process and concern for a fracture the right pedicle and transverse process base, or bony destructive findings. This appearance is more concerning for tumor, and there is potentially involvement of the spinous process and left lamina as well. There is minimal chronic superior endplate concavity at T4 and T5. Thoracic kyphosis is present. Cord:  No significant abnormal spinal cord signal is observed. Paraspinal and other soft tissues: I do not see any definite paraspinal tumor. There is paraspinal edema by the T10 compression fracture. Tumor is observed in the left lung and possibly in the right lower lobe as well, better characterized on dedicated chest imaging. Disc levels: No findings of impingement or significant degenerative disc disease above the T9-10 level. T9-10: Borderline right foraminal stenosis and borderline central narrowing of the thecal sac due to facet arthropathy. T10-11: Mild central narrowing of the thecal sac due to posterior retropulsion from the inferior endplate compression fracture at T10. T11-12: Unremarkable. T12-L1: Unremarkable. IMPRESSION: 1. Abnormal lesion compatible with tumor posteriorly in the T4 vertebral body and extending around in the posterior elements and transverse processes. No epidural  tumor identified. No cord lesion seen. 2. 60% compression fracture of T10 is probably benign but is associated with 4 mm of posterior bony retropulsion which causes mild central narrowing of the thecal sac at the T10-11 level. 3. Borderline right foraminal stenosis and borderline central narrowing of the thecal sac at T9-10 due to facet arthropathy. 4. Thoracic kyphosis. 5. Abnormal signal in both lungs favoring tumor. Electronically Signed   By: WVan ClinesM.D.   On: 09/08/2016 17:10   Nm Bone Scan Whole Body  Result Date: 09/12/2016 CLINICAL DATA:  History of lung carcinoma, with mid back pain and  right hip pain evaluate for metastasis EXAM: NUCLEAR MEDICINE WHOLE BODY BONE SCAN TECHNIQUE: Whole body anterior and posterior images were obtained approximately 3 hours after intravenous injection of radiopharmaceutical. RADIOPHARMACEUTICALS:  19.9 mCi Technetium-23mMDP IV COMPARISON:  PET-CT of 09/19/2015 and CT chest of 12/28/2012 FINDINGS: There is activity in the lower thoracic spine in the region of T10 or T11 worrisome for metastatic deposit. Somewhat mottled activity throughout the ribs also could indicate metastases. Also, there is activity within the proximal right femur -hip pre some this could be degenerative, but metastatic involvement is difficult to exclude. Probable degenerative change in both shoulders is noted symmetrically. IMPRESSION: 1. Increased activity in the lower thoracic spine in the region of T10 worrisome for metastatic lesion or compression deformity. Correlate with plain films. 2. Increased activity in the right hip. Correlate with plain films as well. 3. Somewhat mottled activity throughout multiple ribs. Metastatic lesions cannot be excluded. Electronically Signed   By: PIvar DrapeM.D.   On: 09/12/2016 13:54   Dg C-arm 1-60 Min  Result Date: 09/21/2016 CLINICAL DATA:  Intramedullary nail placement. EXAM: DG C-ARM 61-120 MIN COMPARISON:  None. FINDINGS: Fluoroscopic  images demonstrate placement of intramedullary right femoral nail, affixing intertrochanteric right proximal femoral fracture. The alignment is near anatomic. No evidence of immediate complications. Fluoroscopy time is recorded as 1 minutes 48 seconds. IMPRESSION: Intramedullary nail fixation of intertrochanteric right proximal femoral fracture, without evidence of immediate complications. Electronically Signed   By: DFidela SalisburyM.D.   On: 09/21/2016 13:38   Dg Hip Operative Unilat W Or W/o Pelvis Right  Result Date: 09/21/2016 CLINICAL DATA:  Fixation right hip fracture. EXAM: OPERATIVE right HIP (WITH PELVIS IF PERFORMED) 4 VIEWS TECHNIQUE: Fluoroscopic spot image(s) were submitted for interpretation post-operatively. COMPARISON:  09/20/2016 FINDINGS: Examination demonstrates placement of a right femoral intramedullary nail with associated compression screw bridging patient's trochanteric fracture into the femoral head. Hardware is intact as there is near anatomic alignment over the fracture site. IMPRESSION: Post fixation of right intertrochanteric fracture with hardware intact and near anatomic alignment over the fracture site. Electronically Signed   By: DMarin OlpM.D.   On: 09/21/2016 13:38   Dg Hip Unilat  With Pelvis 2-3 Views Right  Result Date: 09/20/2016 CLINICAL DATA:  Fall at home in shower.  Initial encounter. EXAM: DG HIP (WITH OR WITHOUT PELVIS) 2-3V RIGHT COMPARISON:  None. FINDINGS: Acute intertrochanteric right femur fracture with varus angulation from prominent displacement. The hips are located. No evidence of pelvic ring fracture or diastasis. IMPRESSION: Displaced intertrochanteric right femur fracture. Electronically Signed   By: JMonte FantasiaM.D.   On: 09/20/2016 08:53       Discharge Exam: Vitals:   09/25/16 2127 09/26/16 0530  BP: 139/66 131/73  Pulse: (!) 107 (!) 124  Resp: 16 16  Temp: 98.4 F (36.9 C) 98.3 F (36.8 C)   Vitals:   09/25/16 0445  09/25/16 1413 09/25/16 2127 09/26/16 0530  BP: 136/71 115/62 139/66 131/73  Pulse: (!) 105 (!) 104 (!) 107 (!) 124  Resp: '16 16 16 16  '$ Temp: 98.7 F (37.1 C) 98.3 F (36.8 C) 98.4 F (36.9 C) 98.3 F (36.8 C)  TempSrc: Oral Oral Oral Oral  SpO2: 98% 97% 99% 97%  Weight:      Height:        General: Pt is alert, awake, not in acute distress Cardiovascular: RRR, S1/S2 +, no rubs, no gallops Respiratory: CTA bilaterally, no wheezing, no rhonchi Abdominal:  Soft, NT, ND, bowel sounds + Extremities: no edema, no cyanosis    The results of significant diagnostics from this hospitalization (including imaging, microbiology, ancillary and laboratory) are listed below for reference.     Microbiology: Recent Results (from the past 240 hour(s))  Surgical PCR screen     Status: None   Collection Time: 09/21/16  1:12 AM  Result Value Ref Range Status   MRSA, PCR NEGATIVE NEGATIVE Final   Staphylococcus aureus NEGATIVE NEGATIVE Final    Comment:        The Xpert SA Assay (FDA approved for NASAL specimens in patients over 61 years of age), is one component of a comprehensive surveillance program.  Test performance has been validated by [facility] for patients greater than or equal to 49 year old. It is not intended to diagnose infection nor to guide or monitor treatment.      Labs: BNP (last 3 results) No results for input(s): BNP in the last 8760 hours. Basic Metabolic Panel:  Recent Labs Lab 09/21/16 0558 09/22/16 0632 09/23/16 0502 09/24/16 0349 09/26/16 0439  NA 138 135 134* 136 136  K 4.3 4.0 3.7 3.8 3.9  CL 105 102 98* 101 100*  CO2 '27 26 31 27 30  '$ GLUCOSE 137* 110* 101* 97 85  BUN '6 7 6 6 9  '$ CREATININE 0.74 0.61 0.51 0.47 0.53  CALCIUM 8.7* 8.5* 8.6* 8.6* 8.8*   Liver Function Tests: No results for input(s): AST, ALT, ALKPHOS, BILITOT, PROT, ALBUMIN in the last 168 hours. No results for input(s): LIPASE, AMYLASE in the last 168 hours. No results for  input(s): AMMONIA in the last 168 hours. CBC:  Recent Labs Lab 09/20/16 0822  09/22/16 0632 09/23/16 0502 09/24/16 0349 09/24/16 1118 09/26/16 0439  WBC 9.1  < > 8.9 9.0 5.3 6.0 4.0  NEUTROABS 4.6  --   --   --   --   --   --   HGB 9.5*  < > 6.1* 9.3* 8.3* 8.7* 8.5*  HCT 30.8*  < > 20.7* 29.0* 26.5* 28.1* 28.1*  MCV 86.5  < > 89.2 85.0 88.0 88.6 90.4  PLT 76*  < > 52* 41* 41* 36* 55*  < > = values in this interval not displayed. Cardiac Enzymes: No results for input(s): CKTOTAL, CKMB, CKMBINDEX, TROPONINI in the last 168 hours. BNP: Invalid input(s): POCBNP CBG: No results for input(s): GLUCAP in the last 168 hours. D-Dimer No results for input(s): DDIMER in the last 72 hours. Hgb A1c No results for input(s): HGBA1C in the last 72 hours. Lipid Profile No results for input(s): CHOL, HDL, LDLCALC, TRIG, CHOLHDL, LDLDIRECT in the last 72 hours. Thyroid function studies No results for input(s): TSH, T4TOTAL, T3FREE, THYROIDAB in the last 72 hours.  Invalid input(s): FREET3 Anemia work up No results for input(s): VITAMINB12, FOLATE, FERRITIN, TIBC, IRON, RETICCTPCT in the last 72 hours. Urinalysis    Component Value Date/Time   COLORURINE YELLOW 02/04/2013 [place] 02/04/2013 1033   LABSPEC 1.007 02/04/2013 1033   PHURINE 7.0 02/04/2013 1033   GLUCOSEU NEGATIVE 02/04/2013 1033   HGBUR NEGATIVE 02/04/2013 [place] 02/04/2013 1033   KETONESUR NEGATIVE 02/04/2013 1033   PROTEINUR NEGATIVE 02/04/2013 1033   UROBILINOGEN 0.2 02/04/2013 1033   NITRITE NEGATIVE 02/04/2013 1033   LEUKOCYTESUR TRACE (A) 02/04/2013 1033   Sepsis Labs Invalid input(s): PROCALCITONIN,  WBC,  LACTICIDVEN Microbiology Recent Results (from the past 240 hour(s))  Surgical PCR screen  Status: None   Collection Time: 09/21/16  1:12 AM  Result Value Ref Range Status   MRSA, PCR NEGATIVE NEGATIVE Final   Staphylococcus aureus NEGATIVE NEGATIVE Final     Comment:        The Xpert SA Assay (FDA approved for NASAL specimens in patients over 19 years of age), is one component of a comprehensive surveillance program.  Test performance has been validated by [facility] for patients greater than or equal to 53 year old. It is not intended to diagnose infection nor to guide or monitor treatment.      Time coordinating discharge: 35 minutes  SIGNED:   Domenic Polite, MD  [facility] 09/26/2016, 11:23 AM Pager   If 7PM-7AM, please contact night-coverage [email or web address] Password [email or web address]

## 2016-09-26 NOTE — Clinical Social Work Note (Signed)
Per Ebony Hail with [facility] she has gotten insurance authorization for pt to have a bed and admit today. CSW spoke with pt--pis agreeable and thankful. Pt reports she will call a family member to come get her things.   Clinical Social Worker facilitated patient discharge including contacting patient family and facility to confirm patient discharge plans.  Clinical information faxed to facility and family agreeable with plan.  CSW arranged ambulance transport via [facility] to [facility] .  RN to call [phone] for  report prior to discharge.  Clinical Social Worker will sign off for now as social work intervention is no longer needed. Please consult [place] again if new need arises.  [address], [place]

## 2016-09-27 ENCOUNTER — Ambulatory Visit: Payer: PRIVATE HEALTH INSURANCE

## 2016-09-27 MED FILL — TAGRISSO 80 MG TABLET: 80 | 30 days supply | Qty: 30 | Fill #1

## 2016-09-30 ENCOUNTER — Encounter (HOSPITAL_COMMUNITY): Payer: Self-pay | Admitting: *Deleted

## 2016-09-30 ENCOUNTER — Emergency Department (HOSPITAL_COMMUNITY)
Admission: EM | Admit: 2016-09-30 | Discharge: 2016-09-30 | Disposition: A | Discharge status: Home / Self Care | Payer: PRIVATE HEALTH INSURANCE | Attending: Emergency Medicine | Admitting: Emergency Medicine

## 2016-09-30 DIAGNOSIS — M79605 Pain in left leg: Secondary | ICD-10-CM | POA: Diagnosis not present

## 2016-09-30 DIAGNOSIS — M79604 Pain in right leg: Secondary | ICD-10-CM | POA: Insufficient documentation

## 2016-09-30 DIAGNOSIS — Z79899 Other long term (current) drug therapy: Secondary | ICD-10-CM | POA: Insufficient documentation

## 2016-09-30 DIAGNOSIS — G893 Neoplasm related pain (acute) (chronic): Secondary | ICD-10-CM | POA: Diagnosis present

## 2016-09-30 DIAGNOSIS — M549 Dorsalgia, unspecified: Secondary | ICD-10-CM | POA: Insufficient documentation

## 2016-09-30 DIAGNOSIS — I10 Essential (primary) hypertension: Secondary | ICD-10-CM | POA: Insufficient documentation

## 2016-09-30 DIAGNOSIS — Z85118 Personal history of other malignant neoplasm of bronchus and lung: Secondary | ICD-10-CM | POA: Insufficient documentation

## 2016-09-30 DIAGNOSIS — Z85830 Personal history of malignant neoplasm of bone: Secondary | ICD-10-CM | POA: Insufficient documentation

## 2016-09-30 DIAGNOSIS — Z7902 Long term (current) use of antithrombotics/antiplatelets: Secondary | ICD-10-CM | POA: Diagnosis not present

## 2016-09-30 DIAGNOSIS — Z87891 Personal history of nicotine dependence: Secondary | ICD-10-CM | POA: Diagnosis not present

## 2016-09-30 MED ORDER — FENTANYL CITRATE (PF) 100 MCG/2ML IJ SOLN
100.0000 ug | Freq: Once | INTRAMUSCULAR | Status: AC
  Administered 2016-09-30: 100 ug via INTRAVENOUS
  Filled 2016-09-30: qty 2

## 2016-09-30 MED ORDER — HYDROCODONE-ACETAMINOPHEN 5-325 MG PO TABS
2.0000 | ORAL_TABLET | Freq: Once | ORAL | Status: AC
  Administered 2016-09-30: 2 via ORAL
  Filled 2016-09-30: qty 2

## 2016-09-30 MED ORDER — OXYCODONE HCL ER 10 MG PO T12A
40.0000 mg | EXTENDED_RELEASE_TABLET | Freq: Two times a day (BID) | ORAL | Status: DC
  Administered 2016-09-30: 40 mg via ORAL
  Filled 2016-09-30: qty 4

## 2016-09-30 NOTE — ED Triage Notes (Signed)
Per EMS: pt is currently at [facility] rehab facility after fall and R femur fx on Christmas. Per EMS pt is very unhappy there, sts she doesn't feel safe there, her pain is poorly controlled. Pt has order stating "Send resident to [facility] on 09/30/2016 secondary to pain and extreme fall risk while placement arranged for different facility". Per EMS pt has lung cancer with bone mets. PIV established and pt was given 100 mcg fentanyl en route

## 2016-09-30 NOTE — Progress Notes (Signed)
LCSWA met with patient and family at bedside. Pt. And family expressed concerns about returning to current SNF and is requesting to change facilities. Patient reports she does not feels safe at facility and feels they are neglecting her care. She reports" they leave me on the toilet for long periods of time. I have complained on staff and have several grievances." LCSWA explain to patient [employer] insurance requires prior authorization and may take a few days, we cannot hold patient in ED for placement if she is medically stable. [place] educated patient and family about working with facility social worker to transfer to another SNF, patient and family report they do not Chief Financial Officer. LCSWA discussed patient returning home. Patient and family are agreeable, pt. reports she has medical equipment at home but will need a hospital bed.  LCSWA met with physician and discussed pt. and family plans.  Physician and this Probation officer met with family and presented options, to return to facility or return home. Family is arranging for patient to stay with a friend with one level home for patient safety.   Due to holiday unsure if patient will be able to get hospital bed for home. CSW informed RNCM on call about request for hospital bed.   Please reconsult CSW if needed. [place] signing off at this time.   Kathrin Greathouse, Latanya Presser, MSW Clinical Social Worker 5E and Psychiatric Service Line [phone] 09/30/2016  11:23 AM

## 2016-09-30 NOTE — ED Notes (Signed)
Attempted to call report to [employer], placed on hold until call disconnected. Will keep trying.
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
## 2016-09-30 NOTE — Progress Notes (Signed)
11:45 Patient report they are agreeable to return to SNF. LCSWA confirmed with Demetrius Revel patient still has bed. Informed RN about patient decision to return to facility.  Kathrin Greathouse, Latanya Presser, MSW Clinical Social Worker 5E and Psychiatric Service Line [phone] 09/30/2016  11:58 AM

## 2016-09-30 NOTE — ED Notes (Signed)
Bed: YT03 Expected date: 09/30/16 Expected time: 8:44 AM Means of arrival: Ambulance Comments: Fall Ca pt

## 2016-09-30 NOTE — ED Notes (Signed)
Social Work at bedside 

## 2016-10-01 ENCOUNTER — Non-Acute Institutional Stay (SKILLED_NURSING_FACILITY): Payer: PRIVATE HEALTH INSURANCE | Admitting: Adult Health

## 2016-10-01 ENCOUNTER — Telehealth: Payer: Self-pay | Admitting: Oncology

## 2016-10-01 ENCOUNTER — Encounter: Payer: Self-pay | Admitting: Adult Health

## 2016-10-01 ENCOUNTER — Ambulatory Visit: Payer: PRIVATE HEALTH INSURANCE | Admitting: Radiation Oncology

## 2016-10-01 ENCOUNTER — Ambulatory Visit: Payer: PRIVATE HEALTH INSURANCE

## 2016-10-01 DIAGNOSIS — S72141S Displaced intertrochanteric fracture of right femur, sequela: Secondary | ICD-10-CM

## 2016-10-01 DIAGNOSIS — T451X5A Adverse effect of antineoplastic and immunosuppressive drugs, initial encounter: Secondary | ICD-10-CM | POA: Diagnosis not present

## 2016-10-01 DIAGNOSIS — C342 Malignant neoplasm of middle lobe, bronchus or lung: Secondary | ICD-10-CM | POA: Diagnosis not present

## 2016-10-01 DIAGNOSIS — D6481 Anemia due to antineoplastic chemotherapy: Secondary | ICD-10-CM

## 2016-10-01 DIAGNOSIS — C7931 Secondary malignant neoplasm of brain: Secondary | ICD-10-CM

## 2016-10-01 NOTE — Telephone Encounter (Signed)
Called Wilsie to see how she is doing.  She said she is still having a lot of pain and is not happy with the current Rehab facility she is staying at as they are not giving her pain medication on time.  Advised her that [name] would like to schedule her [facility] of her right hip about 2 weeks after her surgery which would be next week.  [place] was agreeable with this.  She also asked about when the appointment with the specialist will be scheduled for the injection in her back.  Advised her that this will be researched and we will call her back with the status of this appointment as well as her new [facility] appointment.
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
## 2016-10-02 ENCOUNTER — Other Ambulatory Visit: Payer: Self-pay | Admitting: Internal Medicine

## 2016-10-02 ENCOUNTER — Ambulatory Visit: Payer: PRIVATE HEALTH INSURANCE

## 2016-10-02 ENCOUNTER — Other Ambulatory Visit (HOSPITAL_COMMUNITY): Payer: Self-pay | Admitting: Interventional Radiology

## 2016-10-02 DIAGNOSIS — C7951 Secondary malignant neoplasm of bone: Secondary | ICD-10-CM

## 2016-10-02 DIAGNOSIS — S22000A Wedge compression fracture of unspecified thoracic vertebra, initial encounter for closed fracture: Secondary | ICD-10-CM

## 2016-10-02 DIAGNOSIS — C342 Malignant neoplasm of middle lobe, bronchus or lung: Secondary | ICD-10-CM

## 2016-10-03 ENCOUNTER — Other Ambulatory Visit: Payer: Self-pay | Admitting: General Surgery

## 2016-10-03 ENCOUNTER — Other Ambulatory Visit: Payer: Self-pay | Admitting: Radiology

## 2016-10-03 ENCOUNTER — Ambulatory Visit: Payer: PRIVATE HEALTH INSURANCE

## 2016-10-04 ENCOUNTER — Encounter (HOSPITAL_COMMUNITY): Payer: Self-pay

## 2016-10-04 ENCOUNTER — Ambulatory Visit: Payer: PRIVATE HEALTH INSURANCE

## 2016-10-04 ENCOUNTER — Ambulatory Visit (HOSPITAL_COMMUNITY)
Admission: RE | Admit: 2016-10-04 | Discharge: 2016-10-04 | Disposition: A | Discharge status: Home / Self Care | Payer: PRIVATE HEALTH INSURANCE | Source: Ambulatory Visit | Attending: Interventional Radiology | Admitting: Interventional Radiology

## 2016-10-04 ENCOUNTER — Other Ambulatory Visit (HOSPITAL_COMMUNITY): Payer: Self-pay | Admitting: Interventional Radiology

## 2016-10-04 ENCOUNTER — Telehealth: Payer: Self-pay | Admitting: *Deleted

## 2016-10-04 DIAGNOSIS — Z801 Family history of malignant neoplasm of trachea, bronchus and lung: Secondary | ICD-10-CM | POA: Diagnosis not present

## 2016-10-04 DIAGNOSIS — Z7901 Long term (current) use of anticoagulants: Secondary | ICD-10-CM | POA: Diagnosis not present

## 2016-10-04 DIAGNOSIS — C7951 Secondary malignant neoplasm of bone: Secondary | ICD-10-CM | POA: Diagnosis not present

## 2016-10-04 DIAGNOSIS — W010XXA Fall on same level from slipping, tripping and stumbling without subsequent striking against object, initial encounter: Secondary | ICD-10-CM | POA: Insufficient documentation

## 2016-10-04 DIAGNOSIS — K589 Irritable bowel syndrome without diarrhea: Secondary | ICD-10-CM | POA: Diagnosis not present

## 2016-10-04 DIAGNOSIS — F329 Major depressive disorder, single episode, unspecified: Secondary | ICD-10-CM | POA: Diagnosis not present

## 2016-10-04 DIAGNOSIS — F419 Anxiety disorder, unspecified: Secondary | ICD-10-CM | POA: Insufficient documentation

## 2016-10-04 DIAGNOSIS — Z803 Family history of malignant neoplasm of breast: Secondary | ICD-10-CM | POA: Diagnosis not present

## 2016-10-04 DIAGNOSIS — S22000A Wedge compression fracture of unspecified thoracic vertebra, initial encounter for closed fracture: Secondary | ICD-10-CM

## 2016-10-04 DIAGNOSIS — D6481 Anemia due to antineoplastic chemotherapy: Secondary | ICD-10-CM | POA: Insufficient documentation

## 2016-10-04 DIAGNOSIS — C7931 Secondary malignant neoplasm of brain: Secondary | ICD-10-CM | POA: Diagnosis not present

## 2016-10-04 DIAGNOSIS — M1990 Unspecified osteoarthritis, unspecified site: Secondary | ICD-10-CM | POA: Insufficient documentation

## 2016-10-04 DIAGNOSIS — Z9221 Personal history of antineoplastic chemotherapy: Secondary | ICD-10-CM | POA: Insufficient documentation

## 2016-10-04 DIAGNOSIS — C342 Malignant neoplasm of middle lobe, bronchus or lung: Secondary | ICD-10-CM | POA: Insufficient documentation

## 2016-10-04 DIAGNOSIS — Z923 Personal history of irradiation: Secondary | ICD-10-CM | POA: Diagnosis not present

## 2016-10-04 DIAGNOSIS — M353 Polymyalgia rheumatica: Secondary | ICD-10-CM | POA: Insufficient documentation

## 2016-10-04 DIAGNOSIS — Z87891 Personal history of nicotine dependence: Secondary | ICD-10-CM | POA: Diagnosis not present

## 2016-10-04 DIAGNOSIS — E785 Hyperlipidemia, unspecified: Secondary | ICD-10-CM | POA: Diagnosis not present

## 2016-10-04 DIAGNOSIS — C7800 Secondary malignant neoplasm of unspecified lung: Secondary | ICD-10-CM | POA: Insufficient documentation

## 2016-10-04 DIAGNOSIS — Z8249 Family history of ischemic heart disease and other diseases of the circulatory system: Secondary | ICD-10-CM | POA: Insufficient documentation

## 2016-10-04 DIAGNOSIS — I10 Essential (primary) hypertension: Secondary | ICD-10-CM | POA: Insufficient documentation

## 2016-10-04 DIAGNOSIS — S22079A Unspecified fracture of T9-T10 vertebra, initial encounter for closed fracture: Secondary | ICD-10-CM | POA: Insufficient documentation

## 2016-10-04 DIAGNOSIS — Z7952 Long term (current) use of systemic steroids: Secondary | ICD-10-CM | POA: Insufficient documentation

## 2016-10-04 HISTORY — PX: IR GENERIC HISTORICAL: IMG1180011

## 2016-10-04 LAB — BASIC METABOLIC PANEL
ANION GAP: 10 (ref 5–15)
BUN: 8 mg/dL (ref 6–20)
CO2: 26 mmol/L (ref 22–32)
Calcium: 9.1 mg/dL (ref 8.9–10.3)
Chloride: 101 mmol/L (ref 101–111)
Creatinine, Ser: 0.69 mg/dL (ref 0.44–1.00)
GFR calc Af Amer: >60 mL/min (ref >60)
GFR calc non Af Amer: >60 mL/min (ref >60)
GLUCOSE: 77 mg/dL (ref 65–99)
POTASSIUM: 3.6 mmol/L (ref 3.5–5.1)
Sodium: 137 mmol/L (ref 135–145)

## 2016-10-04 LAB — APTT: aPTT: 38 seconds — ABNORMAL HIGH (ref 24–36)

## 2016-10-04 LAB — CBC
HCT: 29.2 % — ABNORMAL LOW (ref 36.0–46.0)
HEMOGLOBIN: 8.8 g/dL — AB (ref 12.0–15.0)
MCH: 26.3 pg (ref 26.0–34.0)
MCHC: 30.1 g/dL (ref 30.0–36.0)
MCV: 87.4 fL (ref 78.0–100.0)
Platelets: 89 10*3/uL — ABNORMAL LOW (ref 150–400)
RBC: 3.34 MIL/uL — ABNORMAL LOW (ref 3.87–5.11)
RDW: 18.7 % — ABNORMAL HIGH (ref 11.5–15.5)
WBC: 10.2 10*3/uL (ref 4.0–10.5)

## 2016-10-04 LAB — PROTIME-INR
INR: 1.44
Prothrombin Time: 17.6 seconds — ABNORMAL HIGH (ref 11.4–15.2)

## 2016-10-04 MED ORDER — CEFAZOLIN SODIUM-DEXTROSE 2-4 GM/100ML-% IV SOLN
2.0000 g | INTRAVENOUS | Status: AC
  Administered 2016-10-04: 2 g via INTRAVENOUS

## 2016-10-04 MED ORDER — TOBRAMYCIN SULFATE 1.2 G IJ SOLR
INTRAMUSCULAR | Status: AC
  Filled 2016-10-04: qty 1.2

## 2016-10-04 MED ORDER — BUPIVACAINE HCL (PF) 0.25 % IJ SOLN
INTRAMUSCULAR | Status: AC
  Filled 2016-10-04: qty 30

## 2016-10-04 MED ORDER — CEFAZOLIN SODIUM-DEXTROSE 2-4 GM/100ML-% IV SOLN
INTRAVENOUS | Status: AC
  Filled 2016-10-04: qty 100

## 2016-10-04 MED ORDER — SODIUM CHLORIDE 0.9 % IV SOLN: INTRAVENOUS | Status: AC

## 2016-10-04 MED ORDER — MIDAZOLAM HCL 2 MG/2ML IJ SOLN
INTRAMUSCULAR | Status: AC | PRN
  Administered 2016-10-04 (×2): 1 mg via INTRAVENOUS

## 2016-10-04 MED ORDER — BUPIVACAINE HCL (PF) 0.25 % IJ SOLN
INTRAMUSCULAR | Status: AC | PRN
  Administered 2016-10-04: 20 mL

## 2016-10-04 MED ORDER — GELATIN ABSORBABLE 12-7 MM EX MISC
CUTANEOUS | Status: AC | PRN
  Administered 2016-10-04: 1 via TOPICAL

## 2016-10-04 MED ORDER — FENTANYL CITRATE (PF) 100 MCG/2ML IJ SOLN
INTRAMUSCULAR | Status: AC
  Filled 2016-10-04: qty 2

## 2016-10-04 MED ORDER — TOBRAMYCIN SULFATE 1.2 G IJ SOLR
INTRAMUSCULAR | Status: AC | PRN
  Administered 2016-10-04: .1 g via TOPICAL

## 2016-10-04 MED ORDER — FENTANYL CITRATE (PF) 100 MCG/2ML IJ SOLN
INTRAMUSCULAR | Status: AC | PRN
  Administered 2016-10-04 (×2): 25 ug via INTRAVENOUS

## 2016-10-04 MED ORDER — IOPAMIDOL (ISOVUE-300) INJECTION 61%
INTRAVENOUS | Status: AC
  Administered 2016-10-04: 3 mL
  Filled 2016-10-04: qty 50

## 2016-10-04 MED ORDER — GELATIN ABSORBABLE 12-7 MM EX MISC
CUTANEOUS | Status: AC
  Filled 2016-10-04: qty 1

## 2016-10-04 MED ORDER — MIDAZOLAM HCL 2 MG/2ML IJ SOLN
INTRAMUSCULAR | Status: AC
  Filled 2016-10-04: qty 2

## 2016-10-04 MED ORDER — SODIUM CHLORIDE 0.9 % IV SOLN: INTRAVENOUS | Status: DC

## 2016-10-04 NOTE — Sedation Documentation (Signed)
Patient is resting comfortably. 

## 2016-10-04 NOTE — Telephone Encounter (Signed)
"  This is [place] with [facility].  We just did a Vertebroplasty on this patient.  Need a doctor to order first pathology biopsy orders."

## 2016-10-04 NOTE — Sedation Documentation (Signed)
Patient denies pain and is resting comfortably.  

## 2016-10-04 NOTE — Progress Notes (Signed)
[facility] rehab center, where pt. Was being cared for, and spoke with [place], who verified that pt. Medication, eliquis, was last given Monday morning. I asked for absoulte clarity. I was assured that the pts. Dose of eliquis  Was last given Monday morning, "in preparation for her procedure". Information shared with Dr. Estanislado Pandy.

## 2016-10-04 NOTE — Discharge Instructions (Signed)
1.No stooping,bending or lifting more than 10 lbs for 2 weeks. 2.Use walker to ambulate . 3.RTC 2 weeks PRN KYPHOPLASTY/VERTEBROPLASTY DISCHARGE INSTRUCTIONS  Medications: (check all that apply)     Resume all home medications as before procedure.       Resume your  eliquis tonight                  Continue your pain medications as prescribed as needed.  Over the next 3-5 days, decrease your pain medication as tolerated.  Over the counter medications (i.e. Tylenol, ibuprofen, and aleve) may be substituted once severe/moderate pain symptoms have subsided.   Wound Care: - Bandages may be removed the day following your procedure.  You may get your incision wet once bandages are removed.  Bandaids may be used to cover the incisions until scab formation.  Topical ointments are optional.  - If you develop a fever greater than 101 degrees, have increased skin redness at the incision sites or pus-like oozing from incisions occurring within 1 week of the procedure, contact radiology at [phone] or [phone].  - Ice pack to back for 15-20 minutes 2-3 time per day for first 2-3 days post procedure.  The ice will expedite muscle healing and help with the pain from the incisions.   Activity: - Bedrest today with limited activity for 24 hours post procedure.  - No driving for 48 hours.  - Increase your activity as tolerated after bedrest (with assistance if necessary).  - Refrain from any strenuous activity or heavy lifting (greater than 10 lbs.).  -

## 2016-10-04 NOTE — Procedures (Signed)
S/P T 10 VP
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
## 2016-10-04 NOTE — Sedation Documentation (Signed)
Patient denies procedural pain and is resting comfortably.

## 2016-10-04 NOTE — Telephone Encounter (Signed)
Called Radiology [phone], spoke with Rip Harbour notifying her we do not order this just to send to Pathology.  Call transferred to ext [phone].

## 2016-10-06 ENCOUNTER — Encounter: Payer: Self-pay | Admitting: Radiation Oncology

## 2016-10-06 ENCOUNTER — Encounter: Payer: Self-pay | Admitting: Internal Medicine

## 2016-10-07 ENCOUNTER — Telehealth: Payer: Self-pay | Admitting: Medical Oncology

## 2016-10-07 ENCOUNTER — Telehealth: Payer: Self-pay | Admitting: Oncology

## 2016-10-07 ENCOUNTER — Encounter (HOSPITAL_COMMUNITY): Payer: Self-pay | Admitting: Interventional Radiology

## 2016-10-07 ENCOUNTER — Ambulatory Visit: Payer: PRIVATE HEALTH INSURANCE

## 2016-10-07 ENCOUNTER — Ambulatory Visit
Admission: RE | Admit: 2016-10-07 | Discharge: 2016-10-07 | Disposition: A | Discharge status: Home / Self Care | Payer: PRIVATE HEALTH INSURANCE | Source: Ambulatory Visit | Attending: Radiation Oncology | Admitting: Radiation Oncology

## 2016-10-07 ENCOUNTER — Non-Acute Institutional Stay (SKILLED_NURSING_FACILITY): Payer: PRIVATE HEALTH INSURANCE | Admitting: Internal Medicine

## 2016-10-07 ENCOUNTER — Encounter: Payer: Self-pay | Admitting: Radiation Oncology

## 2016-10-07 DIAGNOSIS — Z9189 Other specified personal risk factors, not elsewhere classified: Secondary | ICD-10-CM | POA: Insufficient documentation

## 2016-10-07 DIAGNOSIS — W19XXXD Unspecified fall, subsequent encounter: Secondary | ICD-10-CM | POA: Diagnosis not present

## 2016-10-07 DIAGNOSIS — S72141D Displaced intertrochanteric fracture of right femur, subsequent encounter for closed fracture with routine healing: Secondary | ICD-10-CM | POA: Diagnosis not present

## 2016-10-07 NOTE — Assessment & Plan Note (Signed)
No cardiac or neurologic prodrome prior to the fall. It was a mechanical fall sustained when she slipped on wet tile

## 2016-10-07 NOTE — Telephone Encounter (Signed)
Remo Lipps called and asked if Denay will have 1 radiation treatment or a series of treatments.  Aggie Moats that it will be a series of treatments and that [name] will verify the number on Monday at her appointment.  Also advised that we are not able to see the Burgess Estelle that Gaynell had today in [place].  Remo Lipps said she would ask Marithza about that this afternoon.

## 2016-10-07 NOTE — Assessment & Plan Note (Signed)
Risk discussed with patient

## 2016-10-07 NOTE — Telephone Encounter (Signed)
Pt not doing well. Had a rough time with PT . She does not have an appetite. She is unable to transfer so an appt was cancelled . It has been r/s for next week.

## 2016-10-07 NOTE — Assessment & Plan Note (Signed)
PT/OT at the SNF

## 2016-10-07 NOTE — Progress Notes (Signed)
This is a comprehensive admission note to [facility] performed on this date less than 30 days from date of admission. Included are preadmission medical/surgical history;reconciled medication list; family history; social history and comprehensive review of systems.  Corrections and additions to the records were documented . Comprehensive physical exam was also performed. Additionally a clinical summary was entered for each active diagnosis pertinent to this admission in the Problem List to enhance continuity of care.  LPF:XTKW Ross MD Full Code (code status  & opiod risk discussed in detail)  HPI: The patient has recurrent non-small cell lung cancer with bone and brain metastases for which she's been receiving chemotherapy and radiation therapy.  She had fallen at home slipping on wet tile getting out of the bathtub. Fracture of the right hip was suggested by CT and bone scan. Intramedullary nailing was performed by Dr. Novella Olive 12/23. Eliquis was to be continued for 4 weeks for DVT prevention. Perioperative blood loss and hemodilution resulted in a hemoglobin of 6.1 for which she was transfused 2 units of PRBC.Ferritin, B12, and folate were normal. She remains on Tagrisso from Dr. Earlie Server for the lung cancer. The bone scan also revealed a T10 vertebral compression fracture which was felt to be benign. Kyphoplasty was  Performed 1/5//18.The patient has no significant back pain. Patient had persistent tachycardia in the low 100s. Patient was asymptomatic. A T4 was mildly elevated at 1.41 TSH normal at 1.002. "Sick euthyroidism" was suggested.Follow-up thyroid function test was recommended in 2 weeks as an outpatient. The patient initially was placed and another nursing facility but was not pleased with the care prompting transfer here on 10/04/16. The patient was discharged on high-dose narcotics. OxyContin was prescribed as 40 mg every 12 hours. She was also prescribed  hydrocodone/acetaminophen 5-3 25 g 1-2 tablets every 3 hours as needed. Additionally she was prescribed acetaminophen by itself 325 mg every 6 hours as needed. Upon review of the discharge orders; I decreased the hydrocodone/acetaminophen to 7.5 mg every 4 hours as needed.  Past medical and surgical history: Includes polymyalgia rheumatica, irritable bowel syndrome, hypertension, dyslipidemia, and depression. Surgeries include craniotomy for tumor resection and cranial navigation application  Social history: Reviewed and verified ;she drinks minimally  Family history: Reviewed.  Review of systems: There was no cardiac or neurologic prodrome prior to the fall. She slipped on wet tile as there was no bath mat by the tub. She describes some crusting in her nares with minor clots. She does bruise easily. She has had loose stool but not frank diarrhea.  Last night she had pain and swelling in the right extremity above the knee after PT/OT. Portable films apparently were completed. These will be reviewed.Dry mouth is frequent. No bleeding dyscrasias reported on the Eliquis. Appetite is poor.  Constitutional: No fever,significant weight change, fatigue  Eyes: No redness, discharge, pain, vision change ENT/mouth: No nasal congestion,  purulent discharge, earache,change in hearing ,sore throat  Cardiovascular: No chest pain, palpitations,paroxysmal nocturnal dyspnea, claudication, edema  Respiratory: No cough, sputum production,hemoptysis, DOE , significant snoring,apnea   Gastrointestinal: No heartburn,dysphagia,abdominal pain, nausea / vomiting,rectal bleeding, melena Genitourinary: No dysuria,hematuria, pyuria,  incontinence, nocturia Dermatologic: No rash, pruritus, change in appearance of skin Neurologic: No dizziness,headache,syncope, seizures, numbness , tingling Psychiatric: No significant anxiety , depression, insomnia,  Endocrine: No change in hair/skin/ nails, excessive thirst, excessive  hunger, excessive urination  Hematologic/lymphatic: No  lymphadenopathy,abnormal bleeding Allergy/immunology: No itchy/ watery eyes, significant sneezing, urticaria, angioedema  Physical exam:  Pertinent or positive findings: the patient looks amazingly good in spite of her history. She is bright, alert, & interactive. She has an S4 type gallop. Biceps reflex are 1.5+. The right knee is 0-1/2+. Reflex at the left knee is 1+. She has scattered bruising over the extremities. Slight clubbing suggested. Pedal pulses are decreased. She exhibits an intermittent tremor of her lower extremities.   General appearance:Adequately nourished; no acute distress , increased work of breathing is present.   Lymphatic: No lymphadenopathy about the head, neck, axilla . Eyes: No conjunctival inflammation or lid edema is present. There is no scleral icterus. Ears:  External ear exam shows no significant lesions or deformities.   Nose:  External nasal examination shows no deformity or inflammation. Nasal mucosa are pink and moist without lesions ,exudates Oral exam: lips and gums are healthy appearing.There is no oropharyngeal erythema or exudate . Neck:  No thyromegaly, masses, tenderness noted.    Heart:  regular rhythm.  S2 normal w/o murmur, click, rub .  Lungs:Chest clear to auscultation without wheezes, rhonchi,rales , rubs. Abdomen:Bowel sounds are normal. Abdomen is soft and nontender with no organomegaly, hernias,masses. GU: deferred  Extremities:  No cyanosis, edema  Neuro:Balance,Rhomberg,finger to nose testing could not be completed due to clinical state Skin: Warm & dry w/o tenting. No significant  rash.  See clinical summary under each active problem in the Problem List with associated updated therapeutic plan

## 2016-10-07 NOTE — Patient Instructions (Signed)
See Current Assessment & Plan in Problem List under specific Diagnosis Total time 65  minutes; greater than 50% of the visit spent counseling patient and coordinating care for problems addressed at this encounter

## 2016-10-07 NOTE — Telephone Encounter (Signed)
Called Tracy Dodson and advised her that we will reschedule her appointments with [name] and will call her back with the times.  Also asked if it is Ok to talk to her sister in law, Tracy Dodson.  Tracy Dodson said it was fine to talk to Tracy Dodson and to add her to the [place] list.

## 2016-10-08 ENCOUNTER — Telehealth (HOSPITAL_COMMUNITY): Payer: Self-pay

## 2016-10-08 ENCOUNTER — Ambulatory Visit: Payer: PRIVATE HEALTH INSURANCE

## 2016-10-08 NOTE — Telephone Encounter (Signed)
Called to schedule f/u, left message. AW

## 2016-10-09 ENCOUNTER — Ambulatory Visit: Payer: PRIVATE HEALTH INSURANCE

## 2016-10-09 ENCOUNTER — Telehealth: Payer: Self-pay | Admitting: Medical Oncology

## 2016-10-09 NOTE — Telephone Encounter (Signed)
I gave update on pt condition on [facility] voice mail.

## 2016-10-10 ENCOUNTER — Ambulatory Visit: Payer: PRIVATE HEALTH INSURANCE

## 2016-10-11 ENCOUNTER — Ambulatory Visit: Payer: PRIVATE HEALTH INSURANCE

## 2016-10-14 ENCOUNTER — Ambulatory Visit: Payer: PRIVATE HEALTH INSURANCE

## 2016-10-14 ENCOUNTER — Ambulatory Visit
Admission: RE | Admit: 2016-10-14 | Discharge: 2016-10-14 | Disposition: A | Discharge status: Home / Self Care | Payer: PRIVATE HEALTH INSURANCE | Source: Ambulatory Visit | Attending: Radiation Oncology | Admitting: Radiation Oncology

## 2016-10-14 ENCOUNTER — Encounter: Payer: Self-pay | Admitting: Radiation Oncology

## 2016-10-14 DIAGNOSIS — C3490 Malignant neoplasm of unspecified part of unspecified bronchus or lung: Secondary | ICD-10-CM | POA: Diagnosis present

## 2016-10-14 DIAGNOSIS — M545 Low back pain: Secondary | ICD-10-CM | POA: Diagnosis not present

## 2016-10-14 DIAGNOSIS — C7931 Secondary malignant neoplasm of brain: Secondary | ICD-10-CM

## 2016-10-14 DIAGNOSIS — C7801 Secondary malignant neoplasm of right lung: Secondary | ICD-10-CM | POA: Diagnosis not present

## 2016-10-14 DIAGNOSIS — C7802 Secondary malignant neoplasm of left lung: Secondary | ICD-10-CM | POA: Diagnosis not present

## 2016-10-14 DIAGNOSIS — Z510 Encounter for antineoplastic radiation therapy: Secondary | ICD-10-CM | POA: Diagnosis present

## 2016-10-14 DIAGNOSIS — M4854XA Collapsed vertebra, not elsewhere classified, thoracic region, initial encounter for fracture: Secondary | ICD-10-CM | POA: Diagnosis not present

## 2016-10-14 DIAGNOSIS — C7951 Secondary malignant neoplasm of bone: Secondary | ICD-10-CM

## 2016-10-14 DIAGNOSIS — M25511 Pain in right shoulder: Secondary | ICD-10-CM | POA: Diagnosis not present

## 2016-10-14 NOTE — Progress Notes (Signed)
Please see the Nurse Progress Note in the MD Initial Consult Encounter for this patient. 

## 2016-10-14 NOTE — Progress Notes (Signed)
Paperwork (principal insurance) received 1/11, given to nurse 1/15

## 2016-10-14 NOTE — Progress Notes (Signed)
Tracy Dodson is here for follow up new.  She currently reports having pain in her right shoulder at a 6/10 and in her right hip at a 5/10.  She is taking oxycodone 20 mg twice a day and norco 7.5/325 mg for breakthrough pain.  She reports the stiches were taken out of her right thigh last Tuesday.  She has not been ambulating yet due to pain.  She can weight bear on the right side and is working with PT at [facility].    BP 117/70 (BP Location: Left Arm, Patient Position: Sitting)   Pulse (!) 113   Temp 98.5 F (36.9 C) (Oral)   Ht 5' 4.5" (1.638 m)   SpO2 95%    .kh

## 2016-10-14 NOTE — Progress Notes (Signed)
Radiation Oncology         [phone] ________________________________  Name: Tracy Dodson MRN: [id number]  Date: 10/14/2016  DOB: [birth year]  Re-Consultation Visit Note  CC: [name], MD  Curt Bears, MD    ICD-9-CM ICD-10-CM   1. Bone metastasis (HCC) 198.5 C79.51     Diagnosis: Recurrent non-small cell lung cancer initially diagnosed as stage IIIA (T1b N2M0) suspicious for adenocarcinoma with EGFR mutation in exon 19 and negative ALK gene translocation, now with oligo metastasis involving the left upper lobe and right lower lobe, recent bone scan and thoracic MRI showing osseous metastasis  Interval Since Last Radiation:  5 weeks  08/27/16-09/06/16: 1) SBRT left lung / 54 Gy in 3 fractions 2) SBRT right lung / 50 Gy in 5 fractions  04/11/2016:  PTV1 Rt occipital target 25 mm target was treated using 4 Arcs to a prescription dose of 14 Gy. ExacTrac Snap verification was performed for each couch angle.   10/20/2012 through 11/27/2012: Right chest region 50.4 Gy in 28 fractions  Narrative:  The patient returns today for a re-consultation. The patient had a bone scan on 09/12/16 for mid-back pain and right hip pain. It noted increased activity in the region of T10, the proximal right femur-hip, and throughout the ribs worrisome for metastatic disease. MRI of the spine on 09/08/16 and CT of the spine on 08/30/16 showed a fracture of T10. On 09/20/16, the patient presented to the ED when she fell in the bathroom. She had a displaced right hip intertrochanteric fracture that required surgery on 09/21/16. On 10/04/16, the patient had a vertebroplasty of T10. Bone biopsy revealed bone with hypercellular marrow with no etastatic carcinoma identified. Therefore radiation to T10 is not recommended. The patient and a friend present today to discuss palliative radiation to the right hip.  She currently reports having pain in her right shoulder as a 6/10 and in her right hip as a 5/10. She  is taking oxycodone 20 mg twice a day and norco 7.5/325 mg for breakthrough pain. She reports the stiches were taken out of her right thigh last Tuesday. She has not been ambulating yet due to the pain. She can weight bear on the right side and is working with PT at [facility]. She states that she tried picking up a puppy before her hospitalization and felt her right shoulder pop.  ALLERGIES:  is allergic to compazine [prochlorperazine edisylate] and contrast media [iodinated diagnostic agents].  Meds: Current Outpatient Prescriptions  Medication Sig Dispense Refill  . apixaban (ELIQUIS) 2.5 MG TABS tablet Take 1 tablet (2.5 mg total) by mouth 2 (two) times daily. 60 tablet 0  . cholecalciferol (VITAMIN D) 1000 UNITS tablet Take 2,000 Units by mouth daily.     . citalopram (CELEXA) 20 MG tablet Take '20mg'$  by mouth once daily  4  . doxylamine, Sleep, (UNISOM) 25 MG tablet Take 1 tablet (25 mg total) by mouth at bedtime. 30 tablet 0  . HYDROcodone-acetaminophen (NORCO) 7.5-325 MG tablet Take 1 tablet by mouth every 4 (four) hours as needed for moderate pain.    . Multiple Vitamins-Minerals (CENTRUM SILVER PO) Take 1 capsule by mouth daily.    [name] oxyCODONE (OXYCONTIN) 20 mg 12 hr tablet Take 2 tablets (40 mg total) by mouth every 12 (twelve) hours. 20 tablet 0  . pantoprazole (PROTONIX) 40 MG tablet Take 1 tablet (40 mg total) by mouth daily. Switch for any other PPI at similar dose and frequency (Patient taking differently: Take  40 mg by mouth daily. ) 30 tablet 0  . polyethylene glycol (MIRALAX / GLYCOLAX) packet Take 17 g by mouth daily as needed for mild constipation.  0  . predniSONE (DELTASONE) 1 MG tablet Take 2-3 mg by mouth 2 (two) times daily with a meal. Take '3mg'$  in the am, and '2mg'$  at night for a total of '5mg'$  qd..    . PREVIDENT 5000 DRY MOUTH 1.1 % GEL dental gel Place 1 application onto teeth daily as needed (sensitive toothpaste).   0  . Probiotic Product (PROBIOTIC DAILY PO) Take 250 mg by  mouth.    . TAGRISSO 80 MG tablet TAKE 1 TABLET BY MOUTH ONCE DAILY (Patient taking differently: TAKE '80mg'$  TABLET BY MOUTH ONCE DAILY) 30 tablet 2  . HYDROcodone-acetaminophen (NORCO/VICODIN) 5-325 MG tablet Take 1-2 tablets by mouth every 3 (three) hours as needed for moderate pain (may take 2 tablets every 3-4 hours prn for back pain). (Patient not taking: Reported on 10/14/2016) 40 tablet 0   No current facility-administered medications for this encounter.     Physical Findings: The patient is in no acute distress. Patient is alert and oriented.  height is 5' 4.5" (1.638 m). Her oral temperature is 98.5 F (36.9 C). Her blood pressure is 118/67 and her pulse is 111 (abnormal). Her oxygen saturation is 94%.   Lying in a hospital bed. Mild tenderness along the muscles of the right upper back, no obvious rib tenderness.  Lab Findings: Lab Results  Component Value Date   WBC 10.2 10/04/2016   HGB 8.8 (L) 10/04/2016   HCT 29.2 (L) 10/04/2016   MCV 87.4 10/04/2016   PLT 89 (L) 10/04/2016    Radiographic Findings: Dg Chest 1 View  Result Date: 09/20/2016 CLINICAL DATA:  66 year old female with history of trauma from a fall at home. History of lung cancer. EXAM: CHEST 1 VIEW COMPARISON:  Chest x-ray 08/30/2016. FINDINGS: Known left upper lobe and right lower lobe nodules are not well demonstrated on today's chest radiograph, which could indicate positive response to therapy, or could partially be related to decreased sensitivity of chest radiographs. No acute consolidative airspace disease. No pleural effusions. Chronic scarring in the right lung base is unchanged. No pneumothorax. No evidence of pulmonary edema. Heart size is normal. The patient is rotated to the left on today's exam, resulting in distortion of the mediastinal contours and reduced diagnostic sensitivity and specificity for mediastinal pathology. Visualized bony thorax appears grossly intact. Right internal jugular single-lumen  power porta cath with tip terminating in the distal superior vena cava. IMPRESSION: 1. No evidence of significant acute traumatic injury to the thorax. 2. Known left upper lobe and right lower lobe pulmonary nodules are poorly demonstrated on today's chest radiograph. Electronically Signed   By: Vinnie Langton M.D.   On: 09/20/2016 08:57   Dg C-arm 1-60 Min  Result Date: 09/21/2016 CLINICAL DATA:  Intramedullary nail placement. EXAM: DG C-ARM 61-120 MIN COMPARISON:  None. FINDINGS: Fluoroscopic images demonstrate placement of intramedullary right femoral nail, affixing intertrochanteric right proximal femoral fracture. The alignment is near anatomic. No evidence of immediate complications. Fluoroscopy time is recorded as 1 minutes 48 seconds. IMPRESSION: Intramedullary nail fixation of intertrochanteric right proximal femoral fracture, without evidence of immediate complications. Electronically Signed   By: Fidela Salisbury M.D.   On: 09/21/2016 13:38   Ir Vertebroplasty Cerv/thor Bx Inc Uni/bil Inc/inject/imaging  Result Date: 10/07/2016 INDICATION: Severe low back pain secondary to compression fracture at T10. Patient with metastatic  lung carcinoma. EXAM: IR VERTEBROPLASTY CERVICOTHORACIC INJ<Procedure Description>IR VERTEBROPLASTY CERVICOTHORACIC INJ MEDICATIONS: As antibiotic prophylaxis, Ancef 2 g IV was ordered pre-procedure and administered intravenously within 1 hour of incision. ANESTHESIA/SEDATION: Moderate (conscious) sedation was employed during this procedure. A total of Versed 2 mg and Fentanyl 50 mcg was administered intravenously. Moderate Sedation Time: 25 minutes. The patient's level of consciousness and vital signs were monitored continuously by radiology nursing throughout the procedure under my direct supervision. FLUOROSCOPY TIME:  Fluoroscopy Time: 6 minutes 6 seconds (875 mGy) COMPLICATIONS: None immediate. TECHNIQUE: Informed written consent was obtained from the patient after a  thorough discussion of the procedural risks, benefits and alternatives. All questions were addressed. Maximal Sterile Barrier Technique was utilized including caps, mask, sterile gowns, sterile gloves, sterile drape, hand hygiene and skin antiseptic. A timeout was performed prior to the initiation of the procedure. PROCEDURE: The patient was placed prone on the fluoroscopic table. Nasal oxygen was administered. Physiologic monitoring was performed throughout the duration of the procedure. The skin overlying the thoracic region was prepped and draped in the usual sterile fashion. The T10 vertebral body was identified and the right pedicle was infiltrated with 0.25% Bupivacaine. This was then followed by the advancement of a 13-gauge Cook needle through the right pedicle into the posterior one-third at T10. Two passes were then made with separate 16 gauge core biopsy needles. Tissues were obtained using 20 mL syringe, and sent for pathologic analysis. The working needle was then advanced into the distal one-third. A gentle contrast injection demonstrated a trabecular pattern of contrast and opacification of paraspinous veins. This necessitated the use of Gel-Foam pledgets into the 13 gauge Cook spinal needle prior to the delivery of the methylmethacrylate mixture. At this time, methylmethacrylate mixture was reconstituted. Under biplane intermittent fluoroscopy, the methylmethacrylate was then injected into the T10 vertebral body with filling of the vertebral body. No extravasation was noted into the disk spaces or posteriorly into the spinal canal. No epidural venous contamination was seen. The needle was then removed. Hemostasis was achieved at the skin entry site. There were no acute complications. Patient tolerated the procedure well. The patient was observed for 3 hours and discharged in good condition. IMPRESSION: 1. Status post vertebral body augmentation for painful compression fracture at T10 using  vertebroplasty technique. Core sample tissues were sent for pathologic analysis. The pathology report to be sent to the patient's oncologist. This was explained to the patient and the patient's spouse. Electronically Signed   By: Luanne Bras M.D.   On: 10/04/2016 10:42   Dg Hip Operative Unilat W Or W/o Pelvis Right  Result Date: 09/21/2016 CLINICAL DATA:  Fixation right hip fracture. EXAM: OPERATIVE right HIP (WITH PELVIS IF PERFORMED) 4 VIEWS TECHNIQUE: Fluoroscopic spot image(s) were submitted for interpretation post-operatively. COMPARISON:  09/20/2016 FINDINGS: Examination demonstrates placement of a right femoral intramedullary nail with associated compression screw bridging patient's trochanteric fracture into the femoral head. Hardware is intact as there is near anatomic alignment over the fracture site. IMPRESSION: Post fixation of right intertrochanteric fracture with hardware intact and near anatomic alignment over the fracture site. Electronically Signed   By: Marin Olp M.D.   On: 09/21/2016 13:38   Dg Hip Unilat  With Pelvis 2-3 Views Right  Result Date: 09/20/2016 CLINICAL DATA:  Fall at home in shower.  Initial encounter. EXAM: DG HIP (WITH OR WITHOUT PELVIS) 2-3V RIGHT COMPARISON:  None. FINDINGS: Acute intertrochanteric right femur fracture with varus angulation from prominent displacement. The hips are  located. No evidence of pelvic ring fracture or diastasis. IMPRESSION: Displaced intertrochanteric right femur fracture. Electronically Signed   By: Monte Fantasia M.D.   On: 09/20/2016 08:53    Impression:  Recurrent non-small cell lung cancer initially diagnosed as stage IIIA (T1b N2M0) suspicious for adenocarcinoma with EGFR mutation in exon 19 and negative ALK gene translocation, now with oligo metastasis involving the left upper lobe and right lower lobe, recent bone scan and thoracic MRI showing osseous metastasis  The patient would be a good candidate to proceed with  post-operative radiation therapy directed at the right femur region. She was originally scheduled for treatment to the right femur but fell and fractured it requiring surgical pinning. Radiation should alleviate some pain from her metastatic disease in that area.  Plan: The patient is scheduled for CT simulation at 1PM today. I anticipate a short course of treatment (1 week) of radiation therapy since she would be transported by ambulance from the rehab facility. ____________________________________ -----------------------------------   Blair Promise, PhD, MD  This document serves as a record of services personally performed by Gery Pray, MD. It was created on his behalf by Darcus Austin, a trained medical scribe. The creation of this record is based on the scribe's personal observations and the provider's statements to them. This document has been checked and approved by the attending provider.

## 2016-10-14 NOTE — Progress Notes (Signed)
  Radiation Oncology         (336) [phone] ________________________________  Name: Tracy Dodson MRN: [id number]  Date: 10/14/2016  DOB: Jul 03, 1951  SIMULATION AND TREATMENT PLANNING NOTE    ICD-9-CM ICD-10-CM   1. Bone metastasis (HCC) 198.5 C79.51     DIAGNOSIS:  Recurrent non-small cell lung cancer initially diagnosed as stage IIIA (T1b N2M0) suspicious for adenocarcinoma with EGFR mutation in exon 19 and negative ALK gene translocation, now with oligo metastasis involving the left upper lobe and right lower lobe, recent bone scan and thoracic MRI showing osseous metastasis  NARRATIVE:  The patient was brought to the [facility].  Identity was confirmed.  All relevant records and images related to the planned course of therapy were reviewed.  The patient freely provided informed written consent to proceed with treatment after reviewing the details related to the planned course of therapy. The consent form was witnessed and verified by the simulation staff.  Then, the patient was set-up in a stable reproducible  supine position for radiation therapy.  CT images were obtained.  Surface markings were placed.  The CT images were loaded into the planning software.  Then the target and avoidance structures were contoured.  Treatment planning then occurred.  The radiation prescription was entered and confirmed.  Then, I designed and supervised the construction of a total of 3 medically necessary complex treatment devices.  I have requested : Isodose Plan.  I have ordered:dose calc.  PLAN:  The patient will receive 30 Gy in 10 fractions directed at the right femur.   -----------------------------------  Blair Promise, PhD, MD  This document serves as a record of services personally performed by Gery Pray, MD. It was created on his behalf by Darcus Austin, a trained medical scribe. The creation of this record is based on the scribe's personal observations and the provider's  statements to them. This document has been checked and approved by the attending provider.

## 2016-10-15 ENCOUNTER — Telehealth: Payer: Self-pay | Admitting: Oncology

## 2016-10-15 NOTE — Telephone Encounter (Signed)
Remo Lipps called and asked if we have received the paperwork from [employer] for Tracy Dodson's short term disability.  Advised her that we did receive it.

## 2016-10-16 ENCOUNTER — Encounter: Payer: Self-pay | Admitting: Radiation Oncology

## 2016-10-16 DIAGNOSIS — Z510 Encounter for antineoplastic radiation therapy: Secondary | ICD-10-CM | POA: Diagnosis not present

## 2016-10-17 ENCOUNTER — Inpatient Hospital Stay (HOSPITAL_COMMUNITY): Payer: PRIVATE HEALTH INSURANCE

## 2016-10-17 ENCOUNTER — Telehealth: Payer: Self-pay | Admitting: Medical Oncology

## 2016-10-17 ENCOUNTER — Emergency Department (HOSPITAL_COMMUNITY): Payer: PRIVATE HEALTH INSURANCE

## 2016-10-17 ENCOUNTER — Encounter (HOSPITAL_COMMUNITY): Payer: Self-pay | Admitting: Emergency Medicine

## 2016-10-17 ENCOUNTER — Inpatient Hospital Stay (HOSPITAL_COMMUNITY)
Admission: EM | Admit: 2016-10-17 | Discharge: 2016-10-31 | DRG: 064 | Disposition: E | Payer: PRIVATE HEALTH INSURANCE | Attending: Internal Medicine | Admitting: Internal Medicine

## 2016-10-17 ENCOUNTER — Ambulatory Visit: Payer: PRIVATE HEALTH INSURANCE | Admitting: Radiation Oncology

## 2016-10-17 ENCOUNTER — Telehealth: Payer: Self-pay | Admitting: Oncology

## 2016-10-17 DIAGNOSIS — C7931 Secondary malignant neoplasm of brain: Secondary | ICD-10-CM | POA: Diagnosis present

## 2016-10-17 DIAGNOSIS — G894 Chronic pain syndrome: Secondary | ICD-10-CM | POA: Diagnosis present

## 2016-10-17 DIAGNOSIS — A419 Sepsis, unspecified organism: Secondary | ICD-10-CM | POA: Diagnosis present

## 2016-10-17 DIAGNOSIS — G9341 Metabolic encephalopathy: Secondary | ICD-10-CM | POA: Diagnosis present

## 2016-10-17 DIAGNOSIS — I472 Ventricular tachycardia: Secondary | ICD-10-CM | POA: Diagnosis present

## 2016-10-17 DIAGNOSIS — E785 Hyperlipidemia, unspecified: Secondary | ICD-10-CM | POA: Diagnosis present

## 2016-10-17 DIAGNOSIS — I10 Essential (primary) hypertension: Secondary | ICD-10-CM | POA: Diagnosis present

## 2016-10-17 DIAGNOSIS — D72829 Elevated white blood cell count, unspecified: Secondary | ICD-10-CM

## 2016-10-17 DIAGNOSIS — R000 Tachycardia, unspecified: Secondary | ICD-10-CM

## 2016-10-17 DIAGNOSIS — Z6820 Body mass index (BMI) 20.0-20.9, adult: Secondary | ICD-10-CM

## 2016-10-17 DIAGNOSIS — S72001A Fracture of unspecified part of neck of right femur, initial encounter for closed fracture: Secondary | ICD-10-CM | POA: Diagnosis present

## 2016-10-17 DIAGNOSIS — Z7952 Long term (current) use of systemic steroids: Secondary | ICD-10-CM

## 2016-10-17 DIAGNOSIS — Z803 Family history of malignant neoplasm of breast: Secondary | ICD-10-CM

## 2016-10-17 DIAGNOSIS — I63412 Cerebral infarction due to embolism of left middle cerebral artery: Secondary | ICD-10-CM

## 2016-10-17 DIAGNOSIS — N390 Urinary tract infection, site not specified: Secondary | ICD-10-CM | POA: Diagnosis present

## 2016-10-17 DIAGNOSIS — Y92019 Unspecified place in single-family (private) house as the place of occurrence of the external cause: Secondary | ICD-10-CM | POA: Diagnosis not present

## 2016-10-17 DIAGNOSIS — I63432 Cerebral infarction due to embolism of left posterior cerebral artery: Secondary | ICD-10-CM | POA: Diagnosis present

## 2016-10-17 DIAGNOSIS — B961 Klebsiella pneumoniae [K. pneumoniae] as the cause of diseases classified elsewhere: Secondary | ICD-10-CM | POA: Diagnosis present

## 2016-10-17 DIAGNOSIS — G253 Myoclonus: Secondary | ICD-10-CM | POA: Diagnosis not present

## 2016-10-17 DIAGNOSIS — I639 Cerebral infarction, unspecified: Secondary | ICD-10-CM

## 2016-10-17 DIAGNOSIS — I638 Other cerebral infarction: Secondary | ICD-10-CM

## 2016-10-17 DIAGNOSIS — C342 Malignant neoplasm of middle lobe, bronchus or lung: Secondary | ICD-10-CM | POA: Diagnosis present

## 2016-10-17 DIAGNOSIS — F418 Other specified anxiety disorders: Secondary | ICD-10-CM | POA: Diagnosis present

## 2016-10-17 DIAGNOSIS — R0602 Shortness of breath: Secondary | ICD-10-CM

## 2016-10-17 DIAGNOSIS — N3000 Acute cystitis without hematuria: Secondary | ICD-10-CM

## 2016-10-17 DIAGNOSIS — I255 Ischemic cardiomyopathy: Secondary | ICD-10-CM | POA: Diagnosis present

## 2016-10-17 DIAGNOSIS — C7951 Secondary malignant neoplasm of bone: Secondary | ICD-10-CM | POA: Diagnosis present

## 2016-10-17 DIAGNOSIS — E871 Hypo-osmolality and hyponatremia: Secondary | ICD-10-CM | POA: Diagnosis present

## 2016-10-17 DIAGNOSIS — R4701 Aphasia: Secondary | ICD-10-CM | POA: Diagnosis present

## 2016-10-17 DIAGNOSIS — Z66 Do not resuscitate: Secondary | ICD-10-CM | POA: Diagnosis not present

## 2016-10-17 DIAGNOSIS — R29706 NIHSS score 6: Secondary | ICD-10-CM | POA: Diagnosis present

## 2016-10-17 DIAGNOSIS — Z923 Personal history of irradiation: Secondary | ICD-10-CM

## 2016-10-17 DIAGNOSIS — E440 Moderate protein-calorie malnutrition: Secondary | ICD-10-CM | POA: Diagnosis present

## 2016-10-17 DIAGNOSIS — E876 Hypokalemia: Secondary | ICD-10-CM | POA: Diagnosis present

## 2016-10-17 DIAGNOSIS — R778 Other specified abnormalities of plasma proteins: Secondary | ICD-10-CM

## 2016-10-17 DIAGNOSIS — Z515 Encounter for palliative care: Secondary | ICD-10-CM | POA: Diagnosis not present

## 2016-10-17 DIAGNOSIS — D649 Anemia, unspecified: Secondary | ICD-10-CM | POA: Diagnosis not present

## 2016-10-17 DIAGNOSIS — Z79891 Long term (current) use of opiate analgesic: Secondary | ICD-10-CM

## 2016-10-17 DIAGNOSIS — Z888 Allergy status to other drugs, medicaments and biological substances status: Secondary | ICD-10-CM

## 2016-10-17 DIAGNOSIS — Z87891 Personal history of nicotine dependence: Secondary | ICD-10-CM

## 2016-10-17 DIAGNOSIS — Z7901 Long term (current) use of anticoagulants: Secondary | ICD-10-CM

## 2016-10-17 DIAGNOSIS — Z8249 Family history of ischemic heart disease and other diseases of the circulatory system: Secondary | ICD-10-CM

## 2016-10-17 DIAGNOSIS — F329 Major depressive disorder, single episode, unspecified: Secondary | ICD-10-CM | POA: Diagnosis present

## 2016-10-17 DIAGNOSIS — D61818 Other pancytopenia: Secondary | ICD-10-CM

## 2016-10-17 DIAGNOSIS — R791 Abnormal coagulation profile: Secondary | ICD-10-CM

## 2016-10-17 DIAGNOSIS — I248 Other forms of acute ischemic heart disease: Secondary | ICD-10-CM | POA: Diagnosis present

## 2016-10-17 DIAGNOSIS — D6489 Other specified anemias: Secondary | ICD-10-CM | POA: Diagnosis present

## 2016-10-17 DIAGNOSIS — R7989 Other specified abnormal findings of blood chemistry: Secondary | ICD-10-CM

## 2016-10-17 DIAGNOSIS — D696 Thrombocytopenia, unspecified: Secondary | ICD-10-CM

## 2016-10-17 DIAGNOSIS — Z801 Family history of malignant neoplasm of trachea, bronchus and lung: Secondary | ICD-10-CM

## 2016-10-17 DIAGNOSIS — Z841 Family history of disorders of kidney and ureter: Secondary | ICD-10-CM

## 2016-10-17 DIAGNOSIS — Z902 Acquired absence of lung [part of]: Secondary | ICD-10-CM

## 2016-10-17 LAB — URINALYSIS, ROUTINE W REFLEX MICROSCOPIC
Bilirubin Urine: NEGATIVE
Glucose, UA: NEGATIVE mg/dL
Ketones, ur: 20 mg/dL — AB
Nitrite: NEGATIVE
Protein, ur: 30 mg/dL — AB
Specific Gravity, Urine: 1.019 (ref 1.005–1.030)
Squamous Epithelial / HPF: NONE SEEN
pH: 5 (ref 5.0–8.0)

## 2016-10-17 LAB — COMPREHENSIVE METABOLIC PANEL
ALK PHOS: 82 U/L (ref 38–126)
ALT: 11 U/L — ABNORMAL LOW (ref 14–54)
ALT: 11 U/L — AB (ref 14–54)
ANION GAP: 18 — AB (ref 5–15)
AST: 44 U/L — ABNORMAL HIGH (ref 15–41)
AST: 41 U/L (ref 15–41)
Albumin: 3.3 g/dL — ABNORMAL LOW (ref 3.5–5.0)
Albumin: 3 g/dL — ABNORMAL LOW (ref 3.5–5.0)
Alkaline Phosphatase: 87 U/L (ref 38–126)
Anion gap: 15 (ref 5–15)
BILIRUBIN TOTAL: 1.3 mg/dL — AB (ref 0.3–1.2)
BILIRUBIN TOTAL: 1 mg/dL (ref 0.3–1.2)
BUN: 10 mg/dL (ref 6–20)
BUN: 9 mg/dL (ref 6–20)
CALCIUM: 8.9 mg/dL (ref 8.9–10.3)
CO2: 21 mmol/L — ABNORMAL LOW (ref 22–32)
CO2: 21 mmol/L — ABNORMAL LOW (ref 22–32)
CREATININE: 0.64 mg/dL (ref 0.44–1.00)
Calcium: 9.3 mg/dL (ref 8.9–10.3)
Chloride: 92 mmol/L — ABNORMAL LOW (ref 101–111)
Chloride: 97 mmol/L — ABNORMAL LOW (ref 101–111)
Creatinine, Ser: 0.76 mg/dL (ref 0.44–1.00)
GFR calc Af Amer: >60 mL/min (ref >60)
Glucose, Bld: 118 mg/dL — ABNORMAL HIGH (ref 65–99)
Glucose, Bld: 91 mg/dL (ref 65–99)
Potassium: 3.6 mmol/L (ref 3.5–5.1)
Potassium: 3.5 mmol/L (ref 3.5–5.1)
Sodium: 131 mmol/L — ABNORMAL LOW (ref 135–145)
Sodium: 133 mmol/L — ABNORMAL LOW (ref 135–145)
TOTAL PROTEIN: 6.6 g/dL (ref 6.5–8.1)
TOTAL PROTEIN: 5.9 g/dL — AB (ref 6.5–8.1)

## 2016-10-17 LAB — CBC WITH DIFFERENTIAL/PLATELET
Basophils Absolute: 0 10*3/uL (ref 0.0–0.1)
Basophils Relative: 0 %
EOS PCT: 0 %
Eosinophils Absolute: 0 10*3/uL (ref 0.0–0.7)
HEMATOCRIT: 20 % — AB (ref 36.0–46.0)
HEMOGLOBIN: 6.1 g/dL — AB (ref 12.0–15.0)
LYMPHS ABS: 3.2 10*3/uL (ref 0.7–4.0)
Lymphocytes Relative: 14 %
MCH: 26.3 pg (ref 26.0–34.0)
MCHC: 30.5 g/dL (ref 30.0–36.0)
MCV: 86.2 fL (ref 78.0–100.0)
Monocytes Absolute: 3.9 10*3/uL — ABNORMAL HIGH (ref 0.1–1.0)
Monocytes Relative: 17 %
NEUTROS ABS: 16.1 10*3/uL — AB (ref 1.7–7.7)
NRBC: 1 /100{WBCs} — AB
Neutrophils Relative %: 69 %
PLATELETS: 34 10*3/uL — AB (ref 150–400)
RBC: 2.32 MIL/uL — AB (ref 3.87–5.11)
RDW: 19.7 % — ABNORMAL HIGH (ref 11.5–15.5)
WBC: 23.2 10*3/uL — AB (ref 4.0–10.5)

## 2016-10-17 LAB — CBC
HEMATOCRIT: 24.8 % — AB (ref 36.0–46.0)
HEMOGLOBIN: 7.5 g/dL — AB (ref 12.0–15.0)
MCH: 26.1 pg (ref 26.0–34.0)
MCHC: 30.2 g/dL (ref 30.0–36.0)
MCV: 86.4 fL (ref 78.0–100.0)
PLATELETS: 43 10*3/uL — AB (ref 150–400)
RBC: 2.87 MIL/uL — ABNORMAL LOW (ref 3.87–5.11)
RDW: 19.8 % — ABNORMAL HIGH (ref 11.5–15.5)
WBC: 38.9 10*3/uL — AB (ref 4.0–10.5)

## 2016-10-17 LAB — DIRECT ANTIGLOBULIN TEST (NOT AT ARMC)
DAT, IgG: NEGATIVE
DAT, complement: NEGATIVE

## 2016-10-17 LAB — DIFFERENTIAL
BASOS PCT: 0 %
Band Neutrophils: 21 %
Basophils Absolute: 0 10*3/uL (ref 0.0–0.1)
Blasts: 0 %
EOS ABS: 0 10*3/uL (ref 0.0–0.7)
EOS PCT: 0 %
Lymphocytes Relative: 8 %
Lymphs Abs: 3.1 10*3/uL (ref 0.7–4.0)
METAMYELOCYTES PCT: 2 %
MONO ABS: 3.9 10*3/uL — AB (ref 0.1–1.0)
MYELOCYTES: 2 %
Monocytes Relative: 10 %
NEUTROS ABS: 31.9 10*3/uL — AB (ref 1.7–7.7)
Neutrophils Relative %: 57 %
OTHER: 0 %
Promyelocytes Absolute: 0 %
WBC MORPHOLOGY: INCREASED
nRBC: 0 /100 WBC

## 2016-10-17 LAB — I-STAT TROPONIN, ED: TROPONIN I, POC: 0.33 ng/mL — AB (ref 0.00–0.08)

## 2016-10-17 LAB — RAPID URINE DRUG SCREEN, HOSP PERFORMED
Amphetamines: NOT DETECTED
Barbiturates: NOT DETECTED
Benzodiazepines: NOT DETECTED
Cocaine: NOT DETECTED
Opiates: POSITIVE — AB
Tetrahydrocannabinol: NOT DETECTED

## 2016-10-17 LAB — I-STAT CHEM 8, ED
BUN: 12 mg/dL (ref 6–20)
CALCIUM ION: 1.13 mmol/L — AB (ref 1.15–1.40)
Chloride: 91 mmol/L — ABNORMAL LOW (ref 101–111)
Creatinine, Ser: 0.7 mg/dL (ref 0.44–1.00)
Glucose, Bld: 123 mg/dL — ABNORMAL HIGH (ref 65–99)
HCT: 24 % — ABNORMAL LOW (ref 36.0–46.0)
Hemoglobin: 8.2 g/dL — ABNORMAL LOW (ref 12.0–15.0)
Potassium: 3.6 mmol/L (ref 3.5–5.1)
SODIUM: 131 mmol/L — AB (ref 135–145)
TCO2: 23 mmol/L (ref 0–100)

## 2016-10-17 LAB — I-STAT CG4 LACTIC ACID, ED: Lactic Acid, Venous: 1.81 mmol/L (ref 0.5–1.9)

## 2016-10-17 LAB — LACTATE DEHYDROGENASE: LDH: 724 U/L — ABNORMAL HIGH (ref 98–192)

## 2016-10-17 LAB — VAS US CAROTID
RCCAPSYS: 114 cm/s
RIGHT ECA DIAS: -13 cm/s
Right CCA prox dias: 27 cm/s

## 2016-10-17 LAB — PROTIME-INR
INR: 2.36
INR: 2.31
Prothrombin Time: 26.2 seconds — ABNORMAL HIGH (ref 11.4–15.2)
Prothrombin Time: 25.8 seconds — ABNORMAL HIGH (ref 11.4–15.2)

## 2016-10-17 LAB — ETHANOL: Alcohol, Ethyl (B): <5 mg/dL

## 2016-10-17 LAB — LACTIC ACID, PLASMA: LACTIC ACID, VENOUS: 1.5 mmol/L (ref 0.5–1.9)

## 2016-10-17 LAB — TROPONIN I
TROPONIN I: 0.5 ng/mL — AB (ref <0.03)
TROPONIN I: 0.42 ng/mL — AB (ref <0.03)
TROPONIN I: 0.48 ng/mL — AB (ref <0.03)

## 2016-10-17 LAB — RETICULOCYTES
RBC.: 2.37 MIL/uL — ABNORMAL LOW (ref 3.87–5.11)
RETIC CT PCT: 5.8 % — AB (ref 0.4–3.1)
Retic Count, Absolute: 137.5 10*3/uL (ref 19.0–186.0)

## 2016-10-17 LAB — FIBRINOGEN: Fibrinogen: 368 mg/dL (ref 210–475)

## 2016-10-17 LAB — CBG MONITORING, ED: GLUCOSE-CAPILLARY: 124 mg/dL — AB (ref 65–99)

## 2016-10-17 LAB — OSMOLALITY: Osmolality: 276 mOsm/kg (ref 275–295)

## 2016-10-17 LAB — TECHNOLOGIST SMEAR REVIEW

## 2016-10-17 LAB — PROCALCITONIN: PROCALCITONIN: 1.86 ng/mL

## 2016-10-17 LAB — APTT
aPTT: 50 seconds — ABNORMAL HIGH (ref 24–36)
aPTT: 52 seconds — ABNORMAL HIGH (ref 24–36)

## 2016-10-17 LAB — PREPARE RBC (CROSSMATCH)

## 2016-10-17 LAB — T4, FREE: FREE T4: 1.31 ng/dL — AB (ref 0.61–1.12)

## 2016-10-17 LAB — OSMOLALITY, URINE: Osmolality, Ur: 481 mOsm/kg (ref 300–900)

## 2016-10-17 MED ORDER — LORAZEPAM 2 MG/ML IJ SOLN
INTRAMUSCULAR | Status: AC
  Filled 2016-10-17: qty 1

## 2016-10-17 MED ORDER — LORAZEPAM 2 MG/ML IJ SOLN: 1.0000 mg | Freq: Once | INTRAMUSCULAR | Status: DC

## 2016-10-17 MED ORDER — ACETAMINOPHEN 650 MG RE SUPP
650.0000 mg | RECTAL | Status: DC | PRN
  Administered 2016-10-17: 650 mg via RECTAL
  Filled 2016-10-17: qty 1

## 2016-10-17 MED ORDER — OSIMERTINIB MESYLATE 80 MG PO TABS
80.0000 mg | ORAL_TABLET | Freq: Every day | ORAL | Status: DC
  Filled 2016-10-17: qty 1

## 2016-10-17 MED ORDER — PIPERACILLIN-TAZOBACTAM 3.375 G IVPB
3.3750 g | Freq: Three times a day (TID) | INTRAVENOUS | Status: DC
  Administered 2016-10-17 – 2016-10-18 (×3): 3.375 g via INTRAVENOUS
  Filled 2016-10-17 (×5): qty 50

## 2016-10-17 MED ORDER — HYDROCODONE-ACETAMINOPHEN 7.5-325 MG PO TABS: 1.0000 | ORAL_TABLET | ORAL | Status: DC | PRN

## 2016-10-17 MED ORDER — SODIUM CHLORIDE 0.9% FLUSH
10.0000 mL | INTRAVENOUS | Status: DC | PRN
  Administered 2016-10-18: 10 mL
  Filled 2016-10-17: qty 40

## 2016-10-17 MED ORDER — OXYCODONE HCL ER 15 MG PO T12A: 40.0000 mg | EXTENDED_RELEASE_TABLET | Freq: Two times a day (BID) | ORAL | Status: DC

## 2016-10-17 MED ORDER — SENNOSIDES-DOCUSATE SODIUM 8.6-50 MG PO TABS: 1.0000 | ORAL_TABLET | Freq: Every evening | ORAL | Status: DC | PRN

## 2016-10-17 MED ORDER — LORAZEPAM 2 MG/ML IJ SOLN: 0.5000 mg | Freq: Once | INTRAMUSCULAR | Status: DC

## 2016-10-17 MED ORDER — BISACODYL 10 MG RE SUPP: 10.0000 mg | Freq: Every day | RECTAL | Status: DC | PRN

## 2016-10-17 MED ORDER — HALOPERIDOL LACTATE 5 MG/ML IJ SOLN
2.0000 mg | Freq: Four times a day (QID) | INTRAMUSCULAR | Status: DC | PRN
  Administered 2016-10-17 (×2): 2 mg via INTRAVENOUS
  Filled 2016-10-17 (×2): qty 1

## 2016-10-17 MED ORDER — ACETAMINOPHEN 160 MG/5ML PO SOLN: 650.0000 mg | ORAL | Status: DC | PRN

## 2016-10-17 MED ORDER — PREDNISONE 1 MG PO TABS
2.0000 mg | ORAL_TABLET | Freq: Every day | ORAL | Status: DC
  Filled 2016-10-17: qty 2

## 2016-10-17 MED ORDER — IOPAMIDOL (ISOVUE-370) INJECTION 76%
INTRAVENOUS | Status: AC
  Filled 2016-10-17: qty 100

## 2016-10-17 MED ORDER — LORAZEPAM 2 MG/ML IJ SOLN
0.5000 mg | INTRAMUSCULAR | Status: AC | PRN
  Administered 2016-10-17 (×2): 0.5 mg via INTRAVENOUS

## 2016-10-17 MED ORDER — PIPERACILLIN-TAZOBACTAM 3.375 G IVPB 30 MIN
3.3750 g | Freq: Once | INTRAVENOUS | Status: AC
  Administered 2016-10-17: 3.375 g via INTRAVENOUS
  Filled 2016-10-17: qty 50

## 2016-10-17 MED ORDER — APIXABAN 2.5 MG PO TABS: 2.5000 mg | ORAL_TABLET | Freq: Two times a day (BID) | ORAL | Status: DC

## 2016-10-17 MED ORDER — SULFAMETHOXAZOLE-TRIMETHOPRIM 800-160 MG PO TABS: 1.0000 | ORAL_TABLET | Freq: Two times a day (BID) | ORAL | Status: DC

## 2016-10-17 MED ORDER — ASPIRIN 81 MG PO CHEW: 81.0000 mg | CHEWABLE_TABLET | Freq: Once | ORAL | Status: DC

## 2016-10-17 MED ORDER — HYDROCORTISONE NA SUCCINATE PF 100 MG IJ SOLR
50.0000 mg | Freq: Three times a day (TID) | INTRAMUSCULAR | Status: DC
  Administered 2016-10-17 – 2016-10-19 (×6): 50 mg via INTRAVENOUS
  Filled 2016-10-17 (×6): qty 2

## 2016-10-17 MED ORDER — ALTEPLASE 2 MG IJ SOLR
2.0000 mg | Freq: Once | INTRAMUSCULAR | Status: AC
  Administered 2016-10-17: 2 mg
  Filled 2016-10-17: qty 2

## 2016-10-17 MED ORDER — LORAZEPAM 2 MG/ML IJ SOLN
1.0000 mg | Freq: Once | INTRAMUSCULAR | Status: AC
  Administered 2016-10-17: 1 mg via INTRAVENOUS

## 2016-10-17 MED ORDER — ASPIRIN 325 MG PO TABS: 325.0000 mg | ORAL_TABLET | Freq: Every day | ORAL | Status: DC

## 2016-10-17 MED ORDER — LORAZEPAM 2 MG/ML IJ SOLN
1.0000 mg | Freq: Once | INTRAMUSCULAR | Status: AC
  Administered 2016-10-17: 1 mg via INTRAVENOUS
  Filled 2016-10-17: qty 1

## 2016-10-17 MED ORDER — CITALOPRAM HYDROBROMIDE 20 MG PO TABS: 20.0000 mg | ORAL_TABLET | Freq: Every day | ORAL | Status: DC

## 2016-10-17 MED ORDER — PREDNISONE 1 MG PO TABS: 3.0000 mg | ORAL_TABLET | Freq: Every day | ORAL | Status: DC

## 2016-10-17 MED ORDER — SODIUM CHLORIDE 0.9 % IV BOLUS (SEPSIS)
1000.0000 mL | Freq: Once | INTRAVENOUS | Status: AC
  Administered 2016-10-17: 1000 mL via INTRAVENOUS

## 2016-10-17 MED ORDER — ASPIRIN 300 MG RE SUPP: 300.0000 mg | Freq: Every day | RECTAL | Status: DC

## 2016-10-17 MED ORDER — ALTEPLASE 2 MG IJ SOLR: 2.0000 mg | Freq: Once | INTRAMUSCULAR | Status: DC

## 2016-10-17 MED ORDER — PANTOPRAZOLE SODIUM 40 MG PO TBEC: 40.0000 mg | DELAYED_RELEASE_TABLET | Freq: Every day | ORAL | Status: DC

## 2016-10-17 MED ORDER — ACETAMINOPHEN 325 MG PO TABS: 650.0000 mg | ORAL_TABLET | ORAL | Status: DC | PRN

## 2016-10-17 MED ORDER — ENSURE ENLIVE PO LIQD: 237.0000 mL | Freq: Two times a day (BID) | ORAL | Status: DC

## 2016-10-17 MED ORDER — STROKE: EARLY STAGES OF RECOVERY BOOK
Freq: Once | Status: AC
  Administered 2016-10-17: 09:00:00
  Filled 2016-10-17: qty 1

## 2016-10-17 MED ORDER — SODIUM CHLORIDE 0.9 % IV SOLN: Freq: Once | INTRAVENOUS | Status: DC

## 2016-10-17 MED ORDER — VANCOMYCIN HCL IN DEXTROSE 1-5 GM/200ML-% IV SOLN
1000.0000 mg | Freq: Once | INTRAVENOUS | Status: AC
  Administered 2016-10-17: 1000 mg via INTRAVENOUS
  Filled 2016-10-17: qty 200

## 2016-10-17 MED ORDER — FENTANYL 25 MCG/HR TD PT72
25.0000 ug | MEDICATED_PATCH | TRANSDERMAL | Status: DC
  Administered 2016-10-17: 25 ug via TRANSDERMAL
  Filled 2016-10-17: qty 1

## 2016-10-17 MED ORDER — POLYETHYLENE GLYCOL 3350 17 G PO PACK: 17.0000 g | PACK | Freq: Every day | ORAL | Status: DC | PRN

## 2016-10-17 MED ORDER — VANCOMYCIN HCL IN DEXTROSE 750-5 MG/150ML-% IV SOLN
750.0000 mg | Freq: Two times a day (BID) | INTRAVENOUS | Status: DC
  Administered 2016-10-18 – 2016-10-19 (×3): 750 mg via INTRAVENOUS
  Filled 2016-10-17 (×4): qty 150

## 2016-10-17 NOTE — Telephone Encounter (Signed)
Tracy Dodson called and stated pt had a stroke and is at [facility] . She stated that the term Palliative care has been discussed. I told Tracy Dodson that Dr Julien Nordmann is aware of events and is in agreement with palliative care .

## 2016-10-17 NOTE — ED Triage Notes (Signed)
Per [facility]: Pt to ED from [facility] for stroke-like symptoms (expressive aphasia, "not looking to her right side") since 0400 this morning. Pt at [facility] for rehab hip fx, staff checked on patient and stated to EMS that patient was not acting herself. Hx brain tumor, on Eliquis. EMS VS: HR 138 ST, 95% RA, 116/76, CBG 123. Pt alert and oriented to time.

## 2016-10-17 NOTE — ED Notes (Signed)
Patient's husband at bedside. Dr. Leonel Ramsay paged.

## 2016-10-17 NOTE — ED Notes (Signed)
Pt returned to room from the imaging department.
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
## 2016-10-17 NOTE — Progress Notes (Signed)
Palliative consult received.   Plan to meet husband tomorrow morning around 9am.   Mariana Kaufman, AGNP-C Palliative Medicine  Please call Palliative Medicine team phone with any questions [phone]. For individual providers please see AMION.

## 2016-10-17 NOTE — Progress Notes (Signed)
STROKE TEAM PROGRESS NOTE   HISTORY OF PRESENT ILLNESS (per record) Tracy Dodson is a 66 y.o. female who was noticed to be abnormal during the 10:57 PM shift at [facility], but she was still able to make some sense ("she seemed okay to me" per the nurse that had her tonight).   Around 3:30a, however she called out for help and the nurse did notice that something was wrong at that time. Due to her aphasia code stroke was activated.  Though her history is largely unreliable, she is able to answer appropriately that she was not feeling normal when she woke up at 3:30 AM. This is established both by affirmative and negative responses and multiple ways and she was consistent.  Her LKW is unclear, but at least prior to 11 PM 10/16/16 she was normal. Patient was not administered IV t-PA secondary to being on apixaban. She was admitted for further evaluation and treatment.   SUBJECTIVE (INTERVAL HISTORY) Husband at bedside. Pt restless in bed, right neglect, intermittent myoclonus vs. Asterixis, aphasia and not following commands. Anemia worsening, will have blood transfusion. Worsening thrombocytopenia and elevated INR, will avoid antithrombotics at this time. Recommend palliative care consult.    OBJECTIVE Temp:  [97.4 F (36.3 C)-100.6 F (38.1 C)] 97.4 F (36.3 C) (01/18 0910) Pulse Rate:  [121-134] 124 (01/18 0910) Cardiac Rhythm: Sinus tachycardia (01/18 0940) Resp:  [16-29] 16 (01/18 0910) BP: (111-135)/(51-80) 111/56 (01/18 0910) SpO2:  [92 %-100 %] 94 % (01/18 0910) Weight:  [56.3 kg (124 lb 1.9 oz)] 56.3 kg (124 lb 1.9 oz) (01/18 0454)  CBC:  Recent Labs Lab 10/19/2016 0441 10/03/2016 0500  WBC 38.9*  --   NEUTROABS 31.9*  --   HGB 7.5* 8.2*  HCT 24.8* 24.0*  MCV 86.4  --   PLT 43*  --     Basic Metabolic Panel:  Recent Labs Lab 10/03/2016 0441 09/30/2016 0500  NA 131* 131*  K 3.6 3.6  CL 92* 91*  CO2 21*  --   GLUCOSE 118* 123*  BUN 10 12  CREATININE 0.76 0.70   CALCIUM 9.3  --     Lipid Panel: No results found for: CHOL, TRIG, HDL, CHOLHDL, VLDL, LDLCALC HgbA1c: No results found for: HGBA1C Urine Drug Screen:    Component Value Date/Time   LABOPIA POSITIVE (A) 10/03/2016 0541   COCAINSCRNUR NONE DETECTED 10/20/2016 0541   LABBENZ NONE DETECTED 10/22/2016 0541   AMPHETMU NONE DETECTED 10/18/2016 0541   THCU NONE DETECTED 10/08/2016 0541   LABBARB NONE DETECTED 10/19/2016 0541      IMAGING I have personally reviewed the radiological images below and agree with the radiology interpretations.  Ct Head Code Stroke W/o Cm 10/27/2016 1. Motion degraded study, suboptimal assessment for subtle infarct or hemorrhage. 2. Wedge-shaped focus of hypoattenuation within the left temporal occipital junction with loss of gray-white differentiation probably representing an area of acute infarction. 3. ASPECTS is 10   Mr Brain Wo Contrast 10/08/2016 1. Patchy moderate sized acute infarcts in the left MCA and PCA distributions. Patient has a large left posterior communicating artery by previous imaging. 2. Treated cerebral metastatic disease with right occipital cavity. 3. Limited exam due to patient condition. Only motion degraded diffusion and FLAIR imaging could be obtained.   EEG - This is an abnormal EEG due to moderate generalized slowing and FIRDA. Clinical Correlation These findings suggest a moderately severe encephalopathy, likely metabolic in nature.  There is no evidence for seizure activity at this  time.  CUS - limited due to patient constantly moving head. Limited views of Right ICA, but appears to be 1-39% stenosis. Unable to image Left side due to patient pushing me away.  TTE - pending   PHYSICAL EXAM  Temp:  [97.4 F (36.3 C)-100.6 F (38.1 C)] 97.4 F (36.3 C) (01/18 0910) Pulse Rate:  [121-134] 124 (01/18 0910) Resp:  [16-29] 16 (01/18 0910) BP: (111-135)/(51-80) 111/56 (01/18 0910) SpO2:  [92 %-100 %] 94 % (01/18 0910) Weight:   [124 lb 1.9 oz (56.3 kg)] 124 lb 1.9 oz (56.3 kg) (01/18 0454)  General - cachectic female, well developed, restless and not following commands.  Ophthalmologic - Fundi not visualized due to noncooperation.  Cardiovascular - Regular rate and rhythm.  Neuro - restless in bed, not following commands, intermittent open eyes, left gaze preference but able to cross midline. Right neglect, not blinking to visual threat bilaterally, able to move head to right on voice stimulation, but not to left. Word salad, not make sense, but able to have sound out, not cooperative on naming or repetition. PERRL, right mild nasolabial fold flattening, tongue in middle inside mouth. Moving all extremities symmetrically and equally, however, restless with intermittent myoclonus. DTR 2+ and equivocal babinski bilaterally, not cooperative on exam. Sensation, coordination and gait not tested.    ASSESSMENT/PLAN Ms. Tracy Dodson is a 66 y.o. female with history of lung Ca metastatic to brain and right hip, pancytopenia, recent RIGHT hip fracture and kyphoplasty with T10 fracture presenting with aphasia, restless, and AMS. She did not receive IV t-PA due to being on apixaban.   Stroke:  Patchy left MCA, MCA/PCA infarcts, embolic pattern concerning related to hypercoagulable state secondary to advance lung cancer  Resultant  Aphasia and encephalopathy  MRI  Patchy L MCA and MCA/PCA infarcts. Treated R occipital metastatic disease.  MRA  Not ordered, as it would not change management  Carotid Doppler  Right ICA patent, left ICA can not see due to not cooperative  2D Echo  pending  LDL pending   HgbA1c pending  SCDs for VTE prophylaxis  Diet NPO time specified  Eliquis (apixaban) daily prior to admission, now on no antithrombotics due to thrombocytopenia and elevated INR.   Patient counseled to be compliant with her antithrombotic medications  Ongoing aggressive stroke risk factor management  Therapy  recommendations:  pending   Disposition:  pending (previous lived home with husband, at [facility] for rehab following hip fx 12/17 and kyphoplasty 1/18)  Acute encephalopathy  EEG showed diffuse slow with FIRDA - no seizure  myoclonous vs. Asterixis  Could be related to severe anemia, thrombocytopenia, leukocytosis  UTI with Possible sepsis  Elevated troponin - TTE pending  Anemia, thrombocytopenia, leukocytosis  Hb 7.5->6.1 - blood transfusion  Plat 43->34 - no ASA at this time  WBC 38.9->23.2 - UTI and possible sepsis on Abx with zosyn and vanco vs. Steroids PTA for PMR  INR 2.36->2.31 - no anticoagulation this time  ? DIC - fibrinogen WNL  Peripheral smear - no schistocytes  Lung cancer with metastasis to bone, brain  Recommend palliative care consult  Was on eliquis for DVT prophylaxis due to hip fracture  No AC at this time due to elevated INR and anemia  Hypertension  Stable  Permissive hypertension (OK if < 180/105) but gradually normalize in 5-7 days  Long-term BP goal normotensive  Hyperlipidemia  Home meds:  No statin  LDL pending, goal < 70  Other Stroke Risk  Factors  ETOH use, advised to drink no more than 1 drink(s) a day  Other Active Problems  kyphoplasty 1/18  Elevated TSH  Chronic tachycardia  Hx PMR - on steroid PTA  Hospital day # 0  The patient is critically ill with stroke, severe anemia, thrombocytopenia, elevated INR, urosepsis, encephalopathy at risk for recurrent strokes and neurological worsening and sepsis, DIC, heart failure, respiratory failure. She needs ongoing stroke and medical evaluation and monitoring.  Rosalin Hawking, MD PhD Stroke Neurology 10/24/2016 5:37 PM   To contact Stroke Continuity provider, please refer to [email or web address] After hours, contact General Neurology
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
## 2016-10-17 NOTE — ED Notes (Signed)
Patient returned to room. Unable to get clear answer on whether or not patient can have contrast for CT Perfusion scan d/t previous documented allergy to contrast dye/media from a PET scan. Per Rad tech, PET scan and CT perfusion scan have same contrast medium.

## 2016-10-17 NOTE — Progress Notes (Addendum)
[facility] Progress Note  Patient: Tracy Dodson QQV:[id number]   PCP: [name], MD DOB: 23-Mar-1951   DOA: 10/22/2016   DOS: 10/29/2016   Date of Service: the patient was seen and examined on 10/04/2016  Brief hospital course: Pt. with PMH of NSCLC with bone and brain metastasis, on chemotherapy and radiation, HTN, PMR, mood disorder, recent right hip fracture and kyphoplasty T10; admitted on 10/15/2016, with complaint of confusion and difficulty speaking, was found to have acute encephalopathy as well as acute ischemic stroke and suspected sepsis. Currently further plan is supportive management at present.  Assessment and Plan: 1. Stroke [facility]) Acute left MCA and PCA territory infarct embolic in nature. Management is difficult given thrombocytopenia. Patient was taking Apixaban for DVT prophylaxis dose despite which she has developed the stroke. Holding anticoagulation while the INR is more than 2. Antiplatelet would not be ideal given thrombocytopenia. Neurology consulted. PTOT and speech therapy consulted, patient on regular consistency diet per speech therapy although appears high risk for aspiration therefore nothing by mouth at present. Vascular Doppler and echocardiogram was ordered.  2.acute encephalopathy. Etiology currently unclear and probably multifactorial. Acute stroke, possible seizures, metabolic encephalopathy with sepsis, polypharmacy, opiate withdrawal. At present EEG unremarkable for any focal seizures. MRI brain does not show any new metastatic lesions. Currently we will provide supportive management We will use when necessary Haldol for agitation control as well as restrain as needed. IV fluids will be provided. Patient started on empiric IV antibiotics with vancomycin and Zosyn. Blood cultures performed. We'll start her on thiamine and folic acid as well as P-32 one dose. Monitor for improvement.  3. Suspected sepsis. Patient with low-grade temperature,  tachycardia, hypotension, tachypnea as well as acute encephalopathy. Meeting sepsis criteria. Lactic acid normal. Chest x-ray clear. UA shows evidence of UTI, recently started on oral Bactrim as an outpatient. I will broaden the coverage to vancomycin and Zosyn. Blood cultures ordered.  4. Acute anemia, acute on chronic thrombocytopenia, leukocytosis. Etiology of these findings is currently unclear rate Possibility of sepsis causing bicytopenia cannot be ruled out. Without any schistocytes TTP less likely, with normal fibrinogen HUS DIC less likely. We will provide supportive care with blood transfusion 2 units PRBC. Continue to monitor on telemetry at present. Check FOBT. IV PPI. Nothing by mouth at present. Recheck CBC post laceration, transfuse for hemoglobin less than 7.  5.elevated troponin. Sinus tachycardia. Patient's troponin elevation as well as tachycardia is likely response to demand ischemia. We will check echocardiogram. Unable to use any antibiotic regulation or aspirin given severe thrombocytopenia as well as anemia.  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 Chronic pain syndrome. Currently given acute sepsis on holding patient's chemotherapy regimen. Discussed with patient's primary oncologist Dr. Julien Nordmann Patient was on chronic prednisone I would currently use IV Solu Cortef. Patient was on oral OxyContin 40 mg every 12 hours for pain control, equivalent dose of which would be ideally 50 mics of fentanyl patch given patient's acute encephalopathy I would give the patient 25 mics of fentanyl patch, continue Norco.  7. Goals of care discussion. On admission patient was full code, I discussed with patient's family regarding their goals of care, family responded the patient has recently mentioned about being DO NOT RESUSCITATE. Husband at bedside agreed with that and CODE STATUS changed to DO NOT RESUSCITATE DO NOT  INTUBATE. Patient with multiple comorbidities at present, suspected sepsis, acute encephalopathy. Has severe thrombo-cytopenia and anemia limiting use of  antiplatelets as well as anticoagulation medication which is accessory for secondary stroke prevention in a patient with acute stroke. Patient also has multisystem organ failure likely given elevated troponin levels. At this point patient with high likelihood of not surviving this hospitalization, even if patient survives this hospitalization long-term prognosis is limited due to presence of non-small cell lung cancer and chronic, cytopenia. neurology as well as medical oncology both recommended palliative care consult. Discussed with family was present with talking with palliative care for further goals of care discussion.   Bowel regimen: last BM prior to admission  Diet:  nothing by mouth until more awake DVT Prophylaxis: mechanical compression device  Advance goals of care discussion: see above  Family Communication: family was present at bedside, at the time of interview. The pt provided permission to discuss medical plan with the family. Opportunity was given to ask question and all questions were answered satisfactorily.   Disposition:  Discharge to snf vs inpatient hospice. Expected discharge date: 10/23/2016 pending discussion with palliative care and respond to treatment   Consultants: Neurology, palliative care, phone consultation with medical oncology Procedures: Echocardiogram, carotid Doppler   Antibiotics: Anti-infectives    Start     Dose/Rate Route Frequency Ordered Stop   10/18/16 0400  vancomycin (VANCOCIN) IVPB 750 mg/150 ml premix     750 mg 150 mL/hr over 60 Minutes Intravenous Every 12 hours 10/11/2016 1443     10/25/2016 2200  piperacillin-tazobactam (ZOSYN) IVPB 3.375 g     3.375 g 12.5 mL/hr over 240 Minutes Intravenous Every 8 hours 09/30/2016 1443     10/15/2016 1300  piperacillin-tazobactam (ZOSYN) IVPB 3.375 g      3.375 g 100 mL/hr over 30 Minutes Intravenous  Once 10/02/2016 1248 10/15/2016 1426   10/13/2016 1300  vancomycin (VANCOCIN) IVPB 1000 mg/200 mL premix     1,000 mg 200 mL/hr over 60 Minutes Intravenous  Once 10/10/2016 1248     10/26/2016 1000  sulfamethoxazole-trimethoprim (BACTRIM DS,SEPTRA DS) 800-160 MG per tablet 1 tablet  Status:  Discontinued     1 tablet Oral 2 times daily 10/05/2016 0900 10/16/2016 1248        Subjective: Confused, not following any command, and incompressible speech   Objective: Physical Exam: Vitals:   10/05/2016 0630 10/05/2016 0700 10/22/2016 0838 10/29/2016 0910  BP:  (!) 120/53  (!) 111/56  Pulse: (!) 121 (!) 121  (!) 124  Resp: '23 21  16  '$ Temp:   100.6 F (38.1 C) 97.4 F (36.3 C)  TempSrc:   Rectal Oral  SpO2: 100% 96%  94%  Weight:      Height:        Intake/Output Summary (Last 24 hours) at 09/30/2016 1658 Last data filed at 10/14/2016 1525  Gross per 24 hour  Intake               50 ml  Output               20 ml  Net               30 ml   Filed Weights   10/14/2016 0454  Weight: 56.3 kg (124 lb 1.9 oz)    General: Confused, not following any command, and incompressible speech  Appear in moderate distress, Eyes: PERRL, Conjunctiva normal ENT: Oral Mucosa clear moist. Neck: difficult to assess JVD, no Abnormal Mass Or lumps Cardiovascular: S1 and S2 Present, no Murmur, Respiratory: Bilateral Air entry equal and Decreased, no use of  accessory muscle, Clear to Auscultation, no Crackles, no wheezes Abdomen: Bowel Sound present, Soft and no tenderness Skin: no redness, no Rash, no induration Extremities: trace Pedal edema, no calf tenderness Neurologic: Confused, not following any command, and incompressible speech   moving all extremity is adequately, withdraws to painful stimuli   Data Reviewed: CBC:  Recent Labs Lab 10/24/2016 0441 10/18/2016 0500 10/10/2016 1315  WBC 38.9*  --  23.2*  NEUTROABS 31.9*  --  16.1*  HGB 7.5* 8.2* 6.1*  HCT 24.8* 24.0*  20.0*  MCV 86.4  --  86.2  PLT 43*  --  34*   Basic Metabolic Panel:  Recent Labs Lab 10/18/2016 0441 10/03/2016 0500 10/15/2016 1315  NA 131* 131* 133*  K 3.6 3.6 3.5  CL 92* 91* 97*  CO2 21*  --  21*  GLUCOSE 118* 123* 91  BUN '10 12 9  '$ CREATININE 0.76 0.70 0.64  CALCIUM 9.3  --  8.9    Liver Function Tests:  Recent Labs Lab 10/26/2016 0441 10/18/2016 1315  AST 44* 41  ALT 11* 11*  ALKPHOS 87 82  BILITOT 1.3* 1.0  PROT 6.6 5.9*  ALBUMIN 3.3* 3.0*   No results for input(s): LIPASE, AMYLASE in the last 168 hours. No results for input(s): AMMONIA in the last 168 hours. Coagulation Profile:  Recent Labs Lab 10/28/2016 0441 10/22/2016 1315  INR 2.36 2.31   Cardiac Enzymes:  Recent Labs Lab 10/03/2016 1051 10/23/2016 1315  TROPONINI 0.50* 0.42*   BNP (last 3 results) No results for input(s): PROBNP in the last 8760 hours.  CBG:  Recent Labs Lab 10/30/2016 0500  GLUCAP 124*    Studies: Mr Brain Wo Contrast  Result Date: 10/14/2016 CLINICAL DATA:  Altered mental status. EXAM: MRI HEAD WITHOUT CONTRAST TECHNIQUE: Multiplanar, multiecho pulse sequences of the brain and surrounding structures were obtained without intravenous contrast. COMPARISON:  07/22/2016 FINDINGS: Brain: Patchy acute infarct in the posterior left temporal and left occipital cortex and subjacent white matter. The largest area of confluent infarct measures up to 4 cm. Smaller cortical infarct in the high posterior left frontal region. These areas show early cytotoxic edema on FLAIR imaging. Cavity in the right occipital cortex related to treated metastatic disease. No abnormal signal or mass effect in this region. No hydrocephalus or midline shift. Vascular: Present flow voids at the carotid siphon level. There could be abnormal flow within a left insular vessel on 8:13, but indeterminate due to motion artifact. IMPRESSION: 1. Patchy moderate sized acute infarcts in the left MCA and PCA distributions. Patient  has a large left posterior communicating artery by previous imaging. 2. Treated cerebral metastatic disease with right occipital cavity. 3. Limited exam due to patient condition. Only motion degraded diffusion and FLAIR imaging could be obtained. Electronically Signed   By: Monte Fantasia M.D.   On: 10/04/2016 08:58   Dg Chest Port 1 View  Result Date: 10/04/2016 CLINICAL DATA:  Tachycardia.  Evaluate for sepsis. EXAM: PORTABLE CHEST 1 VIEW COMPARISON:  September 20, 2016 FINDINGS: Evaluation is limited due to patient positioning with her head slumped over the upper chest. No pneumothorax. The cardiomediastinal silhouette is normal. A right Port-A-Cath remains in place. No pulmonary nodules, masses, or focal infiltrates identified on the limited study. IMPRESSION: The study is limited due to patient positioning. Stable right Port-A-Cath. No acute abnormalities seen within the lungs. Electronically Signed   By: Dorise Bullion III M.D   On: 10/01/2016 16:29   Ct Head Code Stroke  W/o Cm  Result Date: 10/03/2016 CLINICAL DATA:  Code stroke.  66 y/o  F; stroke-like symptoms. EXAM: CT HEAD WITHOUT CONTRAST TECHNIQUE: Contiguous axial images were obtained from the base of the skull through the vertex without intravenous contrast. COMPARISON:  07/22/2016 MRI head.  05/06/2006 CT head. FINDINGS: Brain: Motion degraded. Right occipital resection cavity stable from prior MRI given differences in technique. Wedge-shaped focus of hypoattenuation within the left temporal occipital junction with loss of gray-white differentiation (series 201, image 11) probably representing an area of infarction. No definite acute intracranial hemorrhage identified. Vascular: Motion degraded at skullbase. Skull: Right occipital craniotomy changes. Sinuses/Orbits: Motion degraded. Other: None. ASPECTS [facility] Stroke Program Early CT Score) - Ganglionic level infarction (caudate, lentiform nuclei, internal capsule, insula, M1-M3 cortex): 7  - Supraganglionic infarction (M4-M6 cortex): 3 Total score (0-10 with 10 being normal): 10 IMPRESSION: 1. Motion degraded study, suboptimal assessment for subtle infarct or hemorrhage. 2. Wedge-shaped focus of hypoattenuation within the left temporal occipital junction with loss of gray-white differentiation probably representing an area of acute infarction. 3. ASPECTS is 10 These results were called by telephone at the time of interpretation on 10/29/2016 at 4:56 am to Dr. Leonel Ramsay, who verbally acknowledged these results. Electronically Signed   By: Kristine Garbe M.D.   On: 10/20/2016 04:58     Scheduled Meds: . sodium chloride   Intravenous Once  . fentaNYL  25 mcg Transdermal Q72H  . hydrocortisone sod succinate (SOLU-CORTEF) inj  50 mg Intravenous Q8H  . iopamidol      . LORazepam      . piperacillin-tazobactam (ZOSYN)  IV  3.375 g Intravenous Q8H  . sodium chloride  1,000 mL Intravenous Once  . vancomycin  1,000 mg Intravenous Once  . [START ON 10/18/2016] vancomycin  750 mg Intravenous Q12H   Continuous Infusions: PRN Meds: acetaminophen **OR** acetaminophen (TYLENOL) oral liquid 160 mg/5 mL **OR** acetaminophen, haloperidol lactate, HYDROcodone-acetaminophen, sodium chloride flush  Time spent: 45 minutes The patient is critically ill with multiple organ systems failure and requires high complexity decision making for assessment and support, frequent evaluation and titration of therapies. Critical Care Time devoted to patient care services described in this note is 45 minutes   Author: Berle Mull, MD [facility] Pager: [phone] 10/25/2016 4:58 PM  If 7PM-7AM, please contact night-coverage at [email or web address], password [email or web address]

## 2016-10-17 NOTE — Progress Notes (Signed)
STAT EEG completed; results pending. 

## 2016-10-17 NOTE — Procedures (Signed)
INPATIENT EEG REPORT  Description This is a routine video-EEG done at the bedside with standard technique.  The patient was listed as being awake and agitated at times throughout the recording.  No activating procedures were done.  The patient was not listed as being on anti-seizure medication.  Interpretation Background activity shows 6-7 Hz generalized slowing that is symmetric. The background was reduced amplitude and severely disorganized.  There was significant scattered noise from muscle/movement and eye-opening artifact.  Activating procedures were not done.  Of note, there were frontal intermittent rhythmic delta activity (FIRDA) seen.  Impression This is an abnormal EEG due to moderate generalized slowing and FIRDA.  Clinical Correlation These findings suggest a moderately severe encephalopathy, likely metabolic in nature.  There is no evidence for seizure activity at this time.  Dr. Lawana Pai [facility] [phone]  10/04/2016, 3:45 PM

## 2016-10-17 NOTE — ED Notes (Signed)
Dr. Leonel Ramsay spoke with patient's husband via phone. Pt husband on his way to ED now.

## 2016-10-17 NOTE — Progress Notes (Signed)
Tracy Dodson is 66 yo female who presented to ED from [facility] via [facility] for aphasia. LKW 10/16/2016 1600, CBG 124, NIHSS 6. Pt currently taking Eliquis. She is to be admitted to [facility] for further evaluation.

## 2016-10-17 NOTE — ED Notes (Signed)
Patient transported to MRI 

## 2016-10-17 NOTE — Progress Notes (Signed)
*  PRELIMINARY RESULTS* Vascular Ultrasound Carotid Duplex (Doppler) has been completed.  Preliminary findings: limited due to patient constantly moving head. Limited views of Right ICA, but appears to be 1-39% stenosis. Unable to image Left side due to patient pushing me away.  Please reorder for Left carotid if needed and patient able to complete test.     Landry Mellow, RDMS, RVT  10/06/2016, 12:13 PM

## 2016-10-17 NOTE — Telephone Encounter (Signed)
Received email from patient's sister in law, Tracy Dodson, that patient has some questions about starting treatment today.  Called Tracy Dodson and she said that Tracy Dodson was brought to the ER at [facility] last night for confusion.  She may have had a stroke and is currently having an MRI.  They need to cancel radiation today.  Notified Candace, RT on [place] [address] and message routed to [name].

## 2016-10-17 NOTE — ED Notes (Signed)
Report to [facility]
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
## 2016-10-17 NOTE — ED Notes (Signed)
Neurologist at bedside. 

## 2016-10-17 NOTE — Progress Notes (Signed)
Pharmacy Antibiotic Note  CASSIA FEIN is a 66 y.o. female admitted on 10/29/2016 with difficulty speaking, new stroke. Hx lung cancer with brain mets. Was on Septra prior to admission for UTI .   Pharmacy has been consulted for Vancomycin and Zosyn dosing for sepsis coverage.   Tmax 100.6, WBC 38.9. Blood cultures drawn, then antibiotics begun.   Plan:  Zosyn 3.375 gm IV x 1 over 30 minutes given; continue with Zosyn 3/375 gm IV q8hrs (each over 4 hrs)  Vancomycin 1 gram IV x 1 as ordered, then 750 mg IV q12hrs.  Vanc trough goal 15-20 mcg/ml  Follow renal function, culture data, progress.  Height: 5' 4.5" (163.8 cm) Weight: 124 lb 1.9 oz (56.3 kg) IBW/kg (Calculated) : 55.85  Temp (24hrs), Avg:98.7 F (37.1 C), Min:97.4 F (36.3 C), Max:100.6 F (38.1 C)   Recent Labs Lab 10/04/2016 0441 10/06/2016 0448 10/01/2016 0500 10/16/2016 1315 10/25/2016 1317  WBC 38.9*  --   --  PENDING  --   CREATININE 0.76  --  0.70  --   --   LATICACIDVEN  --  1.81  --   --  1.5    Estimated Creatinine Clearance: 63.5 mL/min (by C-G formula based on SCr of 0.7 mg/dL).    Allergies  Allergen Reactions  . Compazine [Prochlorperazine Edisylate] Other (See Comments)    Mental status changes -confusion   . Other Rash    Looked like I had the measles. Red spotted rash. Pt.states from PET scan Per pt ---no allergy to IV contrast media  02/29/16    Antimicrobials this admission:  Zosyn 1/18>>  Vancomycin 1/18>>  Dose adjustments this admission:  n/a  Microbiology results:  1/18 blood x 2 - sent  1/18 urine -  1/18 sputum -  Thank you for allowing pharmacy to be a part of this patient's care.  Arty Baumgartner, [place] Pager: [phone] 09/30/2016 3:29 PM

## 2016-10-18 ENCOUNTER — Inpatient Hospital Stay (HOSPITAL_COMMUNITY): Payer: PRIVATE HEALTH INSURANCE

## 2016-10-18 ENCOUNTER — Telehealth: Payer: Self-pay | Admitting: Medical Oncology

## 2016-10-18 ENCOUNTER — Ambulatory Visit: Payer: PRIVATE HEALTH INSURANCE

## 2016-10-18 ENCOUNTER — Telehealth: Payer: Self-pay | Admitting: Oncology

## 2016-10-18 DIAGNOSIS — Z515 Encounter for palliative care: Secondary | ICD-10-CM

## 2016-10-18 DIAGNOSIS — R0602 Shortness of breath: Secondary | ICD-10-CM

## 2016-10-18 DIAGNOSIS — I639 Cerebral infarction, unspecified: Secondary | ICD-10-CM

## 2016-10-18 LAB — COMPREHENSIVE METABOLIC PANEL
ALK PHOS: 83 U/L (ref 38–126)
ALT: 12 U/L — AB (ref 14–54)
AST: 44 U/L — ABNORMAL HIGH (ref 15–41)
Albumin: 2.7 g/dL — ABNORMAL LOW (ref 3.5–5.0)
Anion gap: 14 (ref 5–15)
BUN: 6 mg/dL (ref 6–20)
CALCIUM: 8.6 mg/dL — AB (ref 8.9–10.3)
CO2: 21 mmol/L — ABNORMAL LOW (ref 22–32)
CREATININE: 0.65 mg/dL (ref 0.44–1.00)
Chloride: 99 mmol/L — ABNORMAL LOW (ref 101–111)
Glucose, Bld: 171 mg/dL — ABNORMAL HIGH (ref 65–99)
Potassium: 2.9 mmol/L — ABNORMAL LOW (ref 3.5–5.1)
Sodium: 134 mmol/L — ABNORMAL LOW (ref 135–145)
Total Bilirubin: 2 mg/dL — ABNORMAL HIGH (ref 0.3–1.2)
Total Protein: 5.8 g/dL — ABNORMAL LOW (ref 6.5–8.1)

## 2016-10-18 LAB — CBC WITH DIFFERENTIAL/PLATELET
BAND NEUTROPHILS: 15 %
BLASTS: 0 %
Basophils Absolute: 0 10*3/uL (ref 0.0–0.1)
Basophils Relative: 0 %
Eosinophils Absolute: 0 10*3/uL (ref 0.0–0.7)
Eosinophils Relative: 0 %
HEMATOCRIT: 30.9 % — AB (ref 36.0–46.0)
HEMOGLOBIN: 10.1 g/dL — AB (ref 12.0–15.0)
Lymphocytes Relative: 6 %
Lymphs Abs: 1.5 10*3/uL (ref 0.7–4.0)
MCH: 27.9 pg (ref 26.0–34.0)
MCHC: 32.7 g/dL (ref 30.0–36.0)
MCV: 85.4 fL (ref 78.0–100.0)
MONOS PCT: 7 %
Metamyelocytes Relative: 6 %
Monocytes Absolute: 1.8 10*3/uL — ABNORMAL HIGH (ref 0.1–1.0)
Myelocytes: 2 %
NEUTROS ABS: 22.5 10*3/uL — AB (ref 1.7–7.7)
Neutrophils Relative %: 64 %
OTHER: 0 %
Platelets: 28 10*3/uL — CL (ref 150–400)
Promyelocytes Absolute: 0 %
RBC: 3.62 MIL/uL — ABNORMAL LOW (ref 3.87–5.11)
RDW: 16.9 % — ABNORMAL HIGH (ref 11.5–15.5)
WBC: 25.8 10*3/uL — AB (ref 4.0–10.5)
nRBC: 1 /100 WBC — ABNORMAL HIGH

## 2016-10-18 LAB — TROPONIN I: TROPONIN I: 1.24 ng/mL — AB (ref <0.03)

## 2016-10-18 LAB — LIPID PANEL
CHOLESTEROL: 144 mg/dL (ref 0–200)
HDL: 31 mg/dL — ABNORMAL LOW (ref >40)
LDL Cholesterol: 95 mg/dL (ref 0–99)
Total CHOL/HDL Ratio: 4.6 RATIO
Triglycerides: 91 mg/dL (ref <150)
VLDL: 18 mg/dL (ref 0–40)

## 2016-10-18 LAB — TSH: TSH: 2.903 u[IU]/mL (ref 0.350–4.500)

## 2016-10-18 LAB — ECHOCARDIOGRAM COMPLETE
HEIGHTINCHES: 64.5 in
Weight: 1985.9 oz

## 2016-10-18 LAB — PROTIME-INR
INR: 2.39
PROTHROMBIN TIME: 26.5 s — AB (ref 11.4–15.2)

## 2016-10-18 LAB — PATHOLOGIST SMEAR REVIEW

## 2016-10-18 LAB — ADAMTS13 ACTIVITY: Adamts 13 Activity: 59.4 % — ABNORMAL LOW (ref >66.8)

## 2016-10-18 LAB — MAGNESIUM: MAGNESIUM: 1.4 mg/dL — AB (ref 1.7–2.4)

## 2016-10-18 LAB — ADAMTS13 ACTIVITY REFLEX

## 2016-10-18 MED ORDER — HALOPERIDOL 0.5 MG PO TABS
0.5000 mg | ORAL_TABLET | ORAL | Status: DC | PRN
  Filled 2016-10-18: qty 1

## 2016-10-18 MED ORDER — MAGNESIUM SULFATE 4 GM/100ML IV SOLN
4.0000 g | Freq: Once | INTRAVENOUS | Status: AC
  Administered 2016-10-18: 4 g via INTRAVENOUS
  Filled 2016-10-18: qty 100

## 2016-10-18 MED ORDER — GLYCOPYRROLATE 1 MG PO TABS: 1.0000 mg | ORAL_TABLET | ORAL | Status: DC | PRN

## 2016-10-18 MED ORDER — SODIUM CHLORIDE 0.9 % IV SOLN
30.0000 meq | Freq: Once | INTRAVENOUS | Status: AC
  Administered 2016-10-18: 30 meq via INTRAVENOUS
  Filled 2016-10-18: qty 15

## 2016-10-18 MED ORDER — MORPHINE SULFATE (PF) 2 MG/ML IV SOLN: 2.0000 mg | INTRAVENOUS | Status: DC | PRN

## 2016-10-18 MED ORDER — GLYCOPYRROLATE 0.2 MG/ML IJ SOLN: 0.2000 mg | INTRAMUSCULAR | Status: DC | PRN

## 2016-10-18 MED ORDER — HALOPERIDOL LACTATE 2 MG/ML PO CONC
0.5000 mg | ORAL | Status: DC | PRN
  Filled 2016-10-18: qty 0.3

## 2016-10-18 MED ORDER — MORPHINE SULFATE (PF) 4 MG/ML IV SOLN
4.0000 mg | INTRAVENOUS | Status: DC | PRN
  Administered 2016-10-18 – 2016-10-19 (×7): 4 mg via INTRAVENOUS
  Filled 2016-10-18 (×8): qty 1

## 2016-10-18 MED ORDER — POTASSIUM CHLORIDE 2 MEQ/ML IV SOLN
30.0000 meq | Freq: Once | INTRAVENOUS | Status: AC
Start: 2016-10-18 — End: 2016-10-18
  Administered 2016-10-18: 30 meq via INTRAVENOUS
  Filled 2016-10-18: qty 15

## 2016-10-18 MED ORDER — PIPERACILLIN-TAZOBACTAM 3.375 G IVPB
3.3750 g | Freq: Three times a day (TID) | INTRAVENOUS | Status: DC
  Administered 2016-10-19: 3.375 g via INTRAVENOUS
  Filled 2016-10-18 (×2): qty 50

## 2016-10-18 MED ORDER — LORAZEPAM 1 MG PO TABS: 1.0000 mg | ORAL_TABLET | ORAL | Status: DC | PRN

## 2016-10-18 MED ORDER — MORPHINE SULFATE (PF) 2 MG/ML IV SOLN
2.0000 mg | INTRAVENOUS | Status: DC | PRN
  Administered 2016-10-18 (×2): 2 mg via INTRAVENOUS
  Filled 2016-10-18 (×2): qty 1

## 2016-10-18 MED ORDER — BIOTENE DRY MOUTH MT LIQD: 15.0000 mL | OROMUCOSAL | Status: DC | PRN

## 2016-10-18 MED ORDER — LORAZEPAM 2 MG/ML IJ SOLN: 1.0000 mg | INTRAMUSCULAR | Status: DC | PRN

## 2016-10-18 MED ORDER — LORAZEPAM 2 MG/ML PO CONC
1.0000 mg | ORAL | Status: DC | PRN
  Administered 2016-10-19: 1 mg via SUBLINGUAL
  Filled 2016-10-18: qty 1

## 2016-10-18 MED ORDER — ONDANSETRON HCL 4 MG/2ML IJ SOLN: 4.0000 mg | Freq: Four times a day (QID) | INTRAMUSCULAR | Status: DC | PRN

## 2016-10-18 MED ORDER — POLYVINYL ALCOHOL 1.4 % OP SOLN
1.0000 [drp] | Freq: Four times a day (QID) | OPHTHALMIC | Status: DC | PRN
  Filled 2016-10-18: qty 15

## 2016-10-18 MED ORDER — FENTANYL 50 MCG/HR TD PT72
50.0000 ug | MEDICATED_PATCH | TRANSDERMAL | Status: DC
  Administered 2016-10-18 – 2016-10-21 (×2): 50 ug via TRANSDERMAL
  Filled 2016-10-18 (×2): qty 1

## 2016-10-18 MED ORDER — ONDANSETRON 4 MG PO TBDP: 4.0000 mg | ORAL_TABLET | Freq: Four times a day (QID) | ORAL | Status: DC | PRN

## 2016-10-18 MED ORDER — HALOPERIDOL LACTATE 5 MG/ML IJ SOLN
5.0000 mg | Freq: Four times a day (QID) | INTRAMUSCULAR | Status: DC | PRN
Start: 2016-10-18 — End: 2016-10-18

## 2016-10-18 MED ORDER — HALOPERIDOL LACTATE 5 MG/ML IJ SOLN: 0.5000 mg | INTRAMUSCULAR | Status: DC | PRN

## 2016-10-18 NOTE — Progress Notes (Signed)
CRITICAL VALUE ALERT  Critical value received:K+=2.9, Trop =1.24, Platelets=28  Date of notification:  05/18/17  Time of notification: 6:14  Critical value read back:  Nurse who received alert: Ayden Hardwick  MD notified (1st page [place]  Time of first page: 0625  MD notified (2nd page):  Time of second page:  Responding MD:  Jolaine Artist Time MD re

## 2016-10-18 NOTE — Progress Notes (Signed)
OT Cancellation Note  Patient Details Name: Tracy Dodson MRN: [id number] DOB: 02-09-1951   Cancelled Treatment:    Reason Eval/Treat Not Completed: Medical issues which prohibited therapy;OT screened, no needs identified, will sign off. Pt not appropriate for skilled OT intervention at this time due to medical status. RN reports family discussing home with hospice. Please re-consult if status improves and OT can be of assistance.   Chrys Racer , MS, OTR/L, CLT Pager: [phone] 10/18/2016, 2:01 PM

## 2016-10-18 NOTE — Progress Notes (Signed)
SLP Cancellation Note  Patient Details Name: ASMA BOLDON MRN: [id number] DOB: Aug 23, 1951   Cancelled treatment:        Unfortunately pt unresponsive, respirations labored, had morphine around 11:30 however pt's sister-in-law stated she was not alert when she arrived. ST signing off at this point.    Houston Siren 10/18/2016, 12:05 PM   Orbie Pyo Colvin Caroli.Ed Engineer, agricultural [phone]   .

## 2016-10-18 NOTE — Progress Notes (Signed)
[facility] Progress Note  Patient: Tracy Dodson OEU:[id number]   PCP: [name], MD DOB: 1951/06/24   DOA: 10/10/2016   DOS: 10/18/2016   Date of Service: the patient was seen and examined on 10/18/2016  Brief hospital course: Pt. with PMH of NSCLC with bone and brain metastasis, on chemotherapy and radiation, HTN, PMR, mood disorder, recent right hip fracture and kyphoplasty T10; admitted on 10/13/2016, with complaint of confusion and difficulty speaking, was found to have acute encephalopathy as well as acute ischemic stroke and suspected sepsis. Currently further plan is Complete comfort care.  Assessment and Plan: 1. Acute ischemic stroke Acute left MCA and PCA territory infarct embolic in nature. Management is difficult given thrombocytopenia. Patient was taking Apixaban for DVT prophylaxis dose despite which she has developed the stroke. Holding anticoagulation while the INR is more than 2. Antiplatelet would not be ideal given thrombocytopenia. Neurology consulted. PTOT and speech therapy consulted, patient on regular consistency diet per speech therapy although appears high risk for aspiration therefore nothing by mouth at present. Currently on comfort feeds Vascular Doppler shows minimal stenosis on the right. Echocardiogram shows 35-40% EF with wall motion abnormality in the anterolateral lateral and inferolateral area.  2.acute encephalopathy. Etiology currently unclear and probably multifactorial. Acute stroke, possible seizures, metabolic encephalopathy with sepsis, polypharmacy, opiate withdrawal. At present EEG unremarkable for any focal seizures. MRI brain does not show any new metastatic lesions. Patient started on empiric IV antibiotics with vancomycin and Zosyn. Blood cultures performed.  3. sepsis. From Klebsiella UTI, present on admission. Patient with low-grade temperature, tachycardia, hypotension, tachypnea as well as acute encephalopathy. Meeting sepsis  criteria. Lactic acid normal. Chest x-ray clear. UA shows evidence of UTI, recently started on oral Bactrim as an outpatient. I will broaden the coverage to vancomycin and Zosyn. Blood cultures ordered.  4. Acute anemia, acute on chronic thrombocytopenia, leukocytosis. Etiology of these findings is currently unclear rate Possibility of sepsis causing bicytopenia cannot be ruled out. Without any schistocytes TTP less likely, with normal fibrinogen HUS DIC less likely. S/P 2 units PRBC. H&H stable Check FOBT. IV PPI.  5.elevated troponin. Sinus tachycardia. Suspected Ischemic cardiomyopathy Patient's troponin elevation as well as tachycardia is likely response to demand ischemia. Unable to use any anticoagulation or aspirin given severe thrombocytopenia as well as anemia. Echogram shows 35-40% EF with wall motion abnormalities concerning for possible ischemic cardio myopathy.  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 Chronic pain syndrome. Currently given acute sepsis on holding patient's chemotherapy regimen. Discussed with patient's primary oncologist Dr. Julien Nordmann Patient was on chronic prednisone I would currently use IV Solu Cortef. Patient was on oral OxyContin 40 mg every 12 hours for pain control, equivalent dose of which would be ideally 50 mics of fentanyl patch   7. Goals of care discussion. On admission patient was full code, I discussed with patient's family regarding their goals of care, family responded the patient has recently mentioned about being DO NOT RESUSCITATE. Husband at bedside agreed with that and CODE STATUS changed to DO NOT RESUSCITATE DO NOT INTUBATE. Patient with multiple comorbidities at present, suspected sepsis, acute encephalopathy. Has severe thrombo-cytopenia and anemia limiting use of antiplatelets as well as anticoagulation medication which is accessory for secondary stroke prevention in a patient with acute  stroke. Patient also has multisystem organ failure likely given elevated troponin levels. At this point patient with high likelihood of not surviving this hospitalization, even if patient survives this  hospitalization long-term prognosis is limited due to presence of non-small cell lung cancer and thrombocytopenia neurology as well as medical oncology both recommended palliative care consult. Appreciate palliative care input. After discussing with family, family has opted for complete comfort care approach for the patient. Patient would be ideally served in inpatient hospice facility. Referral has been sent out.  8. Hypokalemia. NSVT. Patient did have 2 prolonged NSVT is on telemetry. Avoiding Haldol in this setting. Also has elevated troponin as well as cardiomyopathy. Treatment options are limited for cardiomyopathy in the setting of anemia as well as thrombocytopenia. Family has decided to opt for complete comfort care.  Bowel regimen: last BM 10/18/2016 Diet:  Comfort care feeds DVT Prophylaxis: mechanical compression device  Advance goals of care discussion: see above, complete comfort  Family Communication: family was present at bedside, at the time of interview. The pt provided permission to discuss medical plan with the family. Opportunity was given to ask question and all questions were answered satisfactorily.   Disposition:  Discharge to inpatient hospice. Expected discharge date: 10/19/2016 pending facility availability.  Consultants: Neurology, palliative care, phone consultation with medical oncology Procedures: Echocardiogram, carotid Doppler   Antibiotics: Anti-infectives    Start     Dose/Rate Route Frequency Ordered Stop   10/19/16 0100  piperacillin-tazobactam (ZOSYN) IVPB 3.375 g     3.375 g 12.5 mL/hr over 240 Minutes Intravenous Every 8 hours 10/18/16 1732     10/18/16 0400  vancomycin (VANCOCIN) IVPB 750 mg/150 ml premix     750 mg 150 mL/hr over 60  Minutes Intravenous Every 12 hours 10/20/2016 1443     10/13/2016 2200  piperacillin-tazobactam (ZOSYN) IVPB 3.375 g  Status:  Discontinued     3.375 g 12.5 mL/hr over 240 Minutes Intravenous Every 8 hours 10/15/2016 1443 10/18/16 1732   10/10/2016 1300  piperacillin-tazobactam (ZOSYN) IVPB 3.375 g     3.375 g 100 mL/hr over 30 Minutes Intravenous  Once 10/02/2016 1248 10/06/2016 1426   10/12/2016 1300  vancomycin (VANCOCIN) IVPB 1000 mg/200 mL premix     1,000 mg 200 mL/hr over 60 Minutes Intravenous  Once 10/01/2016 1248 10/26/2016 1908   10/23/2016 1000  sulfamethoxazole-trimethoprim (BACTRIM DS,SEPTRA DS) 800-160 MG per tablet 1 tablet  Status:  Discontinued     1 tablet Oral 2 times daily 10/08/2016 0900 10/18/2016 1248      Subjective: Remains Confused, not following any command, and more clear speech   Objective: Physical Exam: Vitals:   10/18/16 0300 10/18/16 0330 10/18/16 0503 10/18/16 1402  BP: 121/79 124/73 127/73 104/66  Pulse: (!) 126 (!) 122 (!) 122 (!) 116  Resp: (!) 24 (!) '30 18 20  '$ Temp: 99.2 F (37.3 C) 99.4 F (37.4 C) 99.3 F (37.4 C) 97.4 F (36.3 C)  TempSrc: Oral Oral Axillary Axillary  SpO2: 96% 96% 98%   Weight:      Height:        Intake/Output Summary (Last 24 hours) at 10/18/16 1802 Last data filed at 10/18/16 1552  Gross per 24 hour  Intake             1640 ml  Output                2 ml  Net             1638 ml   Filed Weights   10/16/2016 0454  Weight: 56.3 kg (124 lb 1.9 oz)    General: Confused, not following any command, and incompressible  speech  Appear in moderate distress, Eyes: PERRL, Conjunctiva normal ENT: Oral Mucosa clear moist. Neck: difficult to assess JVD, no Abnormal Mass Or lumps Cardiovascular: S1 and S2 Present, no Murmur, Respiratory: Bilateral Air entry equal and Decreased, no use of accessory muscle, Clear to Auscultation, no Crackles, no wheezes Abdomen: Bowel Sound present, Soft and no tenderness Skin: no redness, no Rash, no  induration Extremities: trace Pedal edema, no calf tenderness Neurologic: Confused, not following any command, and incompressible speech   moving all extremity is adequately, withdraws to painful stimuli   Data Reviewed: CBC:  Recent Labs Lab 10/14/2016 0441 10/02/2016 0500 10/08/2016 1315 10/18/16 0510  WBC 38.9*  --  23.2* 25.8*  NEUTROABS 31.9*  --  16.1* 22.5*  HGB 7.5* 8.2* 6.1* 10.1*  HCT 24.8* 24.0* 20.0* 30.9*  MCV 86.4  --  86.2 85.4  PLT 43*  --  34* 28*   Basic Metabolic Panel:  Recent Labs Lab 10/03/2016 0441 10/19/2016 0500 10/06/2016 1315 10/18/16 0510  NA 131* 131* 133* 134*  K 3.6 3.6 3.5 2.9*  CL 92* 91* 97* 99*  CO2 21*  --  21* 21*  GLUCOSE 118* 123* 91 171*  BUN '10 12 9 6  '$ CREATININE 0.76 0.70 0.64 0.65  CALCIUM 9.3  --  8.9 8.6*  MG  --   --   --  1.4*    Liver Function Tests:  Recent Labs Lab 10/19/2016 0441 10/04/2016 1315 10/18/16 0510  AST 44* 41 44*  ALT 11* 11* 12*  ALKPHOS 87 82 83  BILITOT 1.3* 1.0 2.0*  PROT 6.6 5.9* 5.8*  ALBUMIN 3.3* 3.0* 2.7*   No results for input(s): LIPASE, AMYLASE in the last 168 hours. No results for input(s): AMMONIA in the last 168 hours. Coagulation Profile:  Recent Labs Lab 10/16/2016 0441 10/12/2016 1315 10/18/16 0653  INR 2.36 2.31 2.39   Cardiac Enzymes:  Recent Labs Lab 10/16/2016 1051 10/27/2016 1315 10/05/2016 1700 10/18/16 0510  TROPONINI 0.50* 0.42* 0.48* 1.24*   BNP (last 3 results) No results for input(s): PROBNP in the last 8760 hours.  CBG:  Recent Labs Lab 10/13/2016 0500  GLUCAP 124*    Studies: No results found.   Scheduled Meds: . sodium chloride   Intravenous Once  . fentaNYL  50 mcg Transdermal Q72H  . hydrocortisone sod succinate (SOLU-CORTEF) inj  50 mg Intravenous Q8H  . [START ON 10/19/2016] piperacillin-tazobactam (ZOSYN)  IV  3.375 g Intravenous Q8H  . potassium chloride (KCL MULTIRUN) 30 mEq in 265 mL IVPB  30 mEq Intravenous Once  . vancomycin  750 mg Intravenous Q12H    Continuous Infusions: PRN Meds: acetaminophen **OR** acetaminophen (TYLENOL) oral liquid 160 mg/5 mL **OR** acetaminophen, antiseptic oral rinse, glycopyrrolate **OR** glycopyrrolate **OR** glycopyrrolate, haloperidol **OR** haloperidol **OR** haloperidol lactate, LORazepam **OR** LORazepam **OR** LORazepam, morphine injection, ondansetron **OR** ondansetron (ZOFRAN) IV, polyvinyl alcohol, sodium chloride flush  Time spent: 45 minutes  Author: Berle Mull, MD [facility] Pager: [phone] 10/18/2016 6:02 PM  If 7PM-7AM, please contact night-coverage at [email or web address], password [email or web address]

## 2016-10-18 NOTE — Care Management Note (Addendum)
Case Management Note  Patient Details  Name: Tracy Dodson MRN: [id number] Date of Birth: 1951/09/16  Subjective/Objective:             Admitted on 10/18/2016, with complaint of confusion and difficulty speaking, was found to have acute encephalopathy as well as acute ischemic stroke and suspected sepsis. Hx of NSCLC with bone and brain metastasis, on chemotherapy and radiation, HTN, PMR, mood disorder, recent right hip fracture and kyphoplasty T10       -   Found to have acute left MCA and PCA territory infarct    Shirell Struthers (Spouse)     [phone]      PCP:  Lona Kettle  Action/Plan:  SNF vs inpatient hospice  Palliative consult pending.... CM to f/u with disposition needs.  Expected Discharge Date:                  Expected Discharge Plan:  Skilled Nursing Facility  In-House Referral:  Clinical Social Work  Discharge planning Services  CM Consult  Post Acute Care Choice:    Choice offered to:     DME Arranged:    DME Agency:     HH Arranged:    [place] Agency:     Status of Service:  In process, will continue to follow  If discussed at Long Length of Stay Meetings, dates discussed:    Additional Comments:  Sharin Mons, RN 10/18/2016, 10:00 AM

## 2016-10-18 NOTE — Progress Notes (Signed)
PT Cancellation Note  Patient Details Name: Tracy Dodson MRN: [id number] DOB: Feb 09, 1951   Cancelled Treatment:    Reason Eval/Treat Not Completed: Medical issues which prohibited therapy;PT screened, no needs identified, will sign off. Pt not appropriate for skilled PT intervention due to medical status. RN reports family discussing home with hospice. Please re-consult if status improves and PT can be of assistance.   Lorriane Shire 10/18/2016, 9:48 AM

## 2016-10-18 NOTE — Progress Notes (Signed)
Nutrition Brief Note  Chart reviewed. Pt now transitioning to comfort care. CSW consulted for residential hospice.  No further nutrition interventions warranted at this time.  Please re-consult as needed.   Shuaib Corsino A. Jimmye Norman, RD, LDN, CDE Pager: [phone] After hours Pager: [phone]

## 2016-10-18 NOTE — Telephone Encounter (Signed)
Lysbeth Galas called asking if pt referred to hospice . I called Lysbeth Galas and told her yes. They do not need any disabilty forms filled out.

## 2016-10-18 NOTE — Progress Notes (Signed)
STROKE TEAM PROGRESS NOTE   HISTORY OF PRESENT ILLNESS (per record) Tracy Dodson is a 66 y.o. female who was noticed to be abnormal during the 10:57 PM shift at [facility], but she was still able to make some sense ("she seemed okay to me" per the nurse that had her tonight).   Around 3:30a, however she called out for help and the nurse did notice that something was wrong at that time. Due to her aphasia code stroke was activated.  Though her history is largely unreliable, she is able to answer appropriately that she was not feeling normal when she woke up at 3:30 AM. This is established both by affirmative and negative responses and multiple ways and she was consistent.  Her LKW is unclear, but at least prior to 11 PM 10/16/16 she was normal. Patient was not administered IV t-PA secondary to being on apixaban. She was admitted for further evaluation and treatment.   SUBJECTIVE (INTERVAL HISTORY) One family member is at bedside. Pt poorly responsive and has respiratory distress with cheyne-stokes breathing pattern. Continued to have thrombocytopenia, coagulopathy, leukocystosis, AMS and lack of responsive. Palliative care involved and on morphine PRN. Plan for residential hospice.    OBJECTIVE Temp:  [99.1 F (37.3 C)-100 F (37.8 C)] 99.3 F (37.4 C) (01/19 0503) Pulse Rate:  [111-126] 122 (01/19 0503) Cardiac Rhythm: Ventricular tachycardia (01/19 0854) Resp:  [18-36] 18 (01/19 0503) BP: (104-162)/(45-111) 127/73 (01/19 0503) SpO2:  [95 %-98 %] 98 % (01/19 0503)  CBC:   Recent Labs Lab 10/22/2016 1315 10/18/16 0510  WBC 23.2* 25.8*  NEUTROABS 16.1* 22.5*  HGB 6.1* 10.1*  HCT 20.0* 30.9*  MCV 86.2 85.4  PLT 34* 28*    Basic Metabolic Panel:   Recent Labs Lab 10/23/2016 1315 10/18/16 0510  NA 133* 134*  K 3.5 2.9*  CL 97* 99*  CO2 21* 21*  GLUCOSE 91 171*  BUN 9 6  CREATININE 0.64 0.65  CALCIUM 8.9 8.6*  MG  --  1.4*    Lipid Panel:     Component Value  Date/Time   CHOL 144 10/18/2016 0510   TRIG 91 10/18/2016 0510   HDL 31 (L) 10/18/2016 0510   CHOLHDL 4.6 10/18/2016 0510   VLDL 18 10/18/2016 0510   LDLCALC 95 10/18/2016 0510   HgbA1c: No results found for: HGBA1C Urine Drug Screen:     Component Value Date/Time   LABOPIA POSITIVE (A) 10/30/2016 0541   COCAINSCRNUR NONE DETECTED 10/11/2016 0541   LABBENZ NONE DETECTED 10/03/2016 0541   AMPHETMU NONE DETECTED 10/02/2016 0541   THCU NONE DETECTED 10/30/2016 0541   LABBARB NONE DETECTED 10/20/2016 0541      IMAGING I have personally reviewed the radiological images below and agree with the radiology interpretations.  Ct Head Code Stroke W/o Cm 10/08/2016 1. Motion degraded study, suboptimal assessment for subtle infarct or hemorrhage. 2. Wedge-shaped focus of hypoattenuation within the left temporal occipital junction with loss of gray-white differentiation probably representing an area of acute infarction. 3. ASPECTS is 10   Mr Brain Wo Contrast 10/09/2016 1. Patchy moderate sized acute infarcts in the left MCA and PCA distributions. Patient has a large left posterior communicating artery by previous imaging. 2. Treated cerebral metastatic disease with right occipital cavity. 3. Limited exam due to patient condition. Only motion degraded diffusion and FLAIR imaging could be obtained.   EEG - This is an abnormal EEG due to moderate generalized slowing and FIRDA. Clinical Correlation These findings suggest a  moderately severe encephalopathy, likely metabolic in nature.  There is no evidence for seizure activity at this time.  CUS - limited due to patient constantly moving head. Limited views of Right ICA, but appears to be 1-39% stenosis. Unable to image Left side due to patient pushing me away.  TTE - cancelled due to palliative care   PHYSICAL EXAM  Temp:  [99.1 F (37.3 C)-100 F (37.8 C)] 99.3 F (37.4 C) (01/19 0503) Pulse Rate:  [111-126] 122 (01/19 0503) Resp:   [18-36] 18 (01/19 0503) BP: (104-162)/(45-111) 127/73 (01/19 0503) SpO2:  [95 %-98 %] 98 % (01/19 0503)  General - cachectic female, well developed, poorly responsive.  Ophthalmologic - Fundi not visualized due to noncooperation.  Cardiovascular - Regular rate and rhythm.  Neuro - exam very limited due to comfort care measures. Poorly responsive, eyes closed, cheyne-stokes respiratory pattern, not following commands, PERRL, right mild nasolabial fold flattening, no spontaneous movement during rounds. Sensation, coordination and gait not tested.    ASSESSMENT/PLAN Ms. Tracy Dodson is a 66 y.o. female with history of lung Ca metastatic to brain and right hip, pancytopenia, recent RIGHT hip fracture and kyphoplasty with T10 fracture presenting with aphasia, restless, and AMS. She did not receive IV t-PA due to being on apixaban.   Stroke:  Patchy left MCA, MCA/PCA infarcts, embolic pattern likely related to hypercoagulable state secondary to advance lung cancer  Resultant  Aphasia and encephalopathy, poorly responsive  MRI  Patchy L MCA and MCA/PCA infarcts. Treated R occipital metastatic disease.  MRA  Not ordered, as it would not change management  Carotid Doppler  Right ICA patent, left ICA can not see due to not cooperative  2D Echo  Cancelled due to palliative care   LDL 95  HgbA1c pending  SCDs for VTE prophylaxis Diet NPO time specified Except for: Ice Chips, Sips with Meds  Eliquis (apixaban) daily prior to admission, now on no antithrombotics due to thrombocytopenia and elevated INR.   Agree with comfort care measures  Acute encephalopathy  EEG showed diffuse slow with FIRDA - no seizure  myoclonous vs. Asterixis  Could be related to severe anemia, thrombocytopenia, leukocytosis  UTI with Possible sepsis  Elevated troponin   Agree with comfort care measures  Anemia, thrombocytopenia, leukocytosis  Hb 7.5->6.1 - blood transfusion -> 10.1  Plat  43->34->28 - no ASA at this time  WBC 38.9->23.2->25.8 - UTI and possible sepsis on Abx with zosyn and vanco vs. Steroids PTA for PMR  INR 2.36->2.31->2.39 - no anticoagulation this time  ? DIC - fibrinogen WNL  Peripheral smear - no schistocytes  Lung cancer with metastasis to bone, brain  palliative care on board  Currently on comfort care measures  Was on eliquis for DVT prophylaxis due to hip fracture  No AC at this time due to elevated INR and anemia  Hypertension  Stable  Permissive hypertension (OK if < 180/105) but gradually normalize in 5-7 days  Long-term BP goal normotensive  Hyperlipidemia  Home meds:  No statin  LDL 95, goal < 70  Other Stroke Risk Factors  ETOH use  Other Active Problems  kyphoplasty 1/18  Elevated TSH  Chronic tachycardia  Hx PMR - on steroid PTA  Hospital day # 1  Agree with comfort care measure. Pt has terminal disease. Neurology will sign off. Please call with questions. Thanks for the consult.  Rosalin Hawking, MD PhD Stroke Neurology 10/18/2016 2:43 PM   To contact Stroke Continuity provider, please refer  to [email or web address] After hours, contact General Neurology

## 2016-10-18 NOTE — Telephone Encounter (Signed)
Disability form needs to be completed. I sent note to Stroke team ,Chyrl Civatte for direction. I left a message for Remo Lipps that the from should be completed by stroke team.
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
## 2016-10-18 NOTE — Clinical Social Work Note (Addendum)
Clinical Social Work Assessment  Patient Details  Name: Tracy Dodson MRN: [id number] Date of Birth: 1951/07/01  Date of referral:  10/18/16               Reason for consult:  Facility Placement, End of Life/Hospice                Permission sought to share information with:  Facility Sport and exercise psychologist, Family Supports Permission granted to share information::  Yes, Verbal Permission Granted  Name::     Tracy Dodson  Agency::  Hospice  Relationship::  Spouse  Contact Information:  [phone]  Housing/Transportation Living arrangements for the past 2 months:  [place], [place] of Information:  Adult Children, Spouse Patient Interpreter Needed:  None Criminal Activity/Legal Involvement Pertinent to Current Situation/Hospitalization:  No - Comment as needed Significant Relationships:  Spouse, Adult Children Lives with:  Spouse Do you feel safe going back to the place where you live?  No Need for family participation in patient care:  Yes (Comment)  Care giving concerns:  CSW received referral regarding Hospice placement. CSW spoke with patient's daughter (patient's spouse at work). They would like to pursue residential Hospice placement as near to the family as possible. CSW to continue to follow for discharge needs.    Social Worker assessment / plan:  Patient to discharge to residential hospice.   Employment status:  Retired Forensic scientist:  [employer] PT Recommendations:  Not assessed at this time Information / Referral to community resources:     Patient/Family's Response to care:  Patient's daughter reports being stressed between this and her job and just wants her mom to be comfortable. CSW explained that patient would have to discharge to the first bed that was available.    Patient/Family's Understanding of and Emotional Response to Diagnosis, Current Treatment, and Prognosis:  Patient's daughter expressed understanding of CSW  role and discharge process. No questions/concerns about plan or treatment.    Emotional Assessment Appearance:  Appears older than stated age Attitude/Demeanor/Rapport:  Unable to Assess Affect (typically observed):  Unable to Assess Orientation:   (Disoriented x4) Alcohol / Substance use:  Not Applicable Psych involvement (Current and /or in the community):  No (Comment)  Discharge Needs  Concerns to be addressed:  Care Coordination Readmission within the last 30 days:  Yes Current discharge risk:  None Barriers to Discharge:  Continued Medical Work up   [employer], [place] 10/18/2016, 3:44 PM

## 2016-10-18 NOTE — Progress Notes (Signed)
Referral sent to residential hospice facilities. No beds available.  Tracy Dodson Tracy Dodson LCSWA [phone]

## 2016-10-18 NOTE — Telephone Encounter (Signed)
Aneta Mins who said she had talked to Abelina Bachelor, RN with [name] office yesterday.  She said Dr. Julien Nordmann has been notified about Larah's condition.

## 2016-10-18 NOTE — Progress Notes (Signed)
Addendum to previous consult note:   I met with patient's children and family.   Patient appears more clear today, but continues to be incoherent. She is in and out of responsiveness. Morphine has helped with her agitation.  Family has decided on comfort care and request referral for residential hospice.   Mariana Kaufman, AGNP-C Palliative Medicine  Please call Palliative Medicine team phone with any questions [phone]. For individual providers please see AMION.

## 2016-10-19 LAB — TYPE AND SCREEN
ABO/RH(D): O POS
Antibody Screen: NEGATIVE
UNIT DIVISION: 0
UNIT DIVISION: 0

## 2016-10-19 LAB — URINE CULTURE: Culture: >=100000 — AB

## 2016-10-19 LAB — HEMOGLOBIN A1C
HEMOGLOBIN A1C: 5 % (ref 4.8–5.6)
MEAN PLASMA GLUCOSE: 97 mg/dL

## 2016-10-19 MED ORDER — MORPHINE BOLUS VIA INFUSION
2.0000 mg | INTRAVENOUS | Status: DC | PRN
  Administered 2016-10-19 – 2016-10-21 (×9): 2 mg via INTRAVENOUS
  Filled 2016-10-19: qty 2

## 2016-10-19 MED ORDER — HYDROCORTISONE NA SUCCINATE PF 100 MG IJ SOLR
50.0000 mg | Freq: Every day | INTRAMUSCULAR | Status: DC
  Administered 2016-10-20 – 2016-10-21 (×2): 50 mg via INTRAVENOUS
  Filled 2016-10-19 (×2): qty 2

## 2016-10-19 MED ORDER — SODIUM CHLORIDE 0.9 % IV SOLN
5.0000 mg/h | INTRAVENOUS | Status: DC
  Administered 2016-10-19: 2 mg/h via INTRAVENOUS
  Administered 2016-10-21: 5 mg/h via INTRAVENOUS
  Filled 2016-10-19 (×3): qty 10

## 2016-10-19 NOTE — Progress Notes (Signed)
[facility] Progress Note  Patient: Tracy Dodson ZHG:[id number]   PCP: [name], MD DOB: 12/04/1950   DOA: 10/08/2016   DOS: 10/19/2016   Date of Service: the patient was seen and examined on 10/19/2016  Brief hospital course: Pt. with PMH of NSCLC with bone and brain metastasis, on chemotherapy and radiation, HTN, PMR, mood disorder, recent right hip fracture and kyphoplasty T10; admitted on 10/03/2016, with complaint of confusion and difficulty speaking, was found to have acute encephalopathy as well as acute ischemic stroke and suspected sepsis. Currently further plan is Complete comfort care.  Assessment and Plan: 1. Acute ischemic stroke Acute left MCA and PCA territory infarct embolic in nature. Management is difficult given thrombocytopenia. Patient was taking Apixaban for DVT prophylaxis dose despite which she has developed the stroke. Holding anticoagulation while the INR is more than 2. Antiplatelet would not be ideal given thrombocytopenia. Neurology consulted. PTOT and speech therapy consulted, patient on regular consistency diet per speech therapy although appears high risk for aspiration therefore nothing by mouth at present. Currently on comfort feeds Vascular Doppler shows minimal stenosis on the right. Echocardiogram shows 35-40% EF with wall motion abnormality in the anterolateral lateral and inferolateral area.  2.acute encephalopathy. Etiology currently unclear and probably multifactorial. Acute stroke, possible seizures, metabolic encephalopathy with sepsis, polypharmacy, opiate withdrawal. At present EEG unremarkable for any focal seizures. MRI brain does not show any new metastatic lesions. Patient started on empiric IV antibiotics with vancomycin and Zosyn. Blood cultures performed.  3. sepsis. From Klebsiella UTI, present on admission. Patient with low-grade temperature, tachycardia, hypotension, tachypnea as well as acute encephalopathy. Meeting sepsis  criteria. Lactic acid normal. Chest x-ray clear. UA shows evidence of UTI, recently started on oral Bactrim as an outpatient. Coverage was broaden to vancomycin and Zosyn. Blood cultures negative.  4. Acute anemia, acute on chronic thrombocytopenia, leukocytosis. Etiology of these findings is currently unclear rate Possibility of sepsis causing bicytopenia cannot be ruled out. Without any schistocytes TTP less likely, with normal fibrinogen HUS DIC less likely. S/P 2 units PRBC. H&H stable IV PPI.  5.elevated troponin. Sinus tachycardia. Suspected Ischemic cardiomyopathy Patient's troponin elevation as well as tachycardia is likely response to demand ischemia. Unable to use any anticoagulation or aspirin given severe thrombocytopenia as well as anemia. Echogram shows 35-40% EF with wall motion abnormalities concerning for possible ischemic cardio myopathy.  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 Chronic pain syndrome. Currently given acute sepsis on holding patient's chemotherapy regimen. Discussed with patient's primary oncologist Dr. Julien Nordmann Patient was on chronic prednisone I would currently use IV Solu Cortef. Patient was on oral OxyContin 40 mg every 12 hours for pain control, equivalent dose of which would be ideally 50 mics of fentanyl patch   7. Goals of care discussion. On admission patient was full code, I discussed with patient's family regarding their goals of care, family responded the patient has recently mentioned about being DO NOT RESUSCITATE. Husband at bedside agreed with that and CODE STATUS changed to DO NOT RESUSCITATE DO NOT INTUBATE. Patient with multiple comorbidities at present, suspected sepsis, acute encephalopathy. Has severe thrombo-cytopenia and anemia limiting use of antiplatelets as well as anticoagulation medication which is accessory for secondary stroke prevention in a patient with acute stroke. Patient  also has multisystem organ failure likely given elevated troponin levels. At this point patient with high likelihood of not surviving this hospitalization, even if patient survives this hospitalization long-term prognosis is  limited due to presence of non-small cell lung cancer and thrombocytopenia neurology as well as medical oncology both recommended palliative care consult. Appreciate palliative care input. After discussing with family, family has opted for complete comfort care approach for the patient. Patient would be ideally served in inpatient hospice facility. Referral has been sent out. Patient's breathing appears significantly labored, given her long-term opioid use she is requiring higher dose of medication to control her symptoms. I would start her on IV morphine 3 mg per hour infusion and monitor. This was discussed with Dr. Domingo Cocking from palliative care.  8. Hypokalemia. NSVT. Patient did have 2 prolonged NSVT is on telemetry. Avoiding Haldol in this setting. Also has elevated troponin as well as cardiomyopathy. Treatment options are limited for cardiomyopathy in the setting of anemia as well as thrombocytopenia. Family has decided to opt for complete comfort care.  Bowel regimen: last BM 10/18/2016 Diet:  Comfort care feeds DVT Prophylaxis: mechanical compression device  Advance goals of care discussion: see above, complete comfort  Family Communication: family was present at bedside, at the time of interview. The pt provided permission to discuss medical plan with the family. Opportunity was given to ask question and all questions were answered satisfactorily.   Disposition:  Discharge to inpatient hospice. Expected discharge date: October 27, 2016 pending facility availability versus possible in-hospital death.  Consultants: Neurology, palliative care, phone consultation with medical oncology Procedures: Echocardiogram, carotid Doppler   Antibiotics: Anti-infectives     Start     Dose/Rate Route Frequency Ordered Stop   10/19/16 0100  piperacillin-tazobactam (ZOSYN) IVPB 3.375 g  Status:  Discontinued     3.375 g 12.5 mL/hr over 240 Minutes Intravenous Every 8 hours 10/18/16 1732 10/19/16 0914   10/18/16 0400  vancomycin (VANCOCIN) IVPB 750 mg/150 ml premix  Status:  Discontinued     750 mg 150 mL/hr over 60 Minutes Intravenous Every 12 hours 10/16/2016 1443 10/19/16 0914   10/11/2016 2200  piperacillin-tazobactam (ZOSYN) IVPB 3.375 g  Status:  Discontinued     3.375 g 12.5 mL/hr over 240 Minutes Intravenous Every 8 hours 10/27/2016 1443 10/18/16 1732   10/19/2016 1300  piperacillin-tazobactam (ZOSYN) IVPB 3.375 g     3.375 g 100 mL/hr over 30 Minutes Intravenous  Once 10/19/2016 1248 10/28/2016 1426   10/18/2016 1300  vancomycin (VANCOCIN) IVPB 1000 mg/200 mL premix     1,000 mg 200 mL/hr over 60 Minutes Intravenous  Once 10/16/2016 1248 10/20/2016 1908   10/10/2016 1000  sulfamethoxazole-trimethoprim (BACTRIM DS,SEPTRA DS) 800-160 MG per tablet 1 tablet  Status:  Discontinued     1 tablet Oral 2 times daily 10/01/2016 0900 10/29/2016 1248      Subjective: Remains Lethargic, significantly tachypneic and gasping for air. Objective: Physical Exam: Vitals:   10/18/16 0330 10/18/16 0503 10/18/16 1402 10/18/16 2015  BP: 124/73 127/73 104/66 115/88  Pulse: (!) 122 (!) 122 (!) 116 (!) 120  Resp: (!) '30 18 20 20  '$ Temp: 99.4 F (37.4 C) 99.3 F (37.4 C) 97.4 F (36.3 C) 98.3 F (36.8 C)  TempSrc: Oral Axillary Axillary Axillary  SpO2: 96% 98%  93%  Weight:      Height:        Intake/Output Summary (Last 24 hours) at 10/19/16 1810 Last data filed at 10/19/16 1500  Gross per 24 hour  Intake             8.78 ml  Output  0 ml  Net             8.78 ml   Filed Weights   10/28/2016 0454  Weight: 56.3 kg (124 lb 1.9 oz)    General: Confused, not following any command, and incompressible speech  Appear in Severe distress, Cardiovascular: S1 and S2 Present, no  Murmur, Respiratory: Bilateral Air entry equal and Decreased, no use of accessory muscle, Clear to Auscultation, no Crackles, no wheezes Abdomen: Bowel Sound present, Skin: no redness, no Rash, no induration Extremities: trace Pedal edema, Neurologic: Confused, not following any command  Data Reviewed: CBC:  Recent Labs Lab 10/13/2016 0441 10/04/2016 0500 10/29/2016 1315 10/18/16 0510  WBC 38.9*  --  23.2* 25.8*  NEUTROABS 31.9*  --  16.1* 22.5*  HGB 7.5* 8.2* 6.1* 10.1*  HCT 24.8* 24.0* 20.0* 30.9*  MCV 86.4  --  86.2 85.4  PLT 43*  --  34* 28*   Basic Metabolic Panel:  Recent Labs Lab 10/19/2016 0441 10/16/2016 0500 10/22/2016 1315 10/18/16 0510  NA 131* 131* 133* 134*  K 3.6 3.6 3.5 2.9*  CL 92* 91* 97* 99*  CO2 21*  --  21* 21*  GLUCOSE 118* 123* 91 171*  BUN '10 12 9 6  '$ CREATININE 0.76 0.70 0.64 0.65  CALCIUM 9.3  --  8.9 8.6*  MG  --   --   --  1.4*    Liver Function Tests:  Recent Labs Lab 09/30/2016 0441 10/02/2016 1315 10/18/16 0510  AST 44* 41 44*  ALT 11* 11* 12*  ALKPHOS 87 82 83  BILITOT 1.3* 1.0 2.0*  PROT 6.6 5.9* 5.8*  ALBUMIN 3.3* 3.0* 2.7*   No results for input(s): LIPASE, AMYLASE in the last 168 hours. No results for input(s): AMMONIA in the last 168 hours. Coagulation Profile:  Recent Labs Lab 10/01/2016 0441 10/18/2016 1315 10/18/16 0653  INR 2.36 2.31 2.39   Cardiac Enzymes:  Recent Labs Lab 10/01/2016 1051 10/05/2016 1315 10/26/2016 1700 10/18/16 0510  TROPONINI 0.50* 0.42* 0.48* 1.24*   BNP (last 3 results) No results for input(s): PROBNP in the last 8760 hours.  CBG:  Recent Labs Lab 10/09/2016 0500  GLUCAP 124*    Studies: No results found.   Scheduled Meds: . sodium chloride   Intravenous Once  . fentaNYL  50 mcg Transdermal Q72H  . [START ON 10/20/2016] hydrocortisone sod succinate (SOLU-CORTEF) inj  50 mg Intravenous Daily   Continuous Infusions: . morphine 3 mg/hr (10/19/16 1441)   PRN Meds: acetaminophen **OR**  acetaminophen (TYLENOL) oral liquid 160 mg/5 mL **OR** acetaminophen, antiseptic oral rinse, glycopyrrolate **OR** glycopyrrolate **OR** glycopyrrolate, haloperidol **OR** haloperidol **OR** haloperidol lactate, LORazepam **OR** LORazepam **OR** LORazepam, morphine injection, morphine, ondansetron **OR** ondansetron (ZOFRAN) IV, polyvinyl alcohol, sodium chloride flush  Time spent: 30 minutes  Author: Berle Mull, MD [facility] Pager: [phone] 10/19/2016 6:10 PM  If 7PM-7AM, please contact night-coverage at [email or web address], password [employer]

## 2016-10-20 MED ORDER — KETOROLAC TROMETHAMINE 15 MG/ML IJ SOLN
15.0000 mg | Freq: Four times a day (QID) | INTRAMUSCULAR | Status: DC | PRN
  Administered 2016-10-20: 15 mg via INTRAVENOUS
  Filled 2016-10-20: qty 1

## 2016-10-20 MED ORDER — KETOROLAC TROMETHAMINE 30 MG/ML IJ SOLN
30.0000 mg | Freq: Four times a day (QID) | INTRAMUSCULAR | Status: DC | PRN
  Administered 2016-10-20: 30 mg via INTRAVENOUS
  Filled 2016-10-20: qty 1

## 2016-10-20 MED ORDER — KETOROLAC TROMETHAMINE 30 MG/ML IJ SOLN
30.0000 mg | Freq: Once | INTRAMUSCULAR | Status: AC
Start: 2016-10-20 — End: 2016-10-20
  Administered 2016-10-20: 30 mg via INTRAVENOUS
  Filled 2016-10-20: qty 1

## 2016-10-20 MED ORDER — SODIUM CHLORIDE 0.9 % IV BOLUS (SEPSIS): 500.0000 mL | Freq: Once | INTRAVENOUS | Status: DC

## 2016-10-20 NOTE — Progress Notes (Signed)
Pt temp elevated, family member does not agree to turn pt and administer tylenol suppository. When attempted to move pt moans. SpO2 66%, pt placed on O2 3L via nasal canula. SpO2 88%. Doctor Olevia Bowens notified, RN instructed to keep pt on O2 and order for Toradol IV placed.  Will continue to monitor pt.

## 2016-10-20 NOTE — Progress Notes (Signed)
[facility] Progress Note  Patient: Tracy Dodson UXN:[id number]   PCP: [name], MD DOB: 1951/09/11   DOA: 10/27/2016   DOS: 10/20/2016   Date of Service: the patient was seen and examined on 10/20/2016  Brief hospital course: Pt. with PMH of NSCLC with bone and brain metastasis, on chemotherapy and radiation, HTN, PMR, mood disorder, recent right hip fracture and kyphoplasty T10; admitted on 10/03/2016, with complaint of confusion and difficulty speaking, was found to have acute encephalopathy as well as acute ischemic stroke and suspected sepsis. Currently further plan is Complete comfort care.  Assessment and Plan: 1. Acute ischemic stroke Acute left MCA and PCA territory infarct embolic in nature. Management is difficult given thrombocytopenia. Patient was taking Apixaban for DVT prophylaxis dose despite which she has developed the stroke. Holding anticoagulation while the INR is more than 2. Antiplatelet would not be ideal given thrombocytopenia. Neurology consulted. PTOT and speech therapy consulted, patient on regular consistency diet per speech therapy although appears high risk for aspiration therefore nothing by mouth at present. Currently on comfort feeds Vascular Doppler shows minimal stenosis on the right. Echocardiogram shows 35-40% EF with wall motion abnormality in the anterolateral lateral and inferolateral area.  2. Acute encephalopathy. Etiology currently unclear and probably multifactorial. Acute stroke, possible seizures, metabolic encephalopathy with sepsis, polypharmacy, opiate withdrawal. At present EEG unremarkable for any focal seizures. MRI brain does not show any new metastatic lesions. Patient started on empiric IV antibiotics with vancomycin and Zosyn. Blood cultures performed.  3. sepsis. From Klebsiella UTI, present on admission. Patient with low-grade temperature, tachycardia, hypotension, tachypnea as well as acute encephalopathy. Meeting sepsis  criteria. Lactic acid normal. Chest x-ray clear. UA shows evidence of UTI, recently started on oral Bactrim as an outpatient. Coverage was broaden to vancomycin and Zosyn. Blood cultures negative.  4. Acute anemia, acute on chronic thrombocytopenia, leukocytosis. Etiology of these findings is currently unclear. Possibility of sepsis causing bicytopenia. Without any schistocytes TTP less likely, with normal fibrinogen HUS DIC less likely. Marginally low ADAMTS 13. S/P 2 units PRBC. H&H stable  5.elevated troponin. Sinus tachycardia. Suspected Ischemic cardiomyopathy Patient's troponin elevation as well as tachycardia is likely response to demand ischemia. Unable to use any anticoagulation or aspirin given severe thrombocytopenia as well as anemia. Echogram shows 35-40% EF with wall motion abnormalities concerning for possible ischemic cardiomyopathy.  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 Chronic pain syndrome. Currently given acute sepsis on holding patient's chemotherapy regimen. Discussed with patient's primary oncologist Dr. Julien Nordmann Patient was on chronic prednisone I would currently use IV Solu Cortef. Patient was on oral OxyContin 40 mg every 12 hours for pain control, equivalent dose of which would be ideally 50 mics of fentanyl patch   7. Goals of care discussion. On admission patient was full code, I discussed with patient's family regarding their goals of care, family responded the patient has recently mentioned about being DO NOT RESUSCITATE. Husband at bedside agreed with that and CODE STATUS changed to DO NOT RESUSCITATE DO NOT INTUBATE. Patient with multiple comorbidities at present, suspected sepsis, acute encephalopathy. Has severe thrombo-cytopenia and anemia limiting use of antiplatelets as well as anticoagulation medication which is accessory for secondary stroke prevention in a patient with acute stroke. Patient also has  multisystem organ failure likely given elevated troponin levels. At this point patient with high likelihood of not surviving this hospitalization, even if patient survives this hospitalization long-term prognosis is limited due to  presence of non-small cell lung cancer and thrombocytopenia neurology as well as medical oncology both recommended palliative care consult. Appreciate palliative care input. After discussing with family, family has opted for complete comfort care approach for the patient. Patient would be ideally served in inpatient hospice facility, but currently unsafe to be transferred. Referral has been sent out. Patient's breathing appears significantly labored, given her long-term opioid use she is requiring higher dose of medication to control her symptoms. I would increase on IV morphine 4 mg per hour infusion and monitor. This was discussed with Dr. Domingo Cocking from palliative care.  8. Hypokalemia. NSVT. Patient did have 2 prolonged NSVT is on telemetry. Avoiding Haldol in this setting. Also has elevated troponin as well as cardiomyopathy. Treatment options are limited for cardiomyopathy in the setting of anemia as well as thrombocytopenia. Family has decided to opt for complete comfort care.  Bowel regimen: last BM 10/18/2016 Diet:  Comfort care feeds  Advance goals of care discussion: see above, complete comfort  Family Communication: family was present at bedside, at the time of interview. The pt provided permission to discuss medical plan with the family. Opportunity was given to ask question and all questions were answered satisfactorily.   Disposition:  Discharge to inpatient hospice, but currently unsafe to be transferred. Expected discharge date: Oct 27, 2016 pending facility availability versus possible in-hospital death.  Consultants: Neurology, palliative care, phone consultation with medical oncology Procedures: Echocardiogram, carotid Doppler    Antibiotics: Anti-infectives    Start     Dose/Rate Route Frequency Ordered Stop   10/19/16 0100  piperacillin-tazobactam (ZOSYN) IVPB 3.375 g  Status:  Discontinued     3.375 g 12.5 mL/hr over 240 Minutes Intravenous Every 8 hours 10/18/16 1732 10/19/16 0914   10/18/16 0400  vancomycin (VANCOCIN) IVPB 750 mg/150 ml premix  Status:  Discontinued     750 mg 150 mL/hr over 60 Minutes Intravenous Every 12 hours 10/16/2016 1443 10/19/16 0914   10/04/2016 2200  piperacillin-tazobactam (ZOSYN) IVPB 3.375 g  Status:  Discontinued     3.375 g 12.5 mL/hr over 240 Minutes Intravenous Every 8 hours 10/02/2016 1443 10/18/16 1732   10/11/2016 1300  piperacillin-tazobactam (ZOSYN) IVPB 3.375 g     3.375 g 100 mL/hr over 30 Minutes Intravenous  Once 10/19/2016 1248 10/07/2016 1426   10/16/2016 1300  vancomycin (VANCOCIN) IVPB 1000 mg/200 mL premix     1,000 mg 200 mL/hr over 60 Minutes Intravenous  Once 10/11/2016 1248 10/09/2016 1908   09/30/2016 1000  sulfamethoxazole-trimethoprim (BACTRIM DS,SEPTRA DS) 800-160 MG per tablet 1 tablet  Status:  Discontinued     1 tablet Oral 2 times daily 10/28/2016 0900 09/30/2016 1248      Subjective: Remains Lethargic, less tachypneic and gasping for air. Objective: Physical Exam: Vitals:   10/20/16 0129 10/20/16 0210 10/20/16 0918 10/20/16 1051  BP:      Pulse:      Resp:      Temp: (!) 100.5 F (38.1 C) 99.4 F (37.4 C) (!) 101.6 F (38.7 C) (!) 101.4 F (38.6 C)  TempSrc: Axillary Axillary Axillary Axillary  SpO2:      Weight:      Height:        Intake/Output Summary (Last 24 hours) at 10/20/16 1417 Last data filed at 10/20/16 1107  Gross per 24 hour  Intake            73.31 ml  Output  925 ml  Net          -851.69 ml   Filed Weights   10/01/2016 0454  Weight: 56.3 kg (124 lb 1.9 oz)    General: Confused, not following any command, non conversational, Appear in mild distress, Cardiovascular: S1 and S2 Present, no Murmur, Respiratory: Bilateral  Air entry equal and Decreased, no use of accessory muscle, Clear to Auscultation, no Crackles, no wheezes Abdomen: Bowel Sound present, Skin: no redness, no Rash, no induration Extremities: trace Pedal edema, Neurologic: Confused, not following any command  Data Reviewed: CBC:  Recent Labs Lab 10/16/2016 0441 10/28/2016 0500 10/01/2016 1315 10/18/16 0510  WBC 38.9*  --  23.2* 25.8*  NEUTROABS 31.9*  --  16.1* 22.5*  HGB 7.5* 8.2* 6.1* 10.1*  HCT 24.8* 24.0* 20.0* 30.9*  MCV 86.4  --  86.2 85.4  PLT 43*  --  34* 28*   Basic Metabolic Panel:  Recent Labs Lab 10/08/2016 0441 10/22/2016 0500 10/20/2016 1315 10/18/16 0510  NA 131* 131* 133* 134*  K 3.6 3.6 3.5 2.9*  CL 92* 91* 97* 99*  CO2 21*  --  21* 21*  GLUCOSE 118* 123* 91 171*  BUN '10 12 9 6  '$ CREATININE 0.76 0.70 0.64 0.65  CALCIUM 9.3  --  8.9 8.6*  MG  --   --   --  1.4*    Liver Function Tests:  Recent Labs Lab 10/11/2016 0441 10/08/2016 1315 10/18/16 0510  AST 44* 41 44*  ALT 11* 11* 12*  ALKPHOS 87 82 83  BILITOT 1.3* 1.0 2.0*  PROT 6.6 5.9* 5.8*  ALBUMIN 3.3* 3.0* 2.7*   No results for input(s): LIPASE, AMYLASE in the last 168 hours. No results for input(s): AMMONIA in the last 168 hours. Coagulation Profile:  Recent Labs Lab 10/12/2016 0441 10/12/2016 1315 10/18/16 0653  INR 2.36 2.31 2.39   Cardiac Enzymes:  Recent Labs Lab 10/30/2016 1051 10/16/2016 1315 10/13/2016 1700 10/18/16 0510  TROPONINI 0.50* 0.42* 0.48* 1.24*   BNP (last 3 results) No results for input(s): PROBNP in the last 8760 hours.  CBG:  Recent Labs Lab 10/24/2016 0500  GLUCAP 124*    Studies: No results found.   Scheduled Meds: . sodium chloride   Intravenous Once  . fentaNYL  50 mcg Transdermal Q72H  . hydrocortisone sod succinate (SOLU-CORTEF) inj  50 mg Intravenous Daily   Continuous Infusions: . morphine 4 mg/hr (10/20/16 0818)   PRN Meds: acetaminophen **OR** acetaminophen (TYLENOL) oral liquid 160 mg/5 mL **OR**  acetaminophen, antiseptic oral rinse, glycopyrrolate **OR** glycopyrrolate **OR** glycopyrrolate, haloperidol **OR** haloperidol **OR** haloperidol lactate, ketorolac, LORazepam **OR** LORazepam **OR** LORazepam, morphine injection, morphine, ondansetron **OR** ondansetron (ZOFRAN) IV, polyvinyl alcohol, sodium chloride flush  Time spent: 30 minutes  Author: Berle Mull, MD [facility] Pager: [phone] 10/20/2016 2:17 PM  If 7PM-7AM, please contact night-coverage at [email or web address], password [facility]

## 2016-10-20 NOTE — Progress Notes (Signed)
Pt temperature 101.5, Toradol not due until 0945pm, family refused Tylenol suppository. Schorr, NP notified.

## 2016-10-20 NOTE — Progress Notes (Signed)
Palliative Medicine Progress Note  Reason for visit: Goals of care in light of metastatic cancer, pain management, non-pain symptom management, terminal care  Discussed Ms. Suski's clinical decline with Dr. Posey Pronto earlier today. I agree with continuous infusion morphine in order to ensure her comfort.  I stopped by to check on her for symptom management this evening. She had multiple people visiting, including her husband and daughter.  They report that there is an acute change in her this morning and she has been sleeping throughout the day today. She is no longer waking up in all.  I discussed continued progression in the dying process as well as anticipatory guidance on what to expect moving forward.  I discussed with family regarding prognosis moving forward. I believe the changes we are seeing alter her prognosis (possibly hours now) and the opportunity to transition to residential hospice may longer be a realistic possibility.  I reviewed her when necessary usage of rescue boluses and believe that her current rate and rescue bolus regimen seems appropriate. She was very comfortable on my examination this evening.  I will plan stop by and check on her again in the morning. At that time, will reassess to determine if she is even stable enough to consider transitioning to residential hospice.  I think there is a high likelihood that she will have a hospital death.  Time spent: 30 minutes Greater than 50%  of this time was spent counseling and coordinating care related to the above assessment and plan.  Micheline Rough, MD [place] [facility] [phone]

## 2016-10-20 NOTE — Progress Notes (Signed)
Palliative Medicine Progress Note  Reason for visit: Goals of care in light of metastatic cancer, pain management, non-pain symptom management, terminal care  I stopped by to check on Tracy Dodson this morning for symptom management and evaluation for possible transition to residential hospice. Her sister in law was also present in the room.  Her sister-in-law reports that she has been comfortable throughout the night. She did receive a couple boluses of morphine and the continuous rate was adjusted this morning by Dr. Posey Pronto to account for this.  She is no longer waking up in all.  She looks very comfortable my exam. She does not arouse to verbal or tactile stimulation. Her radial pulse is barely palpable and she is more tachycardic. She is having slight increase in length of periods of apnea. She is actively dying.  I discussed continued progression in the dying process as well as anticipatory guidance on what to expect moving forward with her sister-in-law. I also called and spoke with her husband and daughter on the phone.  I do not think that she is stable enough to consider transfer from the hospital to residential hospice. Will continue to follow her for symptom management and to reassess if her condition improves enough to consider transition from the hospital. I do not think this is going to be the case and she will have a hospital death.  Time spent: 25 minutes Greater than 50%  of this time was spent counseling and coordinating care related to the above assessment and plan.  Tracy Rough, MD [place] [facility] [phone]

## 2016-10-21 ENCOUNTER — Ambulatory Visit: Payer: PRIVATE HEALTH INSURANCE

## 2016-10-21 DIAGNOSIS — Z515 Encounter for palliative care: Secondary | ICD-10-CM

## 2016-10-22 ENCOUNTER — Ambulatory Visit: Payer: PRIVATE HEALTH INSURANCE

## 2016-10-22 ENCOUNTER — Ambulatory Visit: Payer: PRIVATE HEALTH INSURANCE | Admitting: Radiation Oncology

## 2016-10-22 LAB — CULTURE, BLOOD (ROUTINE X 2)
Culture: NO GROWTH
Culture: NO GROWTH

## 2016-10-22 NOTE — Progress Notes (Signed)
Expired Pt has left the floor.

## 2016-10-23 ENCOUNTER — Ambulatory Visit: Payer: PRIVATE HEALTH INSURANCE

## 2016-10-24 ENCOUNTER — Encounter: Payer: Self-pay | Admitting: Radiation Oncology

## 2016-10-24 ENCOUNTER — Ambulatory Visit: Payer: PRIVATE HEALTH INSURANCE

## 2016-10-24 NOTE — Progress Notes (Signed)
Called insurance company to let them know patient has passed so no paper work will be submitted for patient, [phone] ext [phone]

## 2016-10-25 ENCOUNTER — Ambulatory Visit: Payer: PRIVATE HEALTH INSURANCE

## 2016-10-28 ENCOUNTER — Ambulatory Visit: Payer: PRIVATE HEALTH INSURANCE

## 2016-10-28 ENCOUNTER — Other Ambulatory Visit: Payer: PRIVATE HEALTH INSURANCE

## 2016-10-29 ENCOUNTER — Ambulatory Visit: Payer: PRIVATE HEALTH INSURANCE

## 2016-10-30 ENCOUNTER — Ambulatory Visit: Payer: PRIVATE HEALTH INSURANCE

## 2016-10-30 ENCOUNTER — Ambulatory Visit
Admission: RE | Admit: 2016-10-30 | Payer: PRIVATE HEALTH INSURANCE | Source: Ambulatory Visit | Admitting: Radiation Oncology

## 2016-10-31 ENCOUNTER — Ambulatory Visit: Payer: PRIVATE HEALTH INSURANCE

## 2016-10-31 NOTE — Progress Notes (Signed)
[facility] Progress Note  Patient: Tracy Dodson YBO:[id number]   PCP: [name], MD DOB: [birth year]   DOA: 10/18/2016   DOS: 11/08/16   Date of Service: the patient was seen and examined on 11-08-2016  Brief hospital course: Pt. with PMH of NSCLC with bone and brain metastasis, on chemotherapy and radiation, HTN, PMR, mood disorder, recent right hip fracture and kyphoplasty T10; admitted on 10/30/2016, with complaint of confusion and difficulty speaking, was found to have acute encephalopathy as well as acute ischemic stroke and suspected sepsis. Currently further plan is Complete comfort care.Anticipate in-hospital death  Assessment and Plan: 1. Acute ischemic stroke Acute left MCA and PCA territory infarct embolic in nature. Management is difficult given thrombocytopenia. Patient was taking Apixaban for DVT prophylaxis dose despite which she has developed the stroke. Holding anticoagulation while the INR is more than 2. Antiplatelet would not be ideal given thrombocytopenia. Neurology consulted. PTOT and speech therapy consulted, patient on regular consistency diet per speech therapy although appears high risk for aspiration therefore nothing by mouth at present. Currently on comfort feeds Vascular Doppler shows minimal stenosis on the right. Echocardiogram shows 35-40% EF with wall motion abnormality in the anterolateral lateral and inferolateral area.  2. Acute encephalopathy. Etiology currently unclear and probably multifactorial. Acute stroke, possible seizures, metabolic encephalopathy with sepsis, polypharmacy, opiate withdrawal. At present EEG unremarkable for any focal seizures. MRI brain does not show any new metastatic lesions. Patient started on empiric IV antibiotics with vancomycin and Zosyn. Blood cultures performed.  3. sepsis. From Klebsiella UTI, present on admission. Patient with low-grade temperature, tachycardia, hypotension, tachypnea as well as acute  encephalopathy. Meeting sepsis criteria. Lactic acid normal. Chest x-ray clear. UA shows evidence of UTI, recently started on oral Bactrim as an outpatient. Coverage was broaden to vancomycin and Zosyn. Blood cultures negative.  4. Acute anemia, acute on chronic thrombocytopenia, leukocytosis. Etiology of these findings is currently unclear. Possibility of sepsis causing bicytopenia. Without any schistocytes TTP less likely, with normal fibrinogen HUS DIC less likely. Marginally low ADAMTS 13. S/P 2 units PRBC. H&H stable  5.elevated troponin. Sinus tachycardia. Suspected Ischemic cardiomyopathy Patient's troponin elevation as well as tachycardia is likely response to demand ischemia. Unable to use any anticoagulation or aspirin given severe thrombocytopenia as well as anemia. Echogram shows 35-40% EF with wall motion abnormalities concerning for possible ischemic cardiomyopathy.  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 Chronic pain syndrome. Currently given acute sepsis on holding patient's chemotherapy regimen. Discussed with patient's primary oncologist Dr. Julien Nordmann Patient was on chronic prednisone I would currently use IV Solu Cortef. Patient was on oral OxyContin 40 mg every 12 hours for pain control, equivalent dose of which would be ideally 50 mics of fentanyl patch   7. Goals of care discussion. Comfort care On admission patient was full code, I discussed with patient's family regarding their goals of care, family responded the patient has recently mentioned about being DO NOT RESUSCITATE. Husband at bedside agreed with that and CODE STATUS changed to DO NOT RESUSCITATE DO NOT INTUBATE. Patient with multiple comorbidities at present, suspected sepsis, acute encephalopathy. Has severe thrombo-cytopenia and anemia limiting use of antiplatelets as well as anticoagulation medication which is accessory for secondary stroke prevention in a  patient with acute stroke. Patient also has multisystem organ failure likely given elevated troponin levels. At this point patient with high likelihood of not surviving this hospitalization. even if patient survives this hospitalization long-term prognosis  is limited due to presence of non-small cell lung cancer and thrombocytopenia neurology as well as medical oncology both recommended palliative care consult. Appreciate palliative care input. After discussing with family, family has opted for complete comfort care approach for the patient. Patient would be ideally served in inpatient hospice facility, but currently unsafe to be transferred. Patient's breathing appears significantly labored, given her long-term opioid use patient was started on IV morphine infusion. She has not received any morphine bolus despite remaining dyspneic in the last 24 hours. I would increase infusion rate 5 mg per hour infusion  Nursing staff educated to use morphine bolus along with the infusion to improve patient's dyspnea and respiratory rate.  8. Hypokalemia. NSVT. Patient did have 2 prolonged NSVT is on telemetry. Avoiding Haldol in this setting. Also has elevated troponin as well as cardiomyopathy. Treatment options are limited for cardiomyopathy in the setting of anemia as well as thrombocytopenia. Family has decided to opt for complete comfort care.  Bowel regimen: last BM 10/18/2016 Diet:  Comfort care feeds  Advance goals of care discussion: see above, complete comfort  Family Communication: family was present at bedside, at the time of interview. The pt provided permission to discuss medical plan with the family. Opportunity was given to ask question and all questions were answered satisfactorily.   Disposition:  Discharge to inpatient hospice, but currently unsafe to be transferred. Expected discharge date: Nov 14, 2016 anticipate in-hospital death.  Consultants: Neurology, palliative care, phone  consultation with medical oncology Procedures: Echocardiogram, carotid Doppler   Antibiotics: Anti-infectives    Start     Dose/Rate Route Frequency Ordered Stop   10/19/16 0100  piperacillin-tazobactam (ZOSYN) IVPB 3.375 g  Status:  Discontinued     3.375 g 12.5 mL/hr over 240 Minutes Intravenous Every 8 hours 10/18/16 1732 10/19/16 0914   10/18/16 0400  vancomycin (VANCOCIN) IVPB 750 mg/150 ml premix  Status:  Discontinued     750 mg 150 mL/hr over 60 Minutes Intravenous Every 12 hours 10/25/2016 1443 10/19/16 0914   10/15/2016 2200  piperacillin-tazobactam (ZOSYN) IVPB 3.375 g  Status:  Discontinued     3.375 g 12.5 mL/hr over 240 Minutes Intravenous Every 8 hours 10/24/2016 1443 10/18/16 1732   10/29/2016 1300  piperacillin-tazobactam (ZOSYN) IVPB 3.375 g     3.375 g 100 mL/hr over 30 Minutes Intravenous  Once 10/03/2016 1248 10/09/2016 1426   10/27/2016 1300  vancomycin (VANCOCIN) IVPB 1000 mg/200 mL premix     1,000 mg 200 mL/hr over 60 Minutes Intravenous  Once 10/05/2016 1248 10/16/2016 1908   10/25/2016 1000  sulfamethoxazole-trimethoprim (BACTRIM DS,SEPTRA DS) 800-160 MG per tablet 1 tablet  Status:  Discontinued     1 tablet Oral 2 times daily 10/29/2016 0900 10/26/2016 1248      Subjective: Remains Lethargic, tachypneic and gasping for air. Objective: Physical Exam: Vitals:   10/20/16 1924 10/20/16 2011 10/20/16 2058 Nov 13, 2016 1450  BP:   (!) 86/46 (!) 71/35  Pulse:   (!) 126 (!) 111  Resp:   20 20  Temp: (!) 101.6 F (38.7 C) 98.7 F (37.1 C) 99.6 F (37.6 C) 98 F (36.7 C)  TempSrc: Axillary Axillary Oral Axillary  SpO2:   (!) 84%   Weight:      Height:        Intake/Output Summary (Last 24 hours) at 11-13-16 1731 Last data filed at 2016-11-13 0500  Gross per 24 hour  Intake            71.53 ml  Output                0 ml  Net            71.53 ml   Filed Weights   10/19/2016 0454  Weight: 56.3 kg (124 lb 1.9 oz)    General: Confused, non conversational, Appear in Moderate  distress, Cardiovascular: S1 and S2 Present, no Murmur, Respiratory: Bilateral Air entry equal and Decreased, bilateral Crackles, no wheezes Abdomen: Bowel Sound present, Extremities: trace Pedal edema, Neurologic: Confused, not following any command  Data Reviewed: CBC:  Recent Labs Lab 10/23/2016 0441 10/25/2016 0500 10/25/2016 1315 10/18/16 0510  WBC 38.9*  --  23.2* 25.8*  NEUTROABS 31.9*  --  16.1* 22.5*  HGB 7.5* 8.2* 6.1* 10.1*  HCT 24.8* 24.0* 20.0* 30.9*  MCV 86.4  --  86.2 85.4  PLT 43*  --  34* 28*   Basic Metabolic Panel:  Recent Labs Lab 10/22/2016 0441 10/09/2016 0500 10/26/2016 1315 10/18/16 0510  NA 131* 131* 133* 134*  K 3.6 3.6 3.5 2.9*  CL 92* 91* 97* 99*  CO2 21*  --  21* 21*  GLUCOSE 118* 123* 91 171*  BUN '10 12 9 6  '$ CREATININE 0.76 0.70 0.64 0.65  CALCIUM 9.3  --  8.9 8.6*  MG  --   --   --  1.4*    Liver Function Tests:  Recent Labs Lab 10/24/2016 0441 10/16/2016 1315 10/18/16 0510  AST 44* 41 44*  ALT 11* 11* 12*  ALKPHOS 87 82 83  BILITOT 1.3* 1.0 2.0*  PROT 6.6 5.9* 5.8*  ALBUMIN 3.3* 3.0* 2.7*   No results for input(s): LIPASE, AMYLASE in the last 168 hours. No results for input(s): AMMONIA in the last 168 hours. Coagulation Profile:  Recent Labs Lab 10/16/2016 0441 10/08/2016 1315 10/18/16 0653  INR 2.36 2.31 2.39   Cardiac Enzymes:  Recent Labs Lab 10/22/2016 1051 10/14/2016 1315 10/15/2016 1700 10/18/16 0510  TROPONINI 0.50* 0.42* 0.48* 1.24*   BNP (last 3 results) No results for input(s): PROBNP in the last 8760 hours.  CBG:  Recent Labs Lab 10/24/2016 0500  GLUCAP 124*    Studies: No results found.   Scheduled Meds: . sodium chloride   Intravenous Once  . fentaNYL  50 mcg Transdermal Q72H   Continuous Infusions: . morphine 5 mg/hr (November 05, 2016 1054)   PRN Meds: [DISCONTINUED] acetaminophen **OR** [DISCONTINUED] acetaminophen (TYLENOL) oral liquid 160 mg/5 mL **OR** acetaminophen, antiseptic oral rinse, [DISCONTINUED]  glycopyrrolate **OR** [DISCONTINUED] glycopyrrolate **OR** glycopyrrolate, [DISCONTINUED] haloperidol **OR** [DISCONTINUED] haloperidol **OR** haloperidol lactate, ketorolac, [DISCONTINUED] LORazepam **OR** [DISCONTINUED] LORazepam **OR** LORazepam, morphine injection, morphine, [DISCONTINUED] ondansetron **OR** ondansetron (ZOFRAN) IV, polyvinyl alcohol, sodium chloride flush  Time spent: 30 minutes  Author: Berle Mull, MD [facility] Pager: [phone] 11/05/2016 5:31 PM  If 7PM-7AM, please contact night-coverage at [email or web address], password [facility]

## 2016-10-31 NOTE — Progress Notes (Signed)
Palliative Medicine RN Note: follow up from visit by Dr Domingo Cocking this morning. Pt has slightly labored breathing and a RR of 24; RN will give bolus dose of morphine.   Daughter in law at bedside. She asked about pt's fingernails, toenails and ears turning purple. Discussed changes that occur near death, including fever and increased secretions (and their available medical interventions), as well as color changes, cooling, respiratory changes. DIL verbalized understanding and gratefulness for care by staff.   Plan f/u tomorrow am if pt still alive. Family has PMT contact information.  Marjie Skiff Anastasio Wogan, RN, BSN, [facility] 11/11/16 3:51 PM Cell [phone] 8:00-4:00 Monday-Friday Office [phone]

## 2016-10-31 NOTE — Progress Notes (Signed)
Pt died at 19:30 death was confirm by myself and Greta, on call practitioner informed death certificate has been signed, family member was around when pt passed and pt husband has been informed came around  said he will make a decision of what funeral home he wants to send the body to, [place] donor has been informed, pt body has been dressed and sent to the morgue by the nurse tech

## 2016-10-31 NOTE — Progress Notes (Addendum)
RN changed Morphine IV bag and wasted 9.50m. LClide Dales charge nurse was a witness. Patient continue to be on continue IV Morphine. Will continue to monitor.

## 2016-10-31 NOTE — Progress Notes (Signed)
Patient in bed; her RR - 24, slightly labored breathing; family at bedside. Patient is receiving bolus doses of Morphine per PRN order for increase discomfort and dyspnea with good effect. Will continue to monitor.

## 2016-10-31 NOTE — Progress Notes (Deleted)
Recurrent non-small cell lung cancer initially diagnosed as stage IIIA (T1b N2M0) suspicious for adenocarcinoma with EGFR mutation in exon 19 and negative ALK gene translocation, now with oligo metastasis involving the left upper lobe and right lower lobe, recent bone scan and thoracic MRI showing osseous metastasis.

## 2016-10-31 NOTE — Progress Notes (Signed)
Palliative Medicine Progress Note  Reason for visit: Goals of care in light of metastatic cancer, pain management, non-pain symptom management, terminal care  I stopped by to check on Ms. Toste this morning for symptom management.Her husband, friend, and sister in law were present in the room.  Continuous morphine rate was adjusted this morning by Dr. Posey Pronto.  Agree with change.  She looks very comfortable my exam. She does not arouse to verbal or tactile stimulation. Her radial pulse is barely palpable and she is more tachycardic. She is tachypnic. She is actively dying.  I discussed continued progression in the dying process as well as anticipatory guidance on what to expect moving forward with her family.  I do not think that she is stable enough to consider transfer from the hospital to residential hospice. Will continue to follow her for symptom management and to reassess if her condition improves enough to consider transition from the hospital. I do not think this is going to be the case and she will have a hospital death.  Time spent: 20 minutes Greater than 50%  of this time was spent counseling and coordinating care related to the above assessment and plan.  Micheline Rough, MD [place] Team [phone]

## 2016-10-31 NOTE — Discharge Summary (Signed)
[facility] Death Summary   Patient: Tracy Dodson FXT:[id number]   PCP: [name], MD DOB: [birth year]   Date of admission: 10/27/2016   Date and time of death: 11/01/16 7:30 Pm   Hospital Diagnoses:  Principal Problem:   Acute ischemic stroke [facility]) Active Problems:   Primary cancer of right middle lobe of lung ([place])   Pancytopenia ([place])   Brain metastasis ([place])   Hyponatremia   Essential hypertension   Malnutrition of moderate degree   Sepsis ([place])   Anemia of acute infection   Aphasia   Tachycardia   Acute cystitis without hematuria   Cerebrovascular accident (CVA) ([place])   Thrombocytopenia ([place])   Elevated INR   Leukocytosis   Elevated troponin   Terminal care   SOB (shortness of breath)   Palliative care encounter    History of present illness: As per the H and P dictated on admission, "Tracy GOODSPEED is a 66 y.o. 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  Caveat that all history is collected via EMS, as the patient is unable to provide history for herself.  The patient awoke at 3:30 AM, called out the staff at her rehabilitation facility, and was noted to be aphasic, so code stroke was called and she was transferred to the ER. It is suspected that her event happened before 11 PM, because of reports that she seemed "abnormal" (otherwise unspecified) at shift change."  Hospital Course:  On admission patient was full code, I discussed with patient's family regarding their goals of care, family responded the patient has recently mentioned about being DO NOT RESUSCITATE. Husband at bedside agreed with that and CODE STATUS changed to DO NOT RESUSCITATE DO NOT INTUBATE. Patient with multiple comorbidities at present, suspected sepsis, acute encephalopathy. Has severe thrombo-cytopenia and anemia limiting use of antiplatelets as well as anticoagulation  medication which is necessory for secondary stroke prevention in a patient with acute stroke. Patient also has multisystem organ failure likely given elevated troponin levels. At this point patient with high likelihood of not surviving this hospitalization. even if patient survives this hospitalization long-term prognosis is limited due to presence of non-small cell lung cancer and thrombocytopenia neurology as well as medical oncology both recommended palliative care consult. Appreciate palliative care input. After discussing with family, family has opted for complete comfort care approach for the patient. Patient would be ideally served in inpatient hospice facility, but was unsafe to be transferred. Patient's breathing appears significantly labored, given her long-term opioid use patient was started on IV morphine infusion.  The patient was pronounced deceased at 19:30, on Oct 31, 2016.  Other hospital problems in detail is as follows. 1. Acute ischemic stroke Acute left MCA and PCA territory infarct embolic in nature. Management is difficult given thrombocytopenia. Patient was taking Apixaban for DVT prophylaxis dose despite which she has developed the stroke. Holding anticoagulation while the INR is more than 2. Antiplatelet would not be ideal given thrombocytopenia. Neurology consulted. PTOT and speech therapy consulted, patient on regular consistency diet per speech therapy although appears high risk for aspiration therefore nothing by mouth at present. Currently on comfort feeds Vascular Doppler shows minimal stenosis on the right. Echocardiogram shows 35-40% EF with wall motion abnormality in the anterolateral lateral and inferolateral area.  2. Acute encephalopathy. Etiology currently unclear and probably multifactorial. Acute stroke, possible seizures, metabolic encephalopathy with sepsis, polypharmacy, opiate withdrawal. At present  EEG unremarkable for any focal seizures. MRI  brain does not show any new metastatic lesions. Patient started on empiric IV antibiotics with vancomycin and Zosyn. Blood cultures performed.  3. sepsis. From Klebsiella UTI, present on admission. Patient with low-grade temperature, tachycardia, hypotension, tachypnea as well as acute encephalopathy. Meeting sepsis criteria. Lactic acid normal. Chest x-ray clear. UA shows evidence of UTI, recently started on oral Bactrim as an outpatient. Coverage was broaden to vancomycin and Zosyn. Blood cultures negative.  4. Acute anemia, acute on chronic thrombocytopenia, leukocytosis. Etiology of these findings is currently unclear. Possibility of sepsis causing bicytopenia. Without any schistocytes TTP less likely, with normal fibrinogen HUS DIC less likely. Marginally low ADAMTS 13. S/P 2 units PRBC. H&H stable  5.elevated troponin. Sinus tachycardia. Suspected Ischemic cardiomyopathy Patient's troponin elevation as well as tachycardia is likely response to demand ischemia. Unable to use any anticoagulation or aspirin given severe thrombocytopenia as well as anemia. Echogram shows 35-40% EF with wall motion abnormalities concerning for possible ischemic cardiomyopathy.  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 Chronic pain syndrome. Currently given acute sepsis on holding patient's chemotherapy regimen. Discussed with patient's primary oncologist Dr. Julien Nordmann Patient was on chronic prednisone  Given IV Solu Cortef. Patient was on oral OxyContin 40 mg every 12 hours for pain control, equivalent dose of which would be ideally 50 mcg of fentanyl patch   Procedures and Results:  Echocardiogram  Carotid doppler   Consultations:  Neurology  Palliative care  Medical oncology   The results of significant diagnostics from this hospitalization (including imaging, microbiology, ancillary and laboratory) are listed below for reference.     Significant Diagnostic Studies: Mr Brain Wo Contrast  Result Date: 10/18/2016 CLINICAL DATA:  Altered mental status. EXAM: MRI HEAD WITHOUT CONTRAST TECHNIQUE: Multiplanar, multiecho pulse sequences of the brain and surrounding structures were obtained without intravenous contrast. COMPARISON:  07/22/2016 FINDINGS: Brain: Patchy acute infarct in the posterior left temporal and left occipital cortex and subjacent white matter. The largest area of confluent infarct measures up to 4 cm. Smaller cortical infarct in the high posterior left frontal region. These areas show early cytotoxic edema on FLAIR imaging. Cavity in the right occipital cortex related to treated metastatic disease. No abnormal signal or mass effect in this region. No hydrocephalus or midline shift. Vascular: Present flow voids at the carotid siphon level. There could be abnormal flow within a left insular vessel on 8:13, but indeterminate due to motion artifact. IMPRESSION: 1. Patchy moderate sized acute infarcts in the left MCA and PCA distributions. Patient has a large left posterior communicating artery by previous imaging. 2. Treated cerebral metastatic disease with right occipital cavity. 3. Limited exam due to patient condition. Only motion degraded diffusion and FLAIR imaging could be obtained. Electronically Signed   By: Monte Fantasia M.D.   On: 10/16/2016 08:58   Dg Chest Port 1 View  Result Date: 10/28/2016 CLINICAL DATA:  Tachycardia.  Evaluate for sepsis. EXAM: PORTABLE CHEST 1 VIEW COMPARISON:  September 20, 2016 FINDINGS: Evaluation is limited due to patient positioning with her head slumped over the upper chest. No pneumothorax. The cardiomediastinal silhouette is normal. A right Port-A-Cath remains in place. No pulmonary nodules, masses, or focal infiltrates identified on the limited study. IMPRESSION: The study is limited due to patient positioning. Stable right Port-A-Cath. No acute abnormalities seen within the lungs.  Electronically Signed   By: Dorise Bullion III M.D   On: 10/24/2016 16:29  Ir Vertebroplasty Cerv/thor Bx Inc Uni/bil Inc/inject/imaging  Result Date: 10/07/2016 INDICATION: Severe low back pain secondary to compression fracture at T10. Patient with metastatic lung carcinoma. EXAM: IR VERTEBROPLASTY CERVICOTHORACIC INJ<Procedure Description>IR VERTEBROPLASTY CERVICOTHORACIC INJ MEDICATIONS: As antibiotic prophylaxis, Ancef 2 g IV was ordered pre-procedure and administered intravenously within 1 hour of incision. ANESTHESIA/SEDATION: Moderate (conscious) sedation was employed during this procedure. A total of Versed 2 mg and Fentanyl 50 mcg was administered intravenously. Moderate Sedation Time: 25 minutes. The patient's level of consciousness and vital signs were monitored continuously by radiology nursing throughout the procedure under my direct supervision. FLUOROSCOPY TIME:  Fluoroscopy Time: 6 minutes 6 seconds (195 mGy) COMPLICATIONS: None immediate. TECHNIQUE: Informed written consent was obtained from the patient after a thorough discussion of the procedural risks, benefits and alternatives. All questions were addressed. Maximal Sterile Barrier Technique was utilized including caps, mask, sterile gowns, sterile gloves, sterile drape, hand hygiene and skin antiseptic. A timeout was performed prior to the initiation of the procedure. PROCEDURE: The patient was placed prone on the fluoroscopic table. Nasal oxygen was administered. Physiologic monitoring was performed throughout the duration of the procedure. The skin overlying the thoracic region was prepped and draped in the usual sterile fashion. The T10 vertebral body was identified and the right pedicle was infiltrated with 0.25% Bupivacaine. This was then followed by the advancement of a 13-gauge Cook needle through the right pedicle into the posterior one-third at T10. Two passes were then made with separate 16 gauge core biopsy needles. Tissues were  obtained using 20 mL syringe, and sent for pathologic analysis. The working needle was then advanced into the distal one-third. A gentle contrast injection demonstrated a trabecular pattern of contrast and opacification of paraspinous veins. This necessitated the use of Gel-Foam pledgets into the 13 gauge Cook spinal needle prior to the delivery of the methylmethacrylate mixture. At this time, methylmethacrylate mixture was reconstituted. Under biplane intermittent fluoroscopy, the methylmethacrylate was then injected into the T10 vertebral body with filling of the vertebral body. No extravasation was noted into the disk spaces or posteriorly into the spinal canal. No epidural venous contamination was seen. The needle was then removed. Hemostasis was achieved at the skin entry site. There were no acute complications. Patient tolerated the procedure well. The patient was observed for 3 hours and discharged in good condition. IMPRESSION: 1. Status post vertebral body augmentation for painful compression fracture at T10 using vertebroplasty technique. Core sample tissues were sent for pathologic analysis. The pathology report to be sent to the patient's oncologist. This was explained to the patient and the patient's spouse. Electronically Signed   By: Luanne Bras M.D.   On: 10/04/2016 10:42   Ct Head Code Stroke W/o Cm  Result Date: 10/03/2016 CLINICAL DATA:  Code stroke.  66 y/o  F; stroke-like symptoms. EXAM: CT HEAD WITHOUT CONTRAST TECHNIQUE: Contiguous axial images were obtained from the base of the skull through the vertex without intravenous contrast. COMPARISON:  07/22/2016 MRI head.  05/06/2006 CT head. FINDINGS: Brain: Motion degraded. Right occipital resection cavity stable from prior MRI given differences in technique. Wedge-shaped focus of hypoattenuation within the left temporal occipital junction with loss of gray-white differentiation (series 201, image 11) probably representing an area of  infarction. No definite acute intracranial hemorrhage identified. Vascular: Motion degraded at skullbase. Skull: Right occipital craniotomy changes. Sinuses/Orbits: Motion degraded. Other: None. ASPECTS [facility] Stroke Program Early CT Score) - Ganglionic level infarction (caudate, lentiform nuclei, internal capsule, insula, M1-M3 cortex): 7 -  Supraganglionic infarction (M4-M6 cortex): 3 Total score (0-10 with 10 being normal): 10 IMPRESSION: 1. Motion degraded study, suboptimal assessment for subtle infarct or hemorrhage. 2. Wedge-shaped focus of hypoattenuation within the left temporal occipital junction with loss of gray-white differentiation probably representing an area of acute infarction. 3. ASPECTS is 10 These results were called by telephone at the time of interpretation on 10/28/2016 at 4:56 am to Dr. Leonel Ramsay, who verbally acknowledged these results. Electronically Signed   By: Kristine Garbe M.D.   On: 10/09/2016 04:58    Microbiology: Recent Results (from the past 240 hour(s))  Urine culture     Status: Abnormal   Collection Time: 10/16/2016  5:41 AM  Result Value Ref Range Status   Specimen Description URINE, RANDOM  Final   Special Requests NONE  Final   Culture >=100,000 COLONIES/mL KLEBSIELLA PNEUMONIAE (A)  Final   Report Status 10/19/2016 FINAL  Final   Organism ID, Bacteria KLEBSIELLA PNEUMONIAE (A)  Final      Susceptibility   Klebsiella pneumoniae - MIC*    AMPICILLIN >=32 RESISTANT Resistant     CEFAZOLIN <=4 SENSITIVE Sensitive     CEFTRIAXONE <=1 SENSITIVE Sensitive     CIPROFLOXACIN <=0.25 SENSITIVE Sensitive     GENTAMICIN <=1 SENSITIVE Sensitive     IMIPENEM <=0.25 SENSITIVE Sensitive     NITROFURANTOIN 64 INTERMEDIATE Intermediate     TRIMETH/SULFA <=20 SENSITIVE Sensitive     AMPICILLIN/SULBACTAM 8 SENSITIVE Sensitive     PIP/TAZO <=4 SENSITIVE Sensitive     Extended ESBL NEGATIVE Sensitive     * >=100,000 COLONIES/mL KLEBSIELLA PNEUMONIAE  Culture,  blood (x 2)     Status: None (Preliminary result)   Collection Time: 10/23/2016  1:35 PM  Result Value Ref Range Status   Specimen Description BLOOD RIGHT WRIST  Final   Special Requests IN PEDIATRIC BOTTLE 2CC  Final   Culture NO GROWTH 4 DAYS  Final   Report Status PENDING  Incomplete  Culture, blood (x 2)     Status: None (Preliminary result)   Collection Time: 10/18/2016  1:48 PM  Result Value Ref Range Status   Specimen Description BLOOD LEFT FOREARM  Final   Special Requests BOTTLES DRAWN AEROBIC ONLY 5CC  Final   Culture NO GROWTH 4 DAYS  Final   Report Status PENDING  Incomplete     Labs: CBC:  Recent Labs Lab 10/09/2016 0441 10/29/2016 0500 10/25/2016 1315 10/18/16 0510  WBC 38.9*  --  23.2* 25.8*  NEUTROABS 31.9*  --  16.1* 22.5*  HGB 7.5* 8.2* 6.1* 10.1*  HCT 24.8* 24.0* 20.0* 30.9*  MCV 86.4  --  86.2 85.4  PLT 43*  --  34* 28*   Basic Metabolic Panel:  Recent Labs Lab 10/14/2016 0441 10/11/2016 0500 10/09/2016 1315 10/18/16 0510  NA 131* 131* 133* 134*  K 3.6 3.6 3.5 2.9*  CL 92* 91* 97* 99*  CO2 21*  --  21* 21*  GLUCOSE 118* 123* 91 171*  BUN '10 12 9 6  '$ CREATININE 0.76 0.70 0.64 0.65  CALCIUM 9.3  --  8.9 8.6*  MG  --   --   --  1.4*   Liver Function Tests:  Recent Labs Lab 10/03/2016 0441 10/06/2016 1315 10/18/16 0510  AST 44* 41 44*  ALT 11* 11* 12*  ALKPHOS 87 82 83  BILITOT 1.3* 1.0 2.0*  PROT 6.6 5.9* 5.8*  ALBUMIN 3.3* 3.0* 2.7*   No results for input(s): LIPASE, AMYLASE in the last  168 hours. No results for input(s): AMMONIA in the last 168 hours. Cardiac Enzymes:  Recent Labs Lab 10/02/2016 1051 10/09/2016 1315 10/16/2016 1700 10/18/16 0510  TROPONINI 0.50* 0.42* 0.48* 1.24*   BNP (last 3 results) No results for input(s): BNP in the last 8760 hours. CBG:  Recent Labs Lab 10/04/2016 0500  GLUCAP 124*   Time spent: 30 minutes  Signed:  Kevin Space  [facility] 10/22/2016, 10:48 AM

## 2016-10-31 DEATH — deceased

## 2016-11-01 ENCOUNTER — Ambulatory Visit: Payer: PRIVATE HEALTH INSURANCE

## 2017-01-09 ENCOUNTER — Other Ambulatory Visit: Payer: Self-pay | Admitting: Nurse Practitioner

## 2017-07-01 IMAGING — CT CT T SPINE W/O CM
1 series · 12 of 14 positions shown, 15 images · non-contrast
Comparison: CT scan of July 19, 2016.  Radiographs of same day.

CLINICAL DATA: Acute mid back pain.  History of lung cancer.

EXAM:
CT THORACIC SPINE WITHOUT CONTRAST
TECHNIQUE: Multidetector CT images of the thoracic were obtained using the
standard protocol without intravenous contrast.

[Series 3: t spine soft · axial · 0.36mm/px · z∈[+1251,+1512]mm · 12 of 103 slices shown, 15 images]
[im 8/103  soft-tissue]
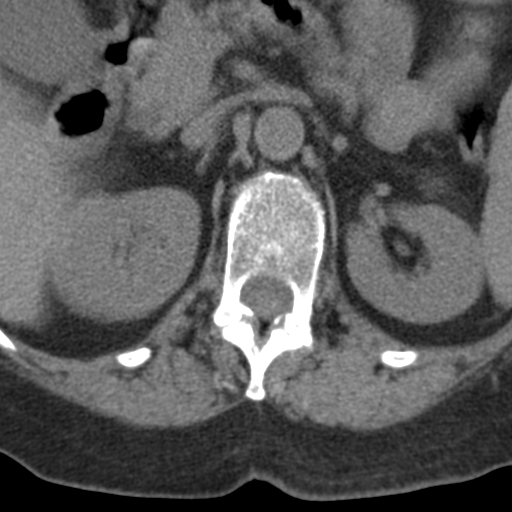
[im 8/103  bone]
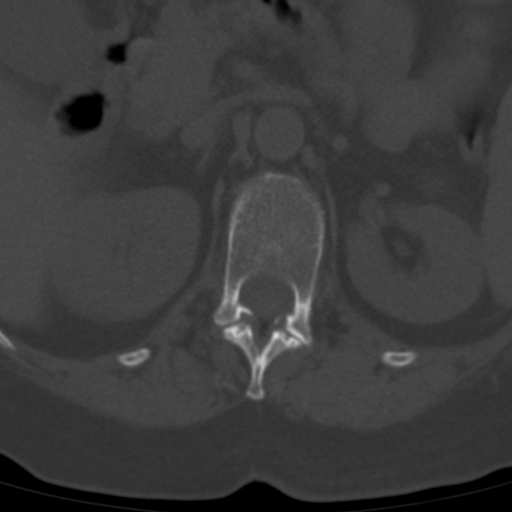
[im 16/103  bone]
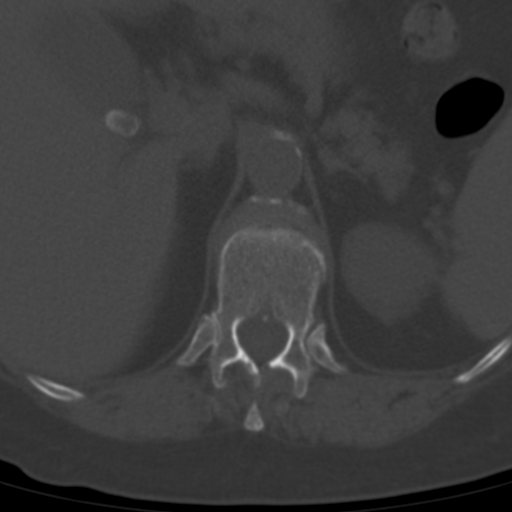
[im 24/103  bone]
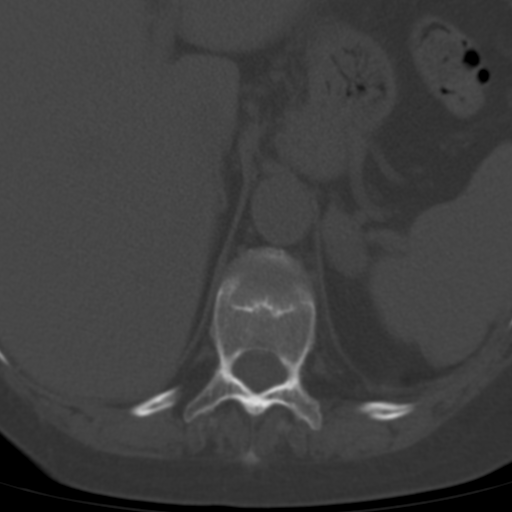
[im 32/103  bone]
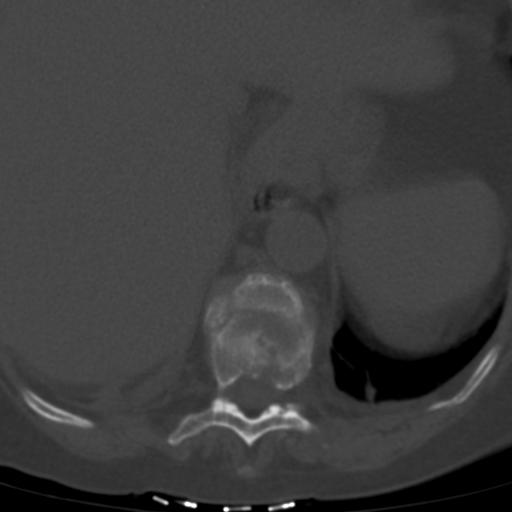
[im 40/103  soft-tissue]
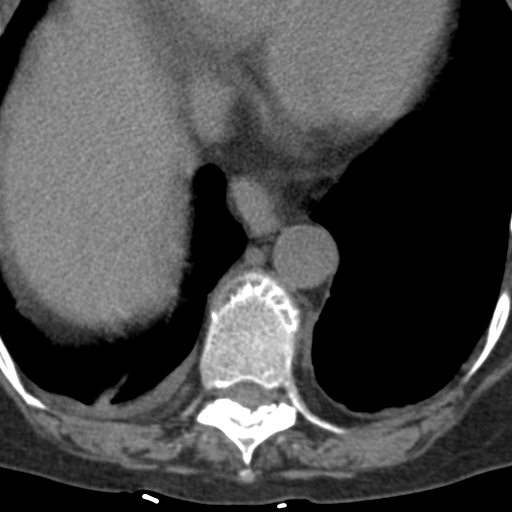
[im 40/103  bone]
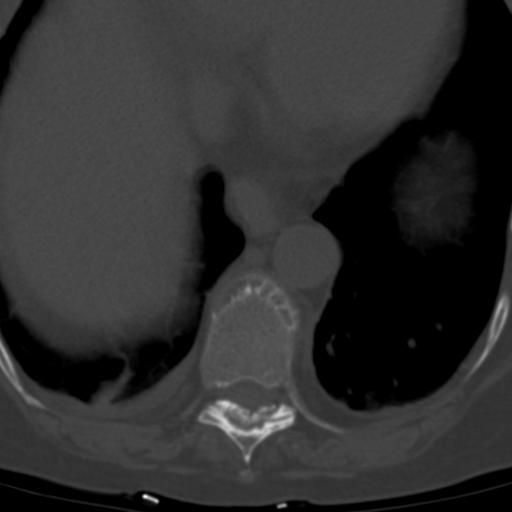
[im 48/103  bone]
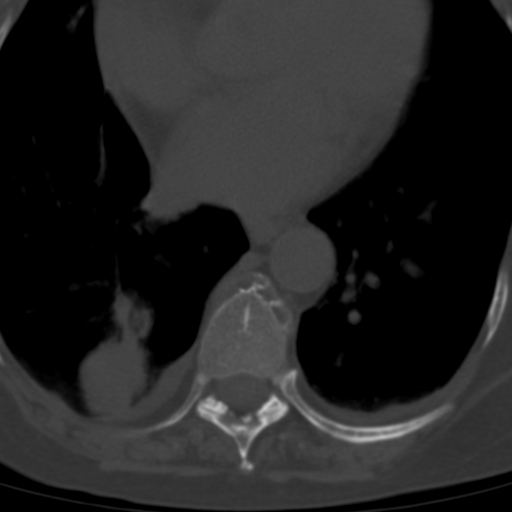
[im 55/103  bone]
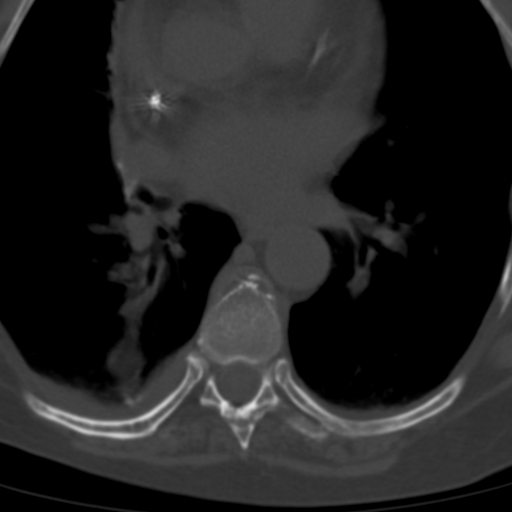
[im 63/103  bone]
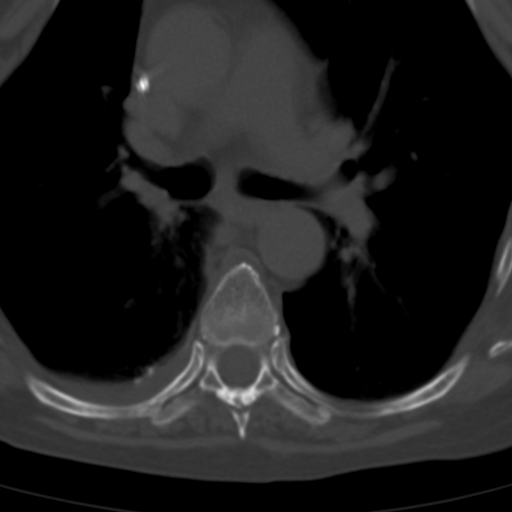
[im 71/103  soft-tissue]
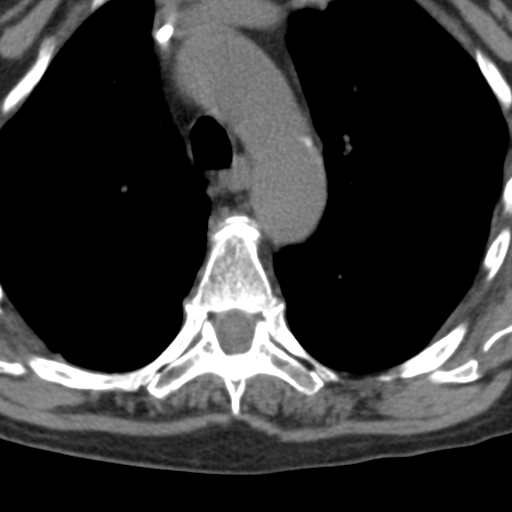
[im 71/103  bone]
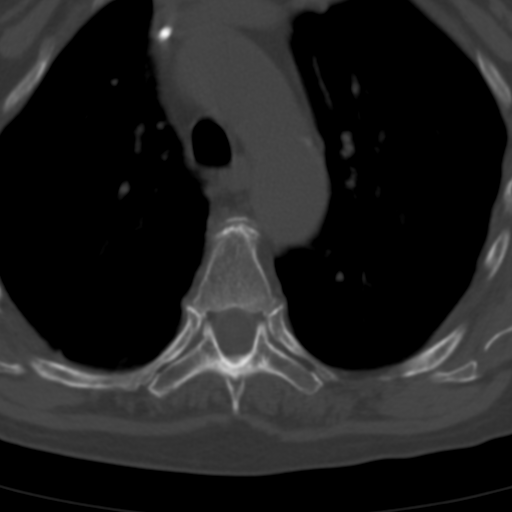
[im 79/103  bone]
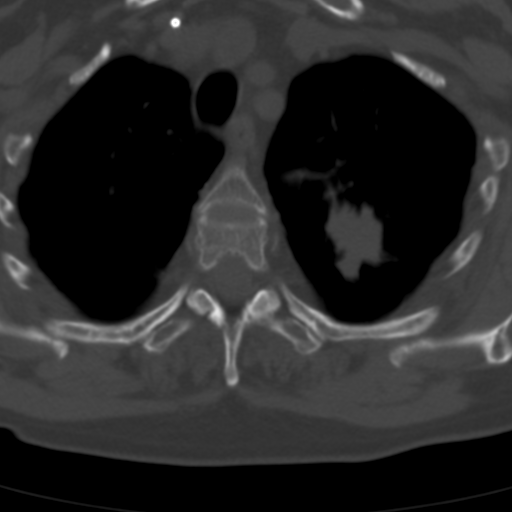
[im 87/103  bone]
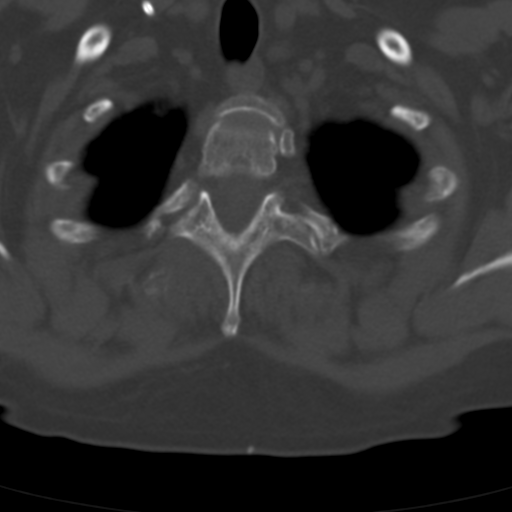
[im 95/103  bone]
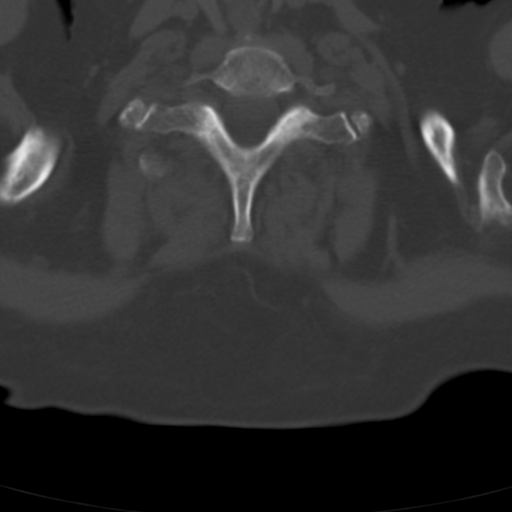

[12 of 14 positions shown; findings below may reference images not displayed]

FINDINGS: Alignment: No spondylolisthesis is noted.

Vertebrae: There is interval development of moderate compression
deformity of T10 vertebral body concerning for acute or subacute
fracture. There is no retropulsion of fragments into spinal canal.

Paraspinal and other soft tissues: Irregular left upper lobe mass is
significantly increased in size, measuring 2.4 x 2.1 cm currently.
Stable 3.0 x 2.5 cm right lower lobe mass is noted. No significant
central spinal canal stenosis is noted.

Disc levels: Multilevel degenerative disc disease is noted in the
lower thoracic spine.
IMPRESSION: Left upper lobe irregular mass is increased in size compared to
prior exam, concerning for worsening malignancy. Stable right lower
lobe mass is noted also concerning for malignancy or metastatic
disease.

Interval development of moderate compression deformity of T10
vertebral body concerning for acute or subacute fracture. Given the
history of malignancy, metastatic disease cannot be excluded,
although no significant lytic destruction is seen at this time. MRI
may be performed for further evaluation. These results will be
called to the ordering clinician or representative by the
Radiologist Assistant, and communication documented in the PACS or
[facility] Dashboard.

## 2017-07-01 IMAGING — CR DG THORACIC SPINE 3V
3 series · 3 of 3 positions shown · non-contrast
Comparison: Body CT 07/19/2016

CLINICAL DATA: Fall backwards onto radiation therapy table 3 days
ago. Initial encounter

EXAM:
THORACIC SPINE - 3 VIEWS

[w thoracic spine ap]
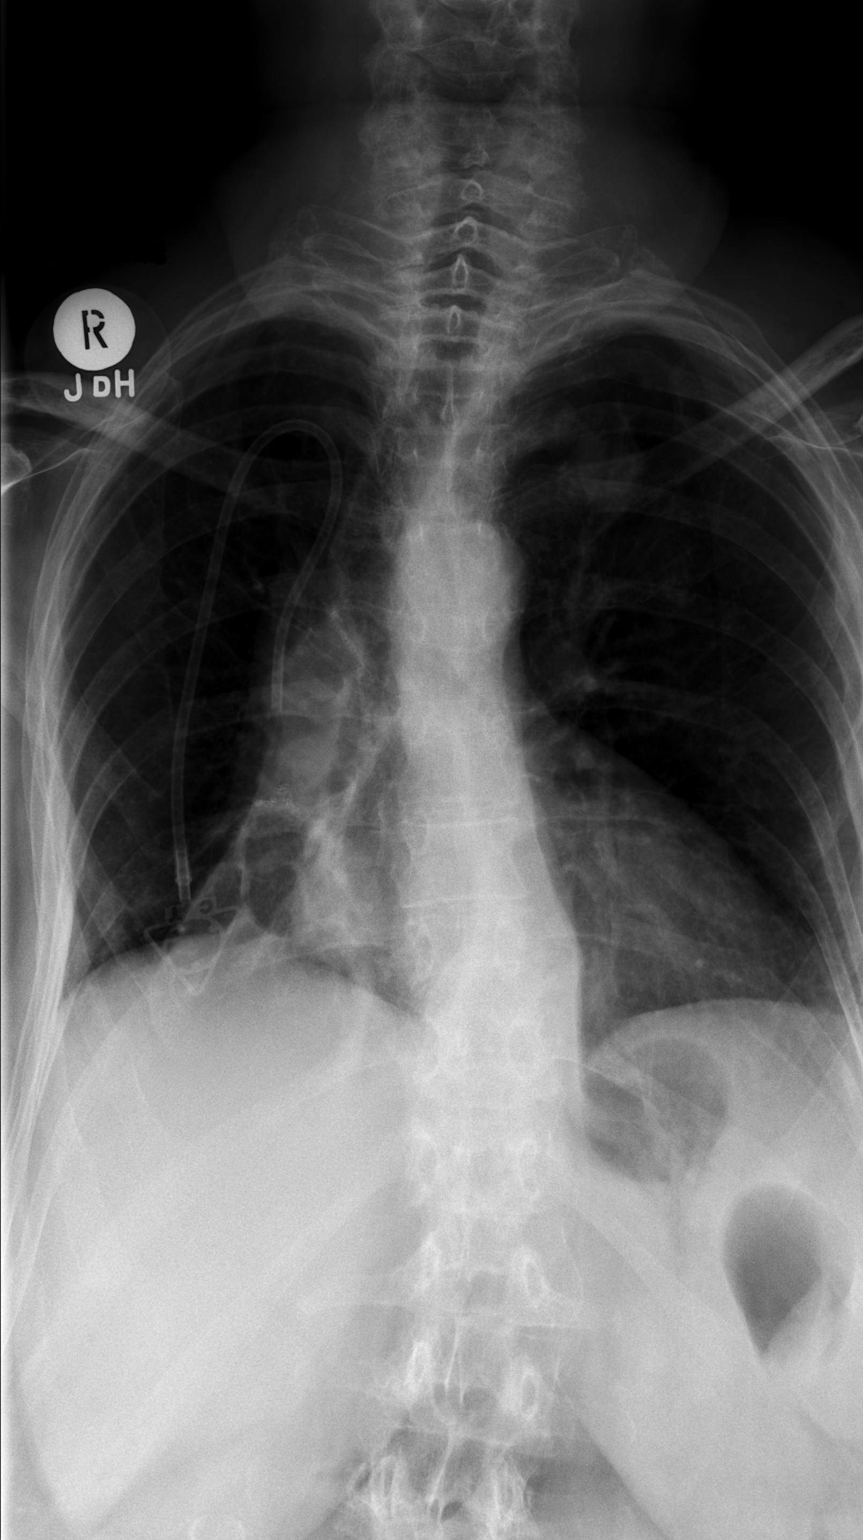

[w thoracic spine lat]
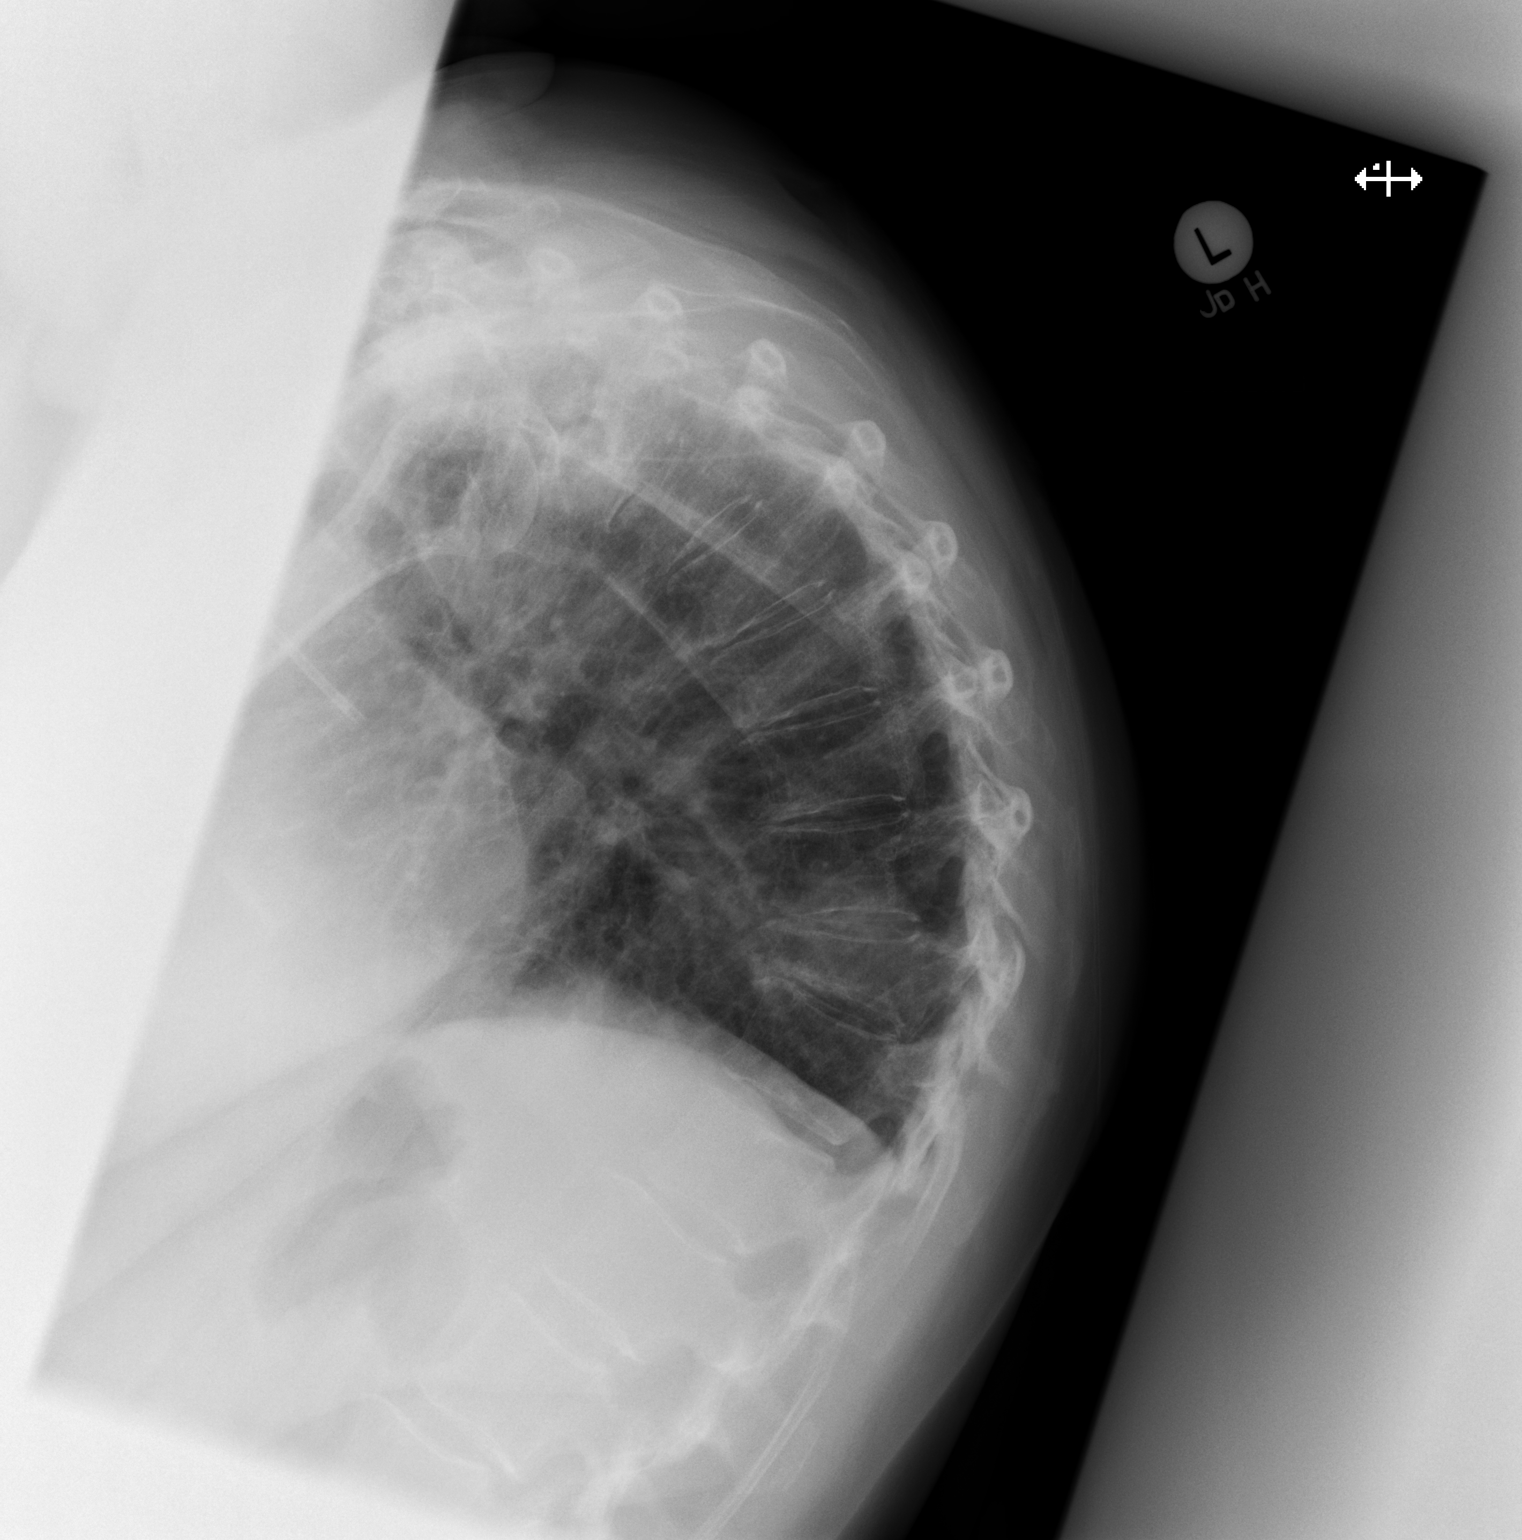

[w thoracic swimmers]
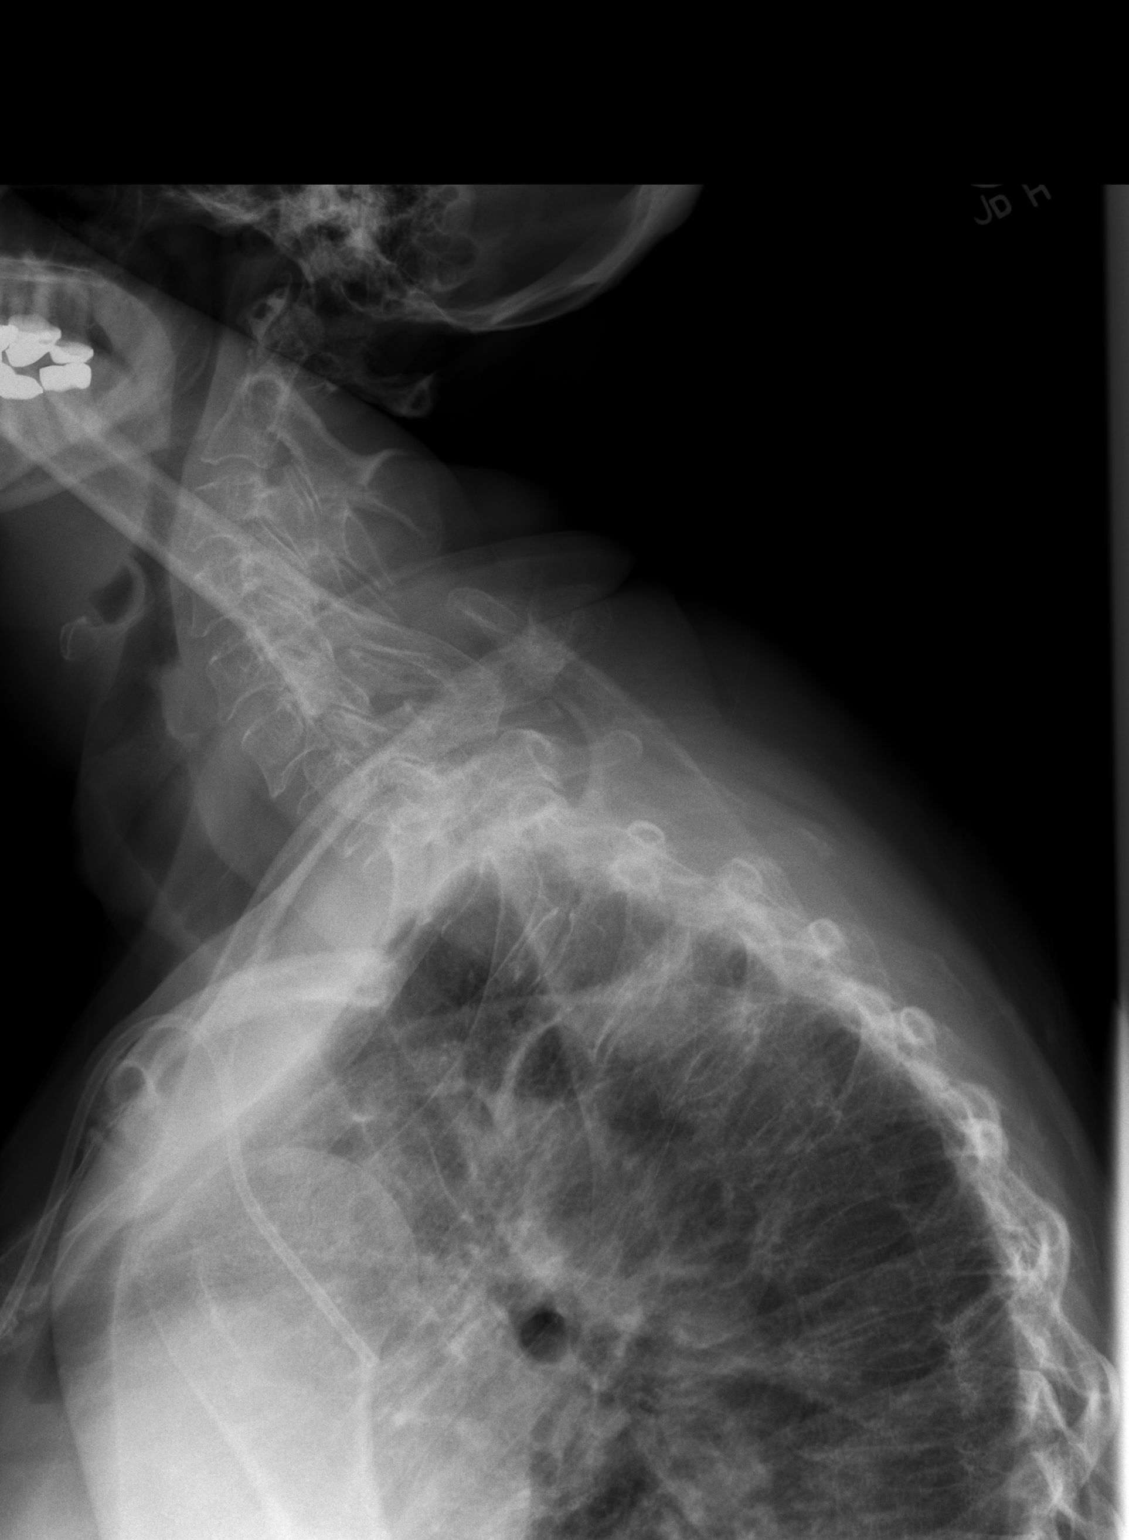

[3 of 3 positions shown; findings below may reference images not displayed]

FINDINGS: Lower thoracic compression fracture with moderate height loss, new
from 07/19/2016. The fracture is likely at the T10 level.

Bilateral pulmonary nodules, known.

Exaggerated thoracic kyphosis and generalized disc narrowing.
IMPRESSION: Lower thoracic (likely T10) moderate compression fracture that is
acute or subacute based development since 07/19/2016 CT.
